# Patient Record
Sex: Female | Born: 1980
Health system: Southern US, Community
[De-identification: ages and names within clinical notes are randomized; demographics above are authoritative.]

## PROBLEM LIST (undated history)

## (undated) DIAGNOSIS — F99 Mental disorder, not otherwise specified: Secondary | ICD-10-CM

## (undated) DIAGNOSIS — N200 Calculus of kidney: Secondary | ICD-10-CM

## (undated) DIAGNOSIS — I10 Essential (primary) hypertension: Secondary | ICD-10-CM

## (undated) DIAGNOSIS — E739 Lactose intolerance, unspecified: Secondary | ICD-10-CM

## (undated) DIAGNOSIS — E119 Type 2 diabetes mellitus without complications: Secondary | ICD-10-CM

## (undated) DIAGNOSIS — F319 Bipolar disorder, unspecified: Secondary | ICD-10-CM

## (undated) DIAGNOSIS — A599 Trichomoniasis, unspecified: Secondary | ICD-10-CM

## (undated) DIAGNOSIS — F209 Schizophrenia, unspecified: Secondary | ICD-10-CM

## (undated) DIAGNOSIS — F329 Major depressive disorder, single episode, unspecified: Secondary | ICD-10-CM

## (undated) DIAGNOSIS — Z6841 Body Mass Index (BMI) 40.0 and over, adult: Secondary | ICD-10-CM

## (undated) DIAGNOSIS — L98499 Non-pressure chronic ulcer of skin of other sites with unspecified severity: Secondary | ICD-10-CM

## (undated) DIAGNOSIS — T8859XA Other complications of anesthesia, initial encounter: Secondary | ICD-10-CM

## (undated) DIAGNOSIS — F32A Depression, unspecified: Secondary | ICD-10-CM

## (undated) DIAGNOSIS — M1990 Unspecified osteoarthritis, unspecified site: Secondary | ICD-10-CM

## (undated) DIAGNOSIS — G4730 Sleep apnea, unspecified: Secondary | ICD-10-CM

## (undated) DIAGNOSIS — Z8489 Family history of other specified conditions: Secondary | ICD-10-CM

## (undated) DIAGNOSIS — R51 Headache: Secondary | ICD-10-CM

## (undated) DIAGNOSIS — G43909 Migraine, unspecified, not intractable, without status migrainosus: Secondary | ICD-10-CM

## (undated) DIAGNOSIS — T4145XA Adverse effect of unspecified anesthetic, initial encounter: Secondary | ICD-10-CM

## (undated) DIAGNOSIS — G935 Compression of brain: Secondary | ICD-10-CM

## (undated) DIAGNOSIS — K219 Gastro-esophageal reflux disease without esophagitis: Secondary | ICD-10-CM

## (undated) DIAGNOSIS — D219 Benign neoplasm of connective and other soft tissue, unspecified: Secondary | ICD-10-CM

## (undated) DIAGNOSIS — K829 Disease of gallbladder, unspecified: Secondary | ICD-10-CM

## (undated) DIAGNOSIS — D649 Anemia, unspecified: Secondary | ICD-10-CM

## (undated) HISTORY — DX: Trichomoniasis, unspecified: A59.9

## (undated) HISTORY — DX: Type 2 diabetes mellitus without complications: E11.9

## (undated) HISTORY — DX: Sleep apnea, unspecified: G47.30

## (undated) HISTORY — PX: OTHER SURGICAL HISTORY: SHX169

## (undated) HISTORY — DX: Migraine, unspecified, not intractable, without status migrainosus: G43.909

## (undated) HISTORY — DX: Compression of brain: G93.5

## (undated) HISTORY — DX: Disease of gallbladder, unspecified: K82.9

## (undated) HISTORY — DX: Unspecified osteoarthritis, unspecified site: M19.90

## (undated) HISTORY — DX: Non-pressure chronic ulcer of skin of other sites with unspecified severity: L98.499

## (undated) HISTORY — DX: Lactose intolerance, unspecified: E73.9

## (undated) HISTORY — DX: Gastro-esophageal reflux disease without esophagitis: K21.9

## (undated) HISTORY — DX: Morbid (severe) obesity due to excess calories: E66.01

## (undated) HISTORY — DX: Body Mass Index (BMI) 40.0 and over, adult: Z684

---
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
## 2001-06-05 ENCOUNTER — Emergency Department (HOSPITAL_COMMUNITY): Admission: EM | Admit: 2001-06-05 | Discharge: 2001-06-05 | Payer: Self-pay | Admitting: Emergency Medicine

## 2001-08-18 ENCOUNTER — Emergency Department (HOSPITAL_COMMUNITY): Admission: EM | Admit: 2001-08-18 | Discharge: 2001-08-18 | Payer: Self-pay | Admitting: Emergency Medicine

## 2001-11-02 ENCOUNTER — Other Ambulatory Visit: Admission: RE | Admit: 2001-11-02 | Discharge: 2001-11-02 | Payer: Self-pay | Admitting: Family Medicine

## 2001-12-29 HISTORY — PX: MULTIPLE TOOTH EXTRACTIONS: SHX2053

## 2003-04-28 ENCOUNTER — Emergency Department (HOSPITAL_COMMUNITY): Admission: EM | Admit: 2003-04-28 | Discharge: 2003-04-28 | Payer: Self-pay | Admitting: Emergency Medicine

## 2004-02-01 ENCOUNTER — Other Ambulatory Visit: Admission: RE | Admit: 2004-02-01 | Discharge: 2004-02-01 | Payer: Self-pay | Admitting: Family Medicine

## 2004-05-10 ENCOUNTER — Encounter: Admission: RE | Admit: 2004-05-10 | Discharge: 2004-05-10 | Payer: Self-pay | Admitting: Family Medicine

## 2004-05-10 ENCOUNTER — Encounter (INDEPENDENT_AMBULATORY_CARE_PROVIDER_SITE_OTHER): Payer: Self-pay | Admitting: Specialist

## 2004-05-10 ENCOUNTER — Other Ambulatory Visit: Admission: RE | Admit: 2004-05-10 | Discharge: 2004-05-10 | Payer: Self-pay | Admitting: Family Medicine

## 2004-05-24 ENCOUNTER — Encounter: Admission: RE | Admit: 2004-05-24 | Discharge: 2004-05-24 | Payer: Self-pay | Admitting: Obstetrics and Gynecology

## 2004-06-29 ENCOUNTER — Emergency Department (HOSPITAL_COMMUNITY): Admission: EM | Admit: 2004-06-29 | Discharge: 2004-06-29 | Payer: Self-pay | Admitting: Emergency Medicine

## 2004-07-03 ENCOUNTER — Emergency Department (HOSPITAL_COMMUNITY): Admission: EM | Admit: 2004-07-03 | Discharge: 2004-07-03 | Payer: Self-pay | Admitting: Emergency Medicine

## 2004-09-06 ENCOUNTER — Ambulatory Visit: Payer: Self-pay | Admitting: Obstetrics and Gynecology

## 2004-10-14 ENCOUNTER — Emergency Department (HOSPITAL_COMMUNITY): Admission: EM | Admit: 2004-10-14 | Discharge: 2004-10-14 | Payer: Self-pay | Admitting: Emergency Medicine

## 2004-11-07 ENCOUNTER — Ambulatory Visit: Payer: Self-pay | Admitting: Obstetrics and Gynecology

## 2004-12-25 ENCOUNTER — Emergency Department (HOSPITAL_COMMUNITY): Admission: EM | Admit: 2004-12-25 | Discharge: 2004-12-25 | Payer: Self-pay | Admitting: Emergency Medicine

## 2005-01-21 ENCOUNTER — Inpatient Hospital Stay (HOSPITAL_COMMUNITY): Admission: AD | Admit: 2005-01-21 | Discharge: 2005-01-21 | Payer: Self-pay | Admitting: *Deleted

## 2005-02-07 ENCOUNTER — Inpatient Hospital Stay (HOSPITAL_COMMUNITY): Admission: AD | Admit: 2005-02-07 | Discharge: 2005-02-08 | Payer: Self-pay | Admitting: Family Medicine

## 2005-03-08 ENCOUNTER — Inpatient Hospital Stay (HOSPITAL_COMMUNITY): Admission: AD | Admit: 2005-03-08 | Discharge: 2005-03-09 | Payer: Self-pay | Admitting: Obstetrics and Gynecology

## 2005-04-07 ENCOUNTER — Ambulatory Visit (HOSPITAL_COMMUNITY): Admission: RE | Admit: 2005-04-07 | Discharge: 2005-04-07 | Payer: Self-pay | Admitting: Obstetrics & Gynecology

## 2005-04-16 ENCOUNTER — Observation Stay (HOSPITAL_COMMUNITY): Admission: AD | Admit: 2005-04-16 | Discharge: 2005-04-17 | Payer: Self-pay | Admitting: Obstetrics & Gynecology

## 2005-06-06 ENCOUNTER — Inpatient Hospital Stay (HOSPITAL_COMMUNITY): Admission: AD | Admit: 2005-06-06 | Discharge: 2005-06-06 | Payer: Self-pay | Admitting: Obstetrics

## 2005-06-16 ENCOUNTER — Inpatient Hospital Stay (HOSPITAL_COMMUNITY): Admission: AD | Admit: 2005-06-16 | Discharge: 2005-06-16 | Payer: Self-pay | Admitting: Obstetrics

## 2005-06-16 ENCOUNTER — Inpatient Hospital Stay (HOSPITAL_COMMUNITY): Admission: AD | Admit: 2005-06-16 | Discharge: 2005-06-20 | Payer: Self-pay | Admitting: Obstetrics

## 2005-08-29 ENCOUNTER — Inpatient Hospital Stay (HOSPITAL_COMMUNITY): Admission: AD | Admit: 2005-08-29 | Discharge: 2005-08-29 | Payer: Self-pay | Admitting: Obstetrics & Gynecology

## 2005-08-31 ENCOUNTER — Inpatient Hospital Stay (HOSPITAL_COMMUNITY): Admission: AD | Admit: 2005-08-31 | Discharge: 2005-08-31 | Payer: Self-pay | Admitting: Obstetrics & Gynecology

## 2005-09-05 ENCOUNTER — Inpatient Hospital Stay (HOSPITAL_COMMUNITY): Admission: AD | Admit: 2005-09-05 | Discharge: 2005-09-09 | Payer: Self-pay | Admitting: Obstetrics & Gynecology

## 2005-09-12 ENCOUNTER — Inpatient Hospital Stay (HOSPITAL_COMMUNITY): Admission: AD | Admit: 2005-09-12 | Discharge: 2005-09-12 | Payer: Self-pay | Admitting: Obstetrics & Gynecology

## 2007-02-24 ENCOUNTER — Emergency Department (HOSPITAL_COMMUNITY): Admission: EM | Admit: 2007-02-24 | Discharge: 2007-02-24 | Payer: Self-pay | Admitting: Emergency Medicine

## 2007-02-26 ENCOUNTER — Emergency Department (HOSPITAL_COMMUNITY): Admission: EM | Admit: 2007-02-26 | Discharge: 2007-02-27 | Payer: Self-pay | Admitting: Emergency Medicine

## 2007-03-01 ENCOUNTER — Emergency Department (HOSPITAL_COMMUNITY): Admission: EM | Admit: 2007-03-01 | Discharge: 2007-03-01 | Payer: Self-pay | Admitting: Family Medicine

## 2007-12-16 ENCOUNTER — Emergency Department (HOSPITAL_COMMUNITY): Admission: EM | Admit: 2007-12-16 | Discharge: 2007-12-16 | Payer: Self-pay | Admitting: Emergency Medicine

## 2008-01-14 ENCOUNTER — Emergency Department (HOSPITAL_COMMUNITY): Admission: EM | Admit: 2008-01-14 | Discharge: 2008-01-14 | Payer: Self-pay | Admitting: *Deleted

## 2008-12-29 DIAGNOSIS — N200 Calculus of kidney: Secondary | ICD-10-CM

## 2008-12-29 HISTORY — DX: Calculus of kidney: N20.0

## 2009-02-13 ENCOUNTER — Emergency Department (HOSPITAL_COMMUNITY): Admission: EM | Admit: 2009-02-13 | Discharge: 2009-02-13 | Payer: Self-pay | Admitting: Emergency Medicine

## 2009-02-22 ENCOUNTER — Emergency Department (HOSPITAL_COMMUNITY): Admission: EM | Admit: 2009-02-22 | Discharge: 2009-02-23 | Payer: Self-pay | Admitting: Emergency Medicine

## 2009-06-13 ENCOUNTER — Emergency Department (HOSPITAL_COMMUNITY): Admission: EM | Admit: 2009-06-13 | Discharge: 2009-06-13 | Payer: Self-pay | Admitting: Family Medicine

## 2009-07-09 ENCOUNTER — Emergency Department (HOSPITAL_COMMUNITY): Admission: EM | Admit: 2009-07-09 | Discharge: 2009-07-09 | Payer: Self-pay | Admitting: Emergency Medicine

## 2009-07-11 ENCOUNTER — Emergency Department (HOSPITAL_COMMUNITY): Admission: EM | Admit: 2009-07-11 | Discharge: 2009-07-12 | Payer: Self-pay | Admitting: Emergency Medicine

## 2009-09-24 ENCOUNTER — Emergency Department (HOSPITAL_COMMUNITY): Admission: EM | Admit: 2009-09-24 | Discharge: 2009-09-24 | Payer: Self-pay | Admitting: Emergency Medicine

## 2009-09-25 ENCOUNTER — Emergency Department (HOSPITAL_BASED_OUTPATIENT_CLINIC_OR_DEPARTMENT_OTHER): Admission: EM | Admit: 2009-09-25 | Discharge: 2009-09-25 | Payer: Self-pay | Admitting: Emergency Medicine

## 2009-09-25 ENCOUNTER — Ambulatory Visit: Payer: Self-pay | Admitting: Diagnostic Radiology

## 2009-12-25 ENCOUNTER — Ambulatory Visit (HOSPITAL_BASED_OUTPATIENT_CLINIC_OR_DEPARTMENT_OTHER): Admission: RE | Admit: 2009-12-25 | Discharge: 2009-12-25 | Payer: Self-pay | Admitting: General Surgery

## 2009-12-29 HISTORY — PX: TUBAL LIGATION: SHX77

## 2010-04-16 ENCOUNTER — Emergency Department (HOSPITAL_COMMUNITY): Admission: EM | Admit: 2010-04-16 | Discharge: 2010-04-16 | Payer: Self-pay | Admitting: Emergency Medicine

## 2010-04-16 ENCOUNTER — Inpatient Hospital Stay (HOSPITAL_COMMUNITY): Admission: AD | Admit: 2010-04-16 | Discharge: 2010-04-17 | Payer: Self-pay | Admitting: Obstetrics & Gynecology

## 2010-04-24 ENCOUNTER — Encounter: Payer: Self-pay | Admitting: Family Medicine

## 2010-04-24 ENCOUNTER — Inpatient Hospital Stay (HOSPITAL_COMMUNITY): Admission: AD | Admit: 2010-04-24 | Discharge: 2010-04-24 | Payer: Self-pay | Admitting: Family Medicine

## 2010-06-18 ENCOUNTER — Ambulatory Visit (HOSPITAL_COMMUNITY): Admission: RE | Admit: 2010-06-18 | Discharge: 2010-06-18 | Payer: Self-pay | Admitting: Obstetrics and Gynecology

## 2010-07-02 ENCOUNTER — Ambulatory Visit (HOSPITAL_COMMUNITY): Admission: RE | Admit: 2010-07-02 | Discharge: 2010-07-02 | Payer: Self-pay | Admitting: Obstetrics and Gynecology

## 2010-07-04 ENCOUNTER — Inpatient Hospital Stay (HOSPITAL_COMMUNITY): Admission: AD | Admit: 2010-07-04 | Discharge: 2010-07-04 | Payer: Self-pay | Admitting: Obstetrics and Gynecology

## 2010-07-04 ENCOUNTER — Ambulatory Visit: Payer: Self-pay | Admitting: Physician Assistant

## 2010-08-14 ENCOUNTER — Ambulatory Visit (HOSPITAL_COMMUNITY): Admission: RE | Admit: 2010-08-14 | Discharge: 2010-08-14 | Payer: Self-pay | Admitting: Obstetrics and Gynecology

## 2010-09-10 ENCOUNTER — Inpatient Hospital Stay (HOSPITAL_COMMUNITY): Admission: AD | Admit: 2010-09-10 | Discharge: 2010-09-10 | Payer: Self-pay | Admitting: Obstetrics and Gynecology

## 2010-09-11 ENCOUNTER — Ambulatory Visit (HOSPITAL_COMMUNITY): Admission: RE | Admit: 2010-09-11 | Discharge: 2010-09-11 | Payer: Self-pay | Admitting: Obstetrics and Gynecology

## 2010-10-01 ENCOUNTER — Inpatient Hospital Stay (HOSPITAL_COMMUNITY): Admission: AD | Admit: 2010-10-01 | Discharge: 2010-10-01 | Payer: Self-pay | Admitting: Obstetrics and Gynecology

## 2010-10-01 ENCOUNTER — Ambulatory Visit: Payer: Self-pay | Admitting: Gynecology

## 2010-12-13 ENCOUNTER — Encounter (INDEPENDENT_AMBULATORY_CARE_PROVIDER_SITE_OTHER): Payer: Self-pay | Admitting: Obstetrics and Gynecology

## 2010-12-13 ENCOUNTER — Inpatient Hospital Stay (HOSPITAL_COMMUNITY)
Admission: RE | Admit: 2010-12-13 | Discharge: 2010-12-16 | Payer: Self-pay | Source: Home / Self Care | Attending: Obstetrics and Gynecology | Admitting: Obstetrics and Gynecology

## 2011-01-19 ENCOUNTER — Encounter: Payer: Self-pay | Admitting: Obstetrics & Gynecology

## 2011-03-10 LAB — RPR: RPR Ser Ql: NONREACTIVE

## 2011-03-10 LAB — BASIC METABOLIC PANEL
CO2: 24 mEq/L (ref 19–32)
Chloride: 105 mEq/L (ref 96–112)
Creatinine, Ser: 0.61 mg/dL (ref 0.4–1.2)
GFR calc Af Amer: >60 mL/min (ref >60)
Potassium: 3.5 mEq/L (ref 3.5–5.1)
Sodium: 138 mEq/L (ref 135–145)

## 2011-03-10 LAB — CBC
HCT: 34.3 % — ABNORMAL LOW (ref 36.0–46.0)
MCH: 30.6 pg (ref 26.0–34.0)
MCHC: 33.4 g/dL (ref 30.0–36.0)
MCV: 88.9 fL (ref 78.0–100.0)
MCV: 91.5 fL (ref 78.0–100.0)
Platelets: 261 10*3/uL (ref 150–400)
RBC: 3.33 MIL/uL — ABNORMAL LOW (ref 3.87–5.11)
RBC: 3.75 MIL/uL — ABNORMAL LOW (ref 3.87–5.11)
RDW: 14.6 % (ref 11.5–15.5)
WBC: 8.6 10*3/uL (ref 4.0–10.5)

## 2011-03-10 LAB — SURGICAL PCR SCREEN: Staphylococcus aureus: POSITIVE — AB

## 2011-03-12 LAB — URINALYSIS, ROUTINE W REFLEX MICROSCOPIC
Glucose, UA: NEGATIVE mg/dL
Hgb urine dipstick: NEGATIVE
Protein, ur: NEGATIVE mg/dL
Specific Gravity, Urine: 1.025 (ref 1.005–1.030)
pH: 6 (ref 5.0–8.0)

## 2011-03-13 LAB — URINALYSIS, ROUTINE W REFLEX MICROSCOPIC
Bilirubin Urine: NEGATIVE
Hgb urine dipstick: NEGATIVE
Ketones, ur: 15 mg/dL — AB
Specific Gravity, Urine: 1.02 (ref 1.005–1.030)
Urobilinogen, UA: 1 mg/dL (ref 0.0–1.0)

## 2011-03-13 LAB — WET PREP, GENITAL
Trich, Wet Prep: NONE SEEN
Yeast Wet Prep HPF POC: NONE SEEN

## 2011-03-16 LAB — URINALYSIS, ROUTINE W REFLEX MICROSCOPIC
Bilirubin Urine: NEGATIVE
Glucose, UA: NEGATIVE mg/dL
Hgb urine dipstick: NEGATIVE
Protein, ur: NEGATIVE mg/dL
Specific Gravity, Urine: 1.02 (ref 1.005–1.030)

## 2011-03-18 LAB — CBC
Hemoglobin: 12.6 g/dL (ref 12.0–15.0)
MCHC: 34.1 g/dL (ref 30.0–36.0)
MCHC: 34.3 g/dL (ref 30.0–36.0)
Platelets: 348 10*3/uL (ref 150–400)
RDW: 14.8 % (ref 11.5–15.5)
RDW: 14.4 % (ref 11.5–15.5)

## 2011-03-18 LAB — URINALYSIS, ROUTINE W REFLEX MICROSCOPIC
Bilirubin Urine: NEGATIVE
Hgb urine dipstick: NEGATIVE
Ketones, ur: NEGATIVE mg/dL
Nitrite: NEGATIVE
Protein, ur: NEGATIVE mg/dL
Protein, ur: NEGATIVE mg/dL
Specific Gravity, Urine: 1.025 (ref 1.005–1.030)
Urobilinogen, UA: 0.2 mg/dL (ref 0.0–1.0)
Urobilinogen, UA: 0.2 mg/dL (ref 0.0–1.0)

## 2011-03-18 LAB — DIFFERENTIAL
Basophils Absolute: 0 10*3/uL (ref 0.0–0.1)
Basophils Relative: 0 % (ref 0–1)
Eosinophils Relative: 1 % (ref 0–5)
Monocytes Absolute: 0.5 10*3/uL (ref 0.1–1.0)
Neutro Abs: 7.4 10*3/uL (ref 1.7–7.7)

## 2011-03-18 LAB — BASIC METABOLIC PANEL
CO2: 27 mEq/L (ref 19–32)
Calcium: 9.1 mg/dL (ref 8.4–10.5)
Glucose, Bld: 85 mg/dL (ref 70–99)
Sodium: 136 mEq/L (ref 135–145)

## 2011-03-18 LAB — WET PREP, GENITAL
Trich, Wet Prep: NONE SEEN
Yeast Wet Prep HPF POC: NONE SEEN

## 2011-03-18 LAB — HCG, QUANTITATIVE, PREGNANCY: hCG, Beta Chain, Quant, S: 6347 m[IU]/mL — ABNORMAL HIGH (ref <5)

## 2011-03-18 LAB — GC/CHLAMYDIA PROBE AMP, GENITAL: GC Probe Amp, Genital: NEGATIVE

## 2011-03-31 LAB — POCT HEMOGLOBIN-HEMACUE: Hemoglobin: 11.3 g/dL — ABNORMAL LOW (ref 12.0–15.0)

## 2011-04-06 LAB — DIFFERENTIAL
Basophils Absolute: 0 10*3/uL (ref 0.0–0.1)
Eosinophils Relative: 4 % (ref 0–5)
Monocytes Absolute: 1.2 10*3/uL — ABNORMAL HIGH (ref 0.1–1.0)
Monocytes Relative: 9 % (ref 3–12)
Neutrophils Relative %: 63 % (ref 43–77)

## 2011-04-06 LAB — CBC
HCT: 38.3 % (ref 36.0–46.0)
Hemoglobin: 12.9 g/dL (ref 12.0–15.0)
MCHC: 33.5 g/dL (ref 30.0–36.0)
MCV: 91.1 fL (ref 78.0–100.0)
RBC: 4.21 MIL/uL (ref 3.87–5.11)

## 2011-04-06 LAB — URINALYSIS, ROUTINE W REFLEX MICROSCOPIC
Bilirubin Urine: NEGATIVE
Hgb urine dipstick: NEGATIVE
Specific Gravity, Urine: 1.031 — ABNORMAL HIGH (ref 1.005–1.030)
Urobilinogen, UA: 1 mg/dL (ref 0.0–1.0)
pH: 6.5 (ref 5.0–8.0)

## 2011-04-06 LAB — COMPREHENSIVE METABOLIC PANEL
BUN: 8 mg/dL (ref 6–23)
CO2: 21 mEq/L (ref 19–32)
Calcium: 9 mg/dL (ref 8.4–10.5)
Creatinine, Ser: 0.81 mg/dL (ref 0.4–1.2)
GFR calc non Af Amer: >60 mL/min (ref >60)
Glucose, Bld: 86 mg/dL (ref 70–99)

## 2011-04-06 LAB — LIPASE, BLOOD: Lipase: 19 U/L (ref 11–59)

## 2011-04-15 LAB — BASIC METABOLIC PANEL
CO2: 27 mEq/L (ref 19–32)
Chloride: 102 mEq/L (ref 96–112)
GFR calc Af Amer: >60 mL/min (ref >60)
Glucose, Bld: 71 mg/dL (ref 70–99)
Sodium: 136 mEq/L (ref 135–145)

## 2011-04-15 LAB — DIFFERENTIAL
Basophils Absolute: 0.1 10*3/uL (ref 0.0–0.1)
Basophils Relative: 1 % (ref 0–1)
Eosinophils Absolute: 0.3 10*3/uL (ref 0.0–0.7)
Eosinophils Relative: 3 % (ref 0–5)
Monocytes Absolute: 0.7 10*3/uL (ref 0.1–1.0)
Monocytes Relative: 7 % (ref 3–12)

## 2011-04-15 LAB — URINALYSIS, ROUTINE W REFLEX MICROSCOPIC
Glucose, UA: NEGATIVE mg/dL
Ketones, ur: NEGATIVE mg/dL
Nitrite: NEGATIVE
Protein, ur: NEGATIVE mg/dL
pH: 7 (ref 5.0–8.0)

## 2011-04-15 LAB — CBC
HCT: 43.6 % (ref 36.0–46.0)
Hemoglobin: 14.8 g/dL (ref 12.0–15.0)
MCHC: 33.8 g/dL (ref 30.0–36.0)
MCV: 92.2 fL (ref 78.0–100.0)
RBC: 4.73 MIL/uL (ref 3.87–5.11)
RDW: 14 % (ref 11.5–15.5)

## 2011-04-15 LAB — WET PREP, GENITAL
Clue Cells Wet Prep HPF POC: NONE SEEN
Trich, Wet Prep: NONE SEEN
WBC, Wet Prep HPF POC: NONE SEEN

## 2011-04-15 LAB — URINE MICROSCOPIC-ADD ON

## 2011-04-15 LAB — POCT PREGNANCY, URINE: Preg Test, Ur: POSITIVE

## 2011-04-15 LAB — GC/CHLAMYDIA PROBE AMP, GENITAL: Chlamydia, DNA Probe: NEGATIVE

## 2011-05-16 NOTE — Op Note (Signed)
Melinda Ruiz, Melinda Ruiz            ACCOUNT NO.:  [id number]   MEDICAL RECORD NO.:  [id number]          PATIENT TYPE:  INP   LOCATION:  9143                          FACILITY:  [facility]   PHYSICIAN:  Kathreen Cosier, M.D.DATE OF BIRTH:  [birth year]   DATE OF PROCEDURE:  09/06/2005  DATE OF DISCHARGE:                                 OPERATIVE REPORT   PREOPERATIVE DIAGNOSIS:  Failure to progress in labor.   SURGEON:  Kathreen Cosier, M.D.   ANESTHESIA:  Epidural.   PROCEDURE:  The patient was placed on the operating room table in the supine  position, abdomen prepped and draped, bladder emptied with a Foley catheter.  A transverse suprapubic incision made, carried through the rectus fascia.  The fascia was cleaned and incised the length of the incision.  The rectus  muscles were retracted laterally.  Peritoneum was incised longitudinally.  A  transverse incision was made in the viscera of the peritoneum above the  bladder, and bladder mobilized inferiorly.  A transverse lower intrauterine  incision was made, and the patient delivered, from an OP position, a female,  Apgar 8 and 9, weighing 7 pounds 6 ounces.  Placenta was anterior, was  removed manually.  The uterine cavity cleaned with dry laps, and uterine  incision closed with one layer of continuous suture of #1 chromic.  Hemostasis was satisfactory with #2 chromic.  The uterus was well  contracted, tubes and ovaries normal.  The abdomen closed in layers.  Peritoneum with continuous suture of 0 chromic, fascia was sutured with  Dexon, skin closed with subcuticular stitch of 4-0 Monocryl.   ESTIMATED BLOOD LOSS:  500 mL.           ______________________________  Kathreen Cosier, M.D.     BAM/MEDQ  D:  09/06/2005  T:  09/06/2005  Job:  [id number]

## 2011-05-16 NOTE — Discharge Summary (Signed)
NAMEPRESTON, Melinda Ruiz            ACCOUNT NO.:  [id number]   MEDICAL RECORD NO.:  [id number]          PATIENT TYPE:  INP   LOCATION:  9143                          [facility]   PHYSICIAN:  Charles A. Clearance Coots, M.D.DATE OF BIRTH:  10-03-1981   DATE OF ADMISSION:  09/05/2005  DATE OF DISCHARGE:  09/09/2005                                 DISCHARGE SUMMARY   The patient was admitted on September 05, 2005, in active labor.  At time of  admission the patient was determined to be full-term with intrauterine  pregnancy and dilated 6-7 cm, 100%, vertex at a -1, and artificial rupture  of membranes was performed by Dr. Francoise Ceo with clear fluid being  expressed after rupture of membranes.  After several hours after admission  the patient did not progress past 6 cm, and primary C-section was performed  by Dr. Francoise Ceo with a low transverse incision for failure to  progress.  The patient's hospital stay was uneventful.  Postoperative day  #1, the patient was found to have normal vital signs, abdomen soft, legs  were reactive.  Postoperative day #2, again vital signs were stable and the  patient was afebrile.  The fundus was appropriately tender after C-section  and firm.  The incision was clean, dry, and intact.  The patient was deemed  to be doing well.  The patient was discharged on hospital day #3.  Date of  discharge was September 09, 2005.  The patient had no complaints and desired  discharge home.  The patient's vital signs were stable and the patient was  afebrile.  Physical exam was nonremarkable.  Uterus was firm and nontender.  Incision sutures were clean, dry, and intact.  The patient had moderate  vaginal drainage, appropriate, and extremities were found to be negative  Homans sign and reactive.   DISCHARGE DIAGNOSIS:  Three days status post primary cesarean section for  failure to progress.   The patient was discharged home with infant on hospital day #3.  Prescriptions were given for ibuprofen and Percocet for p.r.n. use.  Engorgement and infection precautions were reviewed with the patient before  discharge, and the patient was instructed to follow up two weeks after  discharge for incision check at the [facility].      Maylon Cos, C.N.M.      Charles A. Clearance Coots, M.D.  Electronically Signed    SS/MEDQ  D:  10/17/2005  T:  10/17/2005  Job:  [id number]

## 2011-05-16 NOTE — Discharge Summary (Signed)
NAMEDUANE, Melinda Ruiz            ACCOUNT NO.:  [id number]   MEDICAL RECORD NO.:  [id number]          PATIENT TYPE:  INP   LOCATION:  9156                          [facility]   PHYSICIAN:  Charles A. Clearance Coots, M.D.DATE OF BIRTH:  05-26-1981   DATE OF ADMISSION:  06/16/2005  DATE OF DISCHARGE:  06/20/2005                                 DISCHARGE SUMMARY   ADMISSION DIAGNOSES:  1.  Intrauterine pregnancy at 28 weeks' gestation.  2.  Preterm cervical changes.   DISCHARGE DIAGNOSES:  1.  Intrauterine pregnancy at 28 weeks' gestation.  2.  Preterm cervical changes, much improved after intravenous tocolysis and      intravenous antibiotic therapy.   DISPOSITION:  Discharged to home, undelivered at 43 weeks' gestation, in  good condition.   REASON FOR ADMISSION:  A 30 year old, African-American female, G1 with  estimated date of confinement of September 08, 2005, presented with a  complaint of increased back pain and tightening overnight.  The morning of  her presentation, the patient complained of brownish vaginal discharge.   PAST SURGICAL HISTORY:  1.  Pilonidal cyst.  2.  Resection of boils under arm.   PAST MEDICAL HISTORY:  None.   MEDICATIONS:  Prenatal vitamins.   ALLERGIES:  PENICILLIN causes a rash.   SOCIAL HISTORY:  Single.  Negative for tobacco, alcohol or recreational drug  use.   PHYSICAL EXAMINATION:  GENERAL:  Well-developed, well-nourished female in no  acute distress.  VITAL SIGNS:  Temperature 97.8, blood pressure 112/74.  LUNGS:  Clear to auscultation bilaterally.  HEART:  Regular rate and rhythm.  ABDOMEN:  Gravid, nontender.  PELVIC:  Brownish mucus discharge on speculum exam in vaginal vault from  cervical os.  On bimanual exam, the cervix is closed, soft, 50%, vertex at a  -2 station.   IMPRESSION:  Intrauterine pregnancy at 79 weeks' gestation with preterm  cervical changes.   PLAN:  Admit for bed rest.  IV tocolysis.   LABORATORY DATA  AND X-RAY FINDINGS:  Hemoglobin 13.5, hematocrit 40, white  blood cell count 9900, platelets 323,000.  Comprehensive metabolic panel was  within normal limits.  Urinalysis revealed 3+ ketones and trace blood.   HOSPITAL COURSE:  The patient was admitted and started on IV tocolysis with  magnesium sulfate and IV antibiotic therapy.  She responded well to therapy.  Ultrasound on hospital day #3, revealed cervical length of 4 cm  transabdominally.  Amniotic fluid was normal and appropriate growth.  The  patient continued to do well during her hospitalization and was therefore  discharged to home on hospital day #4, much improved, undelivered at 28  weeks' gestation.   DISCHARGE LABORATORY DATA AND X-RAY FINDINGS:  Hemoglobin 12, hematocrit 36,  white blood cell count 8300, platelets 311,000.  Fetal fibronectin test was  negative.  Group B Streptococcus culture was positive.   DISCHARGE MEDICATIONS:  Continue prenatal vitamins.   SPECIAL INSTRUCTIONS:  Routine written instructions were given for preterm  labor precautions.  The patient is to call the office for a follow-up  appointment in 1 week.  CAH/MEDQ  D:  07/15/2005  T:  07/15/2005  Job:  [id number]

## 2011-05-16 NOTE — H&P (Signed)
Melinda Ruiz, Melinda Ruiz            ACCOUNT NO.:  [id number]   MEDICAL RECORD NO.:  [id number]          PATIENT TYPE:  INP   LOCATION:  9143                          [facility]ILITY[facility]   PHYSICIAN:  Kathreen Cosier, M.D.DATE OF BIRTH:  1981-02-14   DATE OF ADMISSION:  09/05/2005  DATE OF DISCHARGE:                                HISTORY & PHYSICAL   The patient is a 30 year old primigravida, [facility] September 08, 2005, positive  GBS, _________ labor and by 4:10 a.m. September 06, 2005, she was __________.  Membranes were ruptured.  The fluid was clear.  ___________.  She also  __________ GBS.  The patient was in good labor and by 11:50 a.m. her cervix  had not changed.  She ___________.  It was decided she would be delivered by  C-section for failure to progress __________.   PHYSICAL EXAMINATION:  GENERAL:  Revealed an obese female in labor.  HEENT:  Negative.  LUNGS:  Clear.  HEART:  Regular rhythm without gallops.  ABDOMEN:  Term.  Estimated fetal weight 8 pounds.  BREASTS:  ___________.  EXTREMITIES:  Negative.           ______________________________  Kathreen Cosier, M.D.     BAM/MEDQ  D:  09/06/2005  T:  09/06/2005  Job:  [id number]

## 2011-05-24 ENCOUNTER — Emergency Department (HOSPITAL_COMMUNITY)
Admission: EM | Admit: 2011-05-24 | Discharge: 2011-05-24 | Disposition: A | Discharge status: Home / Self Care | Payer: Medicare Other | Attending: Emergency Medicine | Admitting: Emergency Medicine

## 2011-05-24 ENCOUNTER — Emergency Department (HOSPITAL_COMMUNITY): Payer: Medicare Other

## 2011-05-24 DIAGNOSIS — Y92410 Unspecified street and highway as the place of occurrence of the external cause: Secondary | ICD-10-CM | POA: Insufficient documentation

## 2011-05-24 DIAGNOSIS — R071 Chest pain on breathing: Secondary | ICD-10-CM | POA: Insufficient documentation

## 2011-05-24 DIAGNOSIS — S0990XA Unspecified injury of head, initial encounter: Secondary | ICD-10-CM | POA: Insufficient documentation

## 2011-05-24 DIAGNOSIS — S139XXA Sprain of joints and ligaments of unspecified parts of neck, initial encounter: Secondary | ICD-10-CM | POA: Insufficient documentation

## 2011-05-24 DIAGNOSIS — R51 Headache: Secondary | ICD-10-CM | POA: Insufficient documentation

## 2011-05-24 DIAGNOSIS — I10 Essential (primary) hypertension: Secondary | ICD-10-CM | POA: Insufficient documentation

## 2011-06-29 ENCOUNTER — Emergency Department (HOSPITAL_COMMUNITY)
Admission: EM | Admit: 2011-06-29 | Discharge: 2011-06-29 | Disposition: A | Discharge status: Home / Self Care | Payer: Medicare Other | Attending: Emergency Medicine | Admitting: Emergency Medicine

## 2011-06-29 ENCOUNTER — Emergency Department (HOSPITAL_COMMUNITY): Payer: Medicare Other

## 2011-06-29 DIAGNOSIS — R079 Chest pain, unspecified: Secondary | ICD-10-CM | POA: Insufficient documentation

## 2011-06-29 DIAGNOSIS — I10 Essential (primary) hypertension: Secondary | ICD-10-CM | POA: Insufficient documentation

## 2011-06-29 DIAGNOSIS — R197 Diarrhea, unspecified: Secondary | ICD-10-CM | POA: Insufficient documentation

## 2011-06-29 DIAGNOSIS — R1013 Epigastric pain: Secondary | ICD-10-CM | POA: Insufficient documentation

## 2011-06-29 DIAGNOSIS — R0602 Shortness of breath: Secondary | ICD-10-CM | POA: Insufficient documentation

## 2011-06-29 DIAGNOSIS — K219 Gastro-esophageal reflux disease without esophagitis: Secondary | ICD-10-CM | POA: Insufficient documentation

## 2011-06-29 HISTORY — PX: CHOLECYSTECTOMY: SHX55

## 2011-08-16 ENCOUNTER — Inpatient Hospital Stay (HOSPITAL_COMMUNITY)
Admission: EM | Admit: 2011-08-16 | Discharge: 2011-08-17 | DRG: 419 | Disposition: A | Discharge status: Home / Self Care | Payer: Medicare Other | Attending: General Surgery | Admitting: General Surgery

## 2011-08-16 ENCOUNTER — Emergency Department (HOSPITAL_COMMUNITY): Payer: Medicare Other

## 2011-08-16 ENCOUNTER — Other Ambulatory Visit (INDEPENDENT_AMBULATORY_CARE_PROVIDER_SITE_OTHER): Payer: Self-pay | Admitting: General Surgery

## 2011-08-16 DIAGNOSIS — Z880 Allergy status to penicillin: Secondary | ICD-10-CM

## 2011-08-16 DIAGNOSIS — K8010 Calculus of gallbladder with chronic cholecystitis without obstruction: Secondary | ICD-10-CM

## 2011-08-16 DIAGNOSIS — K810 Acute cholecystitis: Principal | ICD-10-CM | POA: Diagnosis present

## 2011-08-16 LAB — DIFFERENTIAL
Basophils Absolute: 0 10*3/uL (ref 0.0–0.1)
Basophils Relative: 0 % (ref 0–1)
Eosinophils Absolute: 0.2 K/uL (ref 0.0–0.7)
Eosinophils Relative: 1 % (ref 0–5)
Lymphocytes Relative: 32 % (ref 12–46)
Lymphs Abs: 3.9 K/uL (ref 0.7–4.0)
Monocytes Absolute: 0.8 10*3/uL (ref 0.1–1.0)
Monocytes Relative: 6 % (ref 3–12)
Neutro Abs: 7.1 10*3/uL (ref 1.7–7.7)
Neutrophils Relative %: 60 % (ref 43–77)

## 2011-08-16 LAB — URINALYSIS, ROUTINE W REFLEX MICROSCOPIC
Bilirubin Urine: NEGATIVE
Glucose, UA: NEGATIVE mg/dL
Hgb urine dipstick: NEGATIVE
Ketones, ur: NEGATIVE mg/dL
Leukocytes, UA: NEGATIVE
Nitrite: POSITIVE — AB
Protein, ur: NEGATIVE mg/dL
Specific Gravity, Urine: 1.023 (ref 1.005–1.030)
Urobilinogen, UA: 1 mg/dL (ref 0.0–1.0)
pH: 6 (ref 5.0–8.0)

## 2011-08-16 LAB — CBC
HCT: 37.5 % (ref 36.0–46.0)
Hemoglobin: 12.7 g/dL (ref 12.0–15.0)
MCH: 28.4 pg (ref 26.0–34.0)
MCHC: 33.9 g/dL (ref 30.0–36.0)
MCV: 83.9 fL (ref 78.0–100.0)
Platelets: 346 K/uL (ref 150–400)
RBC: 4.47 MIL/uL (ref 3.87–5.11)
RDW: 15.3 % (ref 11.5–15.5)
WBC: 11.9 10*3/uL — ABNORMAL HIGH (ref 4.0–10.5)

## 2011-08-16 LAB — COMPREHENSIVE METABOLIC PANEL WITH GFR
ALT: 12 U/L (ref 0–35)
AST: 11 U/L (ref 0–37)
Albumin: 3.8 g/dL (ref 3.5–5.2)
CO2: 28 meq/L (ref 19–32)
Calcium: 9.5 mg/dL (ref 8.4–10.5)
Creatinine, Ser: 0.77 mg/dL (ref 0.50–1.10)
GFR calc non Af Amer: >60 mL/min (ref >60)
Sodium: 138 meq/L (ref 135–145)
Total Protein: 8 g/dL (ref 6.0–8.3)

## 2011-08-16 LAB — COMPREHENSIVE METABOLIC PANEL
Alkaline Phosphatase: 88 U/L (ref 39–117)
BUN: 11 mg/dL (ref 6–23)
Chloride: 102 mEq/L (ref 96–112)
GFR calc Af Amer: >60 mL/min (ref >60)
Glucose, Bld: 99 mg/dL (ref 70–99)
Potassium: 3.5 mEq/L (ref 3.5–5.1)
Total Bilirubin: 0.1 mg/dL — ABNORMAL LOW (ref 0.3–1.2)

## 2011-08-16 LAB — URINE MICROSCOPIC-ADD ON

## 2011-08-16 LAB — POCT PREGNANCY, URINE: Preg Test, Ur: NEGATIVE

## 2011-08-16 LAB — LIPASE, BLOOD: Lipase: 24 U/L (ref 11–59)

## 2011-08-16 NOTE — Op Note (Signed)
Melinda Ruiz, Melinda Ruiz            ACCOUNT NO.:  [id number]  MEDICAL RECORD NO.:  [id number]  LOCATION:  5006                         FACILITY:  [facility]  PHYSICIAN:  Juanetta Gosling, MDDATE OF BIRTH:  21-Apr-1981  DATE OF PROCEDURE:  08/16/2011 DATE OF DISCHARGE:                              OPERATIVE REPORT   PREOPERATIVE DIAGNOSIS:  Acute cholecystitis.  POSTOPERATIVE DIAGNOSIS:  Acute cholecystitis.  PROCEDURE:  Laparoscopic cholecystectomy.  SURGEON:  Juanetta Gosling, MD  ASSISTANT:  Anselm Pancoast. Zachery Dakins, MD  ANESTHESIA:  General.  SUPERVISING ANESTHESIOLOGIST:  Quita Skye. Krista Blue, MD  SPECIMENS:  Gallbladder and contents to Pathology.  ESTIMATED BLOOD LOSS:  Minimal.  COMPLICATIONS:  None.  DRAINS:  None.  DISPOSITION:  To recovery room in stable condition.  INDICATIONS:  This is a 30 year old female with a new onset of right upper quadrant pain.  On exam, she has a Murphy'S sign and elevated white blood cell count and an ultrasound with stones and a sonographic Murphy'S sign.  I discussed with her laparoscopic cholecystectomy for acute cholecystitis and the risks and benefits associated with that.  PROCEDURE:  After informed consent was obtained, the patient was taken to the operating room.  She was administered intravenous ciprofloxacin due to penicillin allergy.  She had sequential compression devices placed on her lower extremities prior to induction with anesthesia.  She was then placed under general endotracheal anesthesia without complication.  Her abdomen was then prepped and draped in standard sterile surgical fashion.  A surgical time-out was then performed.  I infiltrated 0.25% Marcaine below her umbilicus.  I made a vertical incision with an 11-blade.  I carried this out down to the fascia.  This was entered sharply.  Her peritoneum was entered bluntly.  I then placed a 0-Vicryl pursestring suture through the fascia.  A Hasson trocar was then  introduced.  Her abdomen was then insufflated to 15 mmHg.  I then placed an additional epigastric port.  Dr. Zachery Dakins placed 2 right upper quadrant ports under direct vision after infiltration of local anesthetic without complication.  Her gallbladder was then retracted cephalad.  She had some adhesions from the duodenum as well as her omentum and it clearly had acute cholecystitis.  These were taken down with a combination of cautery, blunt dissection, and sharp dissection to remove the duodenum from the gallbladder.  Once I had done this, I obtained a critical view of safety very clearly.  Due to the fact that she had normal liver function tests, I did not do a cholangiogram.  I then clipped the duct 3 times, divided this, I treated the artery as well as a posterior branch of the artery in a similar fashion.  The gallbladder was then removed from the liver bed with some difficulty due to the fact that she had a significant amount of acute inflammation. This was then placed in an EndoCatch bag and removed from the umbilicus. Irrigation was performed.  Hemostasis was obtained.  I placed a piece of snow in her gallbladder bed as this was just a very raw surface area.  I then removed the umbilical trocar.  I tied this stitch down.  This obliterated the defect completely.  I viewed this in the epigastrium and there was no evidence of any entry injury and the defect was obliterated.  I then desufflated the abdomen, removed the remainder of the trocars.  I closed this with 4-0 Monocryl and Dermabond.  She tolerated this well, was extubated in the operating room, and transferred to the recovery room in a stable condition.     Juanetta Gosling, MD     MCW/MEDQ  D:  08/16/2011  T:  08/16/2011  Job:  [id number]  Electronically Signed by Emelia Loron MD on 08/16/2011 04:44:27 PM

## 2011-08-17 NOTE — H&P (Signed)
Melinda Ruiz, Melinda Ruiz            ACCOUNT NO.:  [id number]  MEDICAL RECORD NO.:  [id number]  LOCATION:  [facility]                         FACILITY:  [facility]  PHYSICIAN:  Abigail Miyamoto, M.D. DATE OF BIRTH:  [birth year]  DATE OF ADMISSION:  08/16/2011 DATE OF DISCHARGE:                             HISTORY & PHYSICAL   CHIEF COMPLAINT:  Right upper quadrant abdominal pain.  HISTORY:  This is a 30 year old morbidly obese black female who presents with 2-day history of right upper quadrant abdominal pain, nausea and vomiting.  The pain refers to epigastric as well.  It does not refer to the back.  She has had similar attacks in the past.  She denies any jaundice.  The pain is moderate to severe.  PAST MEDICAL HISTORY:  Morbid obesity, hypertension, and hidradenitis.  PAST SURGICAL HISTORY:  C section x2, bilateral excision of axillary hidradenitis.  MEDICATIONS:  None.  She no longer takes her blood pressure.  ALLERGIES:  PENICILLIN.FAMILY HISTORY:  Positive for hypertension, diabetes.  SOCIAL HISTORY:  She used to smoke in the past, but denies doing it anymore.  She denies alcohol or drug use.  REVIEW OF SYSTEMS:  GENERAL:  Negative for fever or chills.  PULMONARY: Negative for cough, shortness breath, difficulty breathing.  CARDIAC: Negative for chest pain or irregular heartbeat.  ABDOMEN:  Listed as above.  There is no hematemesis.  Bowel movement has been normal.  There is no murmur.  GENITOURINARY:  Negative for dysuria or hematuria.  The rest of review of systems including skin, eyes, ears, nose, and throat, musculoskeletal,  neurologic, psychiatric, and endocrine are normal.  PHYSICAL EXAMINATION:  GENERAL:  This is a morbidly obese female in no acute stress. VITAL SIGNS:  Temperature 97.9, heart rate 79, blood pressures 130/86, respiratory rate 16. EYES:  Anicteric.  Pupils reactive bilaterally. ENT: External ears and nose are normal.  Hearing is normal.  Oropharynx is  clear. NECK:  Supple.  Trachea is midline.  There is no thyromegaly. LUNGS: Clear to auscultation bilaterally with normal respiratory effort. CARDIOVASCULAR:  Regular rate and rhythm.  There are no murmurs.  There is no peripheral edema. ABDOMEN:  Soft, morbidly obese.  There is tenderness with guarding in the epigastrium and right upper quadrant.  There are no masses.  There are no hernias.  There is no organomegaly. SKIN:  Shows no jaundice.  She has incisions in her axilla from her bilateral resections of hidradenitis. MUSCULOSKELETAL:  Normal with no long bone abnormalities.  Motor and sensory function grossly intact to all four extremities.  Strength is normal. NEUROLOGIC:  Shows her Glasgow coma scale to be 15.  She is awake, alert and oriented. PSYCHIATRIC:  Judgment and affect appear normal from a psychiatric standpoint.  LABORATORY DATA:  The patient has a bilirubin of 0.1, alkaline phosphatase is 88, lipase 24.  White blood count is elevated at 11.9, hemoglobin is 12.7, platelets are 346.  BUN and creatinine are 11 and0.77.  The patient has an ultrasound of the abdomen showing to have an impacted gallstone in the gallbladder neck which is 1.7 cm in size.  The common bile duct is slightly dilated at 8 mm.  IMPRESSION:  This  is a patient with cholecystitis and cholelithiasis. At this point, she will be admitted to the hospital for IV hydration.  I will leave her n.p.o. with bowel rest.  We will start IV antibiotics and proceed with a laparoscopic cholecystectomy with possible cholangiogram this admission.  I discussed the risk of the procedure with her in detail and she understands the reason for admission.     Abigail Miyamoto, M.D.     DB/MEDQ  D:  08/16/2011  T:  08/16/2011  Job:  [id number]  Electronically Signed by Abigail Miyamoto M.D. on 08/17/2011 05:41:03 PM

## 2011-09-02 ENCOUNTER — Encounter (INDEPENDENT_AMBULATORY_CARE_PROVIDER_SITE_OTHER): Payer: Self-pay | Admitting: General Surgery

## 2011-09-02 ENCOUNTER — Ambulatory Visit (INDEPENDENT_AMBULATORY_CARE_PROVIDER_SITE_OTHER): Payer: Medicare Other | Admitting: General Surgery

## 2011-09-02 VITALS — BP 132/86

## 2011-09-02 DIAGNOSIS — K8010 Calculus of gallbladder with chronic cholecystitis without obstruction: Secondary | ICD-10-CM

## 2011-09-02 NOTE — Progress Notes (Signed)
Melinda Ruiz is a 30 y.o. female who had a laparoscopic cholecystectomy with intraoperative cholangiogram.  The pathology report confirmed Chronic cholecystitis and cholelithiasis.  The patient reports that they are feeling well with normal bowel movements and good appetite.  The pre-operative symptoms of abdominal pain, nausea, and vomiting have resolved.    Physical examination - Incisions appear well-healed with no sign of infection or bleeding.   Abdomen - soft, non-tender  Impression:  s/p laparoscopic cholecystectomy  Plan:  She may resume a regular diet and full activity.  She may follow-up on a PRN basis.

## 2011-09-03 NOTE — Discharge Summary (Signed)
  [name], [name]            ACCOUNT NO.:  [id number]  MEDICAL RECORD NO.:  [id number]  LOCATION:  5006                         FACILITY:  [facility]  PHYSICIAN:  Thornton Park. Daphine Deutscher, MD  DATE OF BIRTH:  1981/05/03  DATE OF ADMISSION:  08/16/2011 DATE OF DISCHARGE:  08/17/2011                              DISCHARGE SUMMARY   ADMITTING DIAGNOSIS:  Acute cholecystitis.  PROCEDURE:  Laparoscopic cholecystectomy.  COURSE IN THE HOSPITAL:  Melinda Ruiz was admitted by Dr. Magnus Ivan on August 16, 2011, and underwent a laparoscopic cholecystectomy by Dr. Dwain Sarna.  She had acute cholecystitis.  She was seen by me on postop day #1 doing well.  She was feeling better and wanted to go home.  She was given a script for Vicodin 5/500 #30. She was advised to return to the [facility] clinic in 2 weeks.  Condition improved.     Thornton Park Daphine Deutscher, MD     MBM/MEDQ  D:  08/17/2011  T:  08/17/2011  Job:  [id number]  Electronically Signed by Luretha Murphy MD on 09/03/2011 01:34:27 PM

## 2011-10-03 LAB — CBC
HCT: 41.9
Hemoglobin: 14.5
MCHC: 34.6
MCV: 91.4
RBC: 4.59
RDW: 13.4

## 2011-10-03 LAB — COMPREHENSIVE METABOLIC PANEL
BUN: 7
CO2: 25
Calcium: 9.1
Creatinine, Ser: 1.21 — ABNORMAL HIGH
GFR calc non Af Amer: 54 — ABNORMAL LOW
Glucose, Bld: 106 — ABNORMAL HIGH
Total Protein: 7.1

## 2011-10-03 LAB — URINALYSIS, ROUTINE W REFLEX MICROSCOPIC
Glucose, UA: NEGATIVE
Ketones, ur: 15 — AB
Nitrite: NEGATIVE
Protein, ur: 30 — AB
pH: 6.5

## 2011-10-03 LAB — DIFFERENTIAL
Eosinophils Absolute: 0.2
Lymphs Abs: 3.5
Monocytes Relative: 8
Neutro Abs: 7.9 — ABNORMAL HIGH
Neutrophils Relative %: 63

## 2011-10-03 LAB — LIPASE, BLOOD: Lipase: 18

## 2011-10-03 LAB — POCT PREGNANCY, URINE: Operator id: 282201

## 2011-10-03 LAB — URINE MICROSCOPIC-ADD ON

## 2011-11-07 ENCOUNTER — Emergency Department (HOSPITAL_COMMUNITY)
Admission: EM | Admit: 2011-11-07 | Discharge: 2011-11-07 | Disposition: A | Discharge status: Home / Self Care | Payer: Medicare Other | Attending: Emergency Medicine | Admitting: Emergency Medicine

## 2011-11-07 ENCOUNTER — Encounter (HOSPITAL_COMMUNITY): Payer: Self-pay | Admitting: *Deleted

## 2011-11-07 DIAGNOSIS — M67439 Ganglion, unspecified wrist: Secondary | ICD-10-CM

## 2011-11-07 DIAGNOSIS — M25439 Effusion, unspecified wrist: Secondary | ICD-10-CM | POA: Insufficient documentation

## 2011-11-07 DIAGNOSIS — M6740 Ganglion, unspecified site: Secondary | ICD-10-CM | POA: Insufficient documentation

## 2011-11-07 DIAGNOSIS — R209 Unspecified disturbances of skin sensation: Secondary | ICD-10-CM | POA: Insufficient documentation

## 2011-11-07 DIAGNOSIS — M25539 Pain in unspecified wrist: Secondary | ICD-10-CM | POA: Insufficient documentation

## 2011-11-07 DIAGNOSIS — I10 Essential (primary) hypertension: Secondary | ICD-10-CM | POA: Insufficient documentation

## 2011-11-07 HISTORY — DX: Essential (primary) hypertension: I10

## 2011-11-07 MED ORDER — IBUPROFEN 800 MG PO TABS: 800.0000 mg | ORAL_TABLET | Freq: Three times a day (TID) | ORAL | Status: AC

## 2011-11-07 NOTE — ED Notes (Signed)
Pt reports left wrist pain x unknown amount of time, reports she feels a knot in the wrist. States pain radiates into left arm. Reports numbness and tingling to left fingers. Pt able to move fingers without difficulty. Pt able to bend wrist.

## 2011-11-07 NOTE — ED Provider Notes (Signed)
Evaluation and management procedures were performed by the PA/NP under my supervision/collaboration.   Dione Booze, MD 11/07/11 [phone]

## 2011-11-07 NOTE — ED Provider Notes (Signed)
History     CSN: [id number] Arrival date & time: 11/07/2011 11:45 AM   First MD Initiated Contact with Patient 11/07/11 1330      Chief Complaint  Patient presents with  . Wrist Pain    (Consider location/radiation/quality/duration/timing/severity/associated sxs/prior treatment) HPI Pt has had a knot on left posterior wrist for 10 months.  Size waxes and wanes and was biggest during third trimester of pregnancy.  Currently painful on both dorsal and palmar surface of wrist w/ radiation into forearm and associated tingling in fingers.  Her OB told her she may have a cyst.  Denies trauma.  Denies fever.    Past Medical History  Diagnosis Date  . Hypertension     Past Surgical History  Procedure Date  . Cholecystectomy   . Cesarean section   . Cystectomy     Family History  Problem Relation Age of Onset  . Kidney cancer Father     History  Substance Use Topics  . Smoking status: Former Smoker    Quit date: 08/02/2011  . Smokeless tobacco: Not on file  . Alcohol Use: No    OB History    Grav Para Term Preterm Abortions TAB SAB Ect Mult Living                  Review of Systems  All other systems reviewed and are negative.    Allergies  Penicillins  Home Medications   Current Outpatient Rx  Name Route Sig Dispense Refill  . IBUPROFEN 800 MG PO TABS Oral Take 1 tablet (800 mg total) by mouth 3 (three) times daily. 12 tablet 0    BP 131/80  Pulse 95  Temp(Src) 98.2 F (36.8 C) (Oral)  Resp 18  SpO2 99%  Physical Exam  Nursing note and vitals reviewed. Constitutional: She is oriented to person, place, and time. She appears well-developed and well-nourished. No distress.  HENT:  Head: Normocephalic and atraumatic.  Eyes:       Normal appearance  Neck: Normal range of motion.  Musculoskeletal:       L wrist w/ 2cm knot on dorsal surface.  No overlying erythema.  Tenderness over knot as well as palmar surface of wrist.  Pain w/ wrist dorsi-flexion  only.  Full active ROM of fingers but pain w/ ROM thumb.  Sensation of fingers intact. 2+ radial pulse.    Neurological: She is alert and oriented to person, place, and time.  Psychiatric: She has a normal mood and affect. Her behavior is normal.    ED Course  Procedures (including critical care time)  Labs Reviewed - No data to display No results found.   1. Ganglion cyst of wrist       MDM  Pt presents w/ s/sx most consistent w/ ganglion cyst of dorsal surface of left wrist.  Ortho tech placed in velcrow thumb-spica splint and pt discharged home w/ iburofen and referral to ortho.  Return precautions discussed.         Otilio Miu, [place] 11/07/11 1845

## 2011-11-07 NOTE — ED Notes (Signed)
Ortho paged to place thumb spica splint on pt

## 2011-11-07 NOTE — ED Notes (Signed)
Pt on call bell asking how long she will be here and for soda for her child that is playing with the hand sanitizer in the room

## 2012-01-21 ENCOUNTER — Other Ambulatory Visit (HOSPITAL_COMMUNITY): Payer: Self-pay | Admitting: Orthopedic Surgery

## 2012-01-22 ENCOUNTER — Other Ambulatory Visit (HOSPITAL_COMMUNITY): Payer: Self-pay

## 2012-01-23 ENCOUNTER — Encounter (HOSPITAL_COMMUNITY): Payer: Self-pay | Admitting: Pharmacy Technician

## 2012-01-29 ENCOUNTER — Encounter (HOSPITAL_COMMUNITY): Payer: Self-pay

## 2012-01-29 ENCOUNTER — Encounter (HOSPITAL_COMMUNITY)
Admission: RE | Admit: 2012-01-29 | Discharge: 2012-01-29 | Disposition: A | Discharge status: Home / Self Care | Payer: Medicare Other | Source: Ambulatory Visit | Attending: Orthopedic Surgery | Admitting: Orthopedic Surgery

## 2012-01-29 HISTORY — DX: Anemia, unspecified: D64.9

## 2012-01-29 HISTORY — DX: Mental disorder, not otherwise specified: F99

## 2012-01-29 HISTORY — DX: Calculus of kidney: N20.0

## 2012-01-29 HISTORY — DX: Major depressive disorder, single episode, unspecified: F32.9

## 2012-01-29 HISTORY — DX: Adverse effect of unspecified anesthetic, initial encounter: T41.45XA

## 2012-01-29 HISTORY — DX: Other complications of anesthesia, initial encounter: T88.59XA

## 2012-01-29 HISTORY — DX: Depression, unspecified: F32.A

## 2012-01-29 LAB — CBC
MCH: 28.3 pg (ref 26.0–34.0)
Platelets: 387 10*3/uL (ref 150–400)
RBC: 4.34 MIL/uL (ref 3.87–5.11)
RDW: 14.4 % (ref 11.5–15.5)
WBC: 11.9 10*3/uL — ABNORMAL HIGH (ref 4.0–10.5)

## 2012-01-29 LAB — BASIC METABOLIC PANEL
Calcium: 9.4 mg/dL (ref 8.4–10.5)
Creatinine, Ser: 0.74 mg/dL (ref 0.50–1.10)
GFR calc non Af Amer: >90 mL/min (ref >90)
Glucose, Bld: 74 mg/dL (ref 70–99)
Sodium: 134 mEq/L — ABNORMAL LOW (ref 135–145)

## 2012-01-29 LAB — HCG, SERUM, QUALITATIVE: Preg, Serum: NEGATIVE

## 2012-01-29 NOTE — Progress Notes (Signed)
Pt asked if we could give her a  prescription for metoprolol- she is ou. I instructed to call her pcp to call in some to last until her appt in mid Feb.  Pt said she would.

## 2012-01-29 NOTE — Pre-Procedure Instructions (Signed)
20 Melinda Ruiz  01/29/2012   Your procedure is scheduled on:  Tues, Feb 5 @ 0730  Report to [facility] at 0530 AM.  Call this number if you have problems the morning of surgery: [phone]   Remember:   Do not eat food:After Midnight.  May have clear liquids: up to 4 Hours before arrival.  Clear liquids include soda, tea, black coffee, apple or grape juice, broth.  Take these medicines the morning of surgery with A SIP OF WATER: Labetalol   Do not wear jewelry, make-up or nail polish.  Do not wear lotions, powders, or perfumes. You may wear deodorant.  Do not shave 48 hours prior to surgery.  Do not bring valuables to the hospital.  Contacts, dentures or bridgework may not be worn into surgery.  Leave suitcase in the car. After surgery it may be brought to your room.  For patients admitted to the hospital, checkout time is 11:00 AM the day of discharge.   Patients discharged the day of surgery will not be allowed to drive home.  Name and phone number of your driver:   Special Instructions: CHG Shower Use Special Wash: 1/2 bottle night before surgery and 1/2 bottle morning of surgery.   Please read over the following fact sheets that you were given: Pain Booklet, Coughing and Deep Breathing, MRSA Information and Surgical Site Infection Prevention

## 2012-02-02 MED ORDER — CLINDAMYCIN PHOSPHATE 600 MG/50ML IV SOLN
600.0000 mg | INTRAVENOUS | Status: AC
  Administered 2012-02-03: 600 mg via INTRAVENOUS
  Filled 2012-02-02 (×2): qty 50

## 2012-02-03 ENCOUNTER — Ambulatory Visit (HOSPITAL_COMMUNITY)
Admission: RE | Admit: 2012-02-03 | Discharge: 2012-02-03 | Disposition: A | Discharge status: Home / Self Care | Payer: Medicare Other | Source: Ambulatory Visit | Attending: Orthopedic Surgery | Admitting: Orthopedic Surgery

## 2012-02-03 ENCOUNTER — Other Ambulatory Visit: Payer: Self-pay | Admitting: Orthopedic Surgery

## 2012-02-03 ENCOUNTER — Encounter (HOSPITAL_COMMUNITY)
Admission: RE | Disposition: A | Discharge status: Home / Self Care | Payer: Self-pay | Source: Ambulatory Visit | Attending: Orthopedic Surgery

## 2012-02-03 ENCOUNTER — Encounter (HOSPITAL_COMMUNITY): Payer: Self-pay | Admitting: Anesthesiology

## 2012-02-03 ENCOUNTER — Encounter (HOSPITAL_COMMUNITY): Payer: Self-pay | Admitting: *Deleted

## 2012-02-03 ENCOUNTER — Ambulatory Visit (HOSPITAL_COMMUNITY): Payer: Medicare Other | Admitting: Anesthesiology

## 2012-02-03 DIAGNOSIS — G5600 Carpal tunnel syndrome, unspecified upper limb: Secondary | ICD-10-CM | POA: Insufficient documentation

## 2012-02-03 DIAGNOSIS — G5602 Carpal tunnel syndrome, left upper limb: Secondary | ICD-10-CM

## 2012-02-03 DIAGNOSIS — M6740 Ganglion, unspecified site: Secondary | ICD-10-CM | POA: Insufficient documentation

## 2012-02-03 DIAGNOSIS — Z01812 Encounter for preprocedural laboratory examination: Secondary | ICD-10-CM | POA: Insufficient documentation

## 2012-02-03 DIAGNOSIS — I10 Essential (primary) hypertension: Secondary | ICD-10-CM | POA: Insufficient documentation

## 2012-02-03 HISTORY — PX: CARPAL TUNNEL RELEASE: SHX101

## 2012-02-03 HISTORY — PX: GANGLION CYST EXCISION: SHX1691

## 2012-02-03 SURGERY — CARPAL TUNNEL RELEASE
Anesthesia: General | Site: Wrist | Laterality: Left | Wound class: Clean

## 2012-02-03 MED ORDER — FENTANYL CITRATE 0.05 MG/ML IJ SOLN
INTRAMUSCULAR | Status: DC | PRN
  Administered 2012-02-03: 50 ug via INTRAVENOUS
  Administered 2012-02-03 (×4): 100 ug via INTRAVENOUS

## 2012-02-03 MED ORDER — OXYCODONE-ACETAMINOPHEN 10-325 MG PO TABS: 1.0000 | ORAL_TABLET | ORAL | Status: AC | PRN

## 2012-02-03 MED ORDER — BUPIVACAINE HCL (PF) 0.25 % IJ SOLN
INTRAMUSCULAR | Status: DC | PRN
  Administered 2012-02-03: 28 mL

## 2012-02-03 MED ORDER — MEPERIDINE HCL 25 MG/ML IJ SOLN: 6.2500 mg | INTRAMUSCULAR | Status: DC | PRN

## 2012-02-03 MED ORDER — LACTATED RINGERS IV SOLN
INTRAVENOUS | Status: DC | PRN
  Administered 2012-02-03 (×2): via INTRAVENOUS

## 2012-02-03 MED ORDER — LABETALOL HCL 100 MG PO TABS
100.0000 mg | ORAL_TABLET | ORAL | Status: AC
  Administered 2012-02-03: 100 mg via ORAL
  Filled 2012-02-03: qty 1

## 2012-02-03 MED ORDER — DEXAMETHASONE SODIUM PHOSPHATE 4 MG/ML IJ SOLN
INTRAMUSCULAR | Status: DC | PRN
  Administered 2012-02-03: 4 mg via INTRAVENOUS

## 2012-02-03 MED ORDER — MUPIROCIN 2 % EX OINT
TOPICAL_OINTMENT | CUTANEOUS | Status: AC
  Administered 2012-02-03: 1 via NASAL
  Filled 2012-02-03: qty 22

## 2012-02-03 MED ORDER — 0.9 % SODIUM CHLORIDE (POUR BTL) OPTIME
TOPICAL | Status: DC | PRN
  Administered 2012-02-03: 1000 mL

## 2012-02-03 MED ORDER — ONDANSETRON HCL 4 MG/2ML IJ SOLN
INTRAMUSCULAR | Status: DC | PRN
  Administered 2012-02-03: 4 mg via INTRAVENOUS

## 2012-02-03 MED ORDER — METOCLOPRAMIDE HCL 5 MG/ML IJ SOLN
INTRAMUSCULAR | Status: DC | PRN
  Administered 2012-02-03: 10 mg via INTRAVENOUS

## 2012-02-03 MED ORDER — MIDAZOLAM HCL 5 MG/5ML IJ SOLN
INTRAMUSCULAR | Status: DC | PRN
  Administered 2012-02-03: 2 mg via INTRAVENOUS

## 2012-02-03 MED ORDER — SUCCINYLCHOLINE CHLORIDE 20 MG/ML IJ SOLN
INTRAMUSCULAR | Status: DC | PRN
  Administered 2012-02-03: 100 mg via INTRAVENOUS

## 2012-02-03 MED ORDER — HYDROMORPHONE HCL PF 1 MG/ML IJ SOLN
0.2500 mg | INTRAMUSCULAR | Status: DC | PRN
  Administered 2012-02-03 (×2): 0.5 mg via INTRAVENOUS

## 2012-02-03 MED ORDER — GLYCOPYRROLATE 0.2 MG/ML IJ SOLN
INTRAMUSCULAR | Status: DC | PRN
  Administered 2012-02-03: .6 mg via INTRAVENOUS

## 2012-02-03 MED ORDER — NEOSTIGMINE METHYLSULFATE 1 MG/ML IJ SOLN
INTRAMUSCULAR | Status: DC | PRN
  Administered 2012-02-03: 5 mg via INTRAVENOUS

## 2012-02-03 MED ORDER — PROPOFOL 10 MG/ML IV EMUL
INTRAVENOUS | Status: DC | PRN
  Administered 2012-02-03: 200 mg via INTRAVENOUS

## 2012-02-03 MED ORDER — PROMETHAZINE HCL 25 MG/ML IJ SOLN: 6.2500 mg | INTRAMUSCULAR | Status: DC | PRN

## 2012-02-03 MED ORDER — VECURONIUM BROMIDE 10 MG IV SOLR
INTRAVENOUS | Status: DC | PRN
  Administered 2012-02-03: 4 mg via INTRAVENOUS

## 2012-02-03 MED ORDER — DEXTROSE 5 % IV SOLN
INTRAVENOUS | Status: DC | PRN
  Administered 2012-02-03: 07:00:00 via INTRAVENOUS

## 2012-02-03 SURGICAL SUPPLY — 72 items
BANDAGE CONFORM 3  STR LF (GAUZE/BANDAGES/DRESSINGS) IMPLANT
BANDAGE ELASTIC 3 VELCRO ST LF (GAUZE/BANDAGES/DRESSINGS) ×2 IMPLANT
BANDAGE ELASTIC 4 VELCRO ST LF (GAUZE/BANDAGES/DRESSINGS) ×2 IMPLANT
BANDAGE GAUZE ELAST BULKY 4 IN (GAUZE/BANDAGES/DRESSINGS) ×2 IMPLANT
BENZOIN TINCTURE PRP APPL 2/3 (GAUZE/BANDAGES/DRESSINGS) ×2 IMPLANT
BLADE SURG ROTATE 9660 (MISCELLANEOUS) IMPLANT
BNDG COHESIVE 1X5 TAN STRL LF (GAUZE/BANDAGES/DRESSINGS) IMPLANT
BNDG ELASTIC 2 VLCR STRL LF (GAUZE/BANDAGES/DRESSINGS) IMPLANT
BNDG ESMARK 4X9 LF (GAUZE/BANDAGES/DRESSINGS) ×2 IMPLANT
CLOTH BEACON ORANGE TIMEOUT ST (SAFETY) ×2 IMPLANT
CORDS BIPOLAR (ELECTRODE) ×2 IMPLANT
COVER SURGICAL LIGHT HANDLE (MISCELLANEOUS) ×4 IMPLANT
CUFF TOURNIQUET SINGLE 18IN (TOURNIQUET CUFF) IMPLANT
CUFF TOURNIQUET SINGLE 24IN (TOURNIQUET CUFF) ×2 IMPLANT
DRAPE INCISE IOBAN 66X45 STRL (DRAPES) IMPLANT
DRAPE OEC MINIVIEW 54X84 (DRAPES) IMPLANT
DRAPE SURG 17X23 STRL (DRAPES) IMPLANT
DRAPE U-SHAPE 47X51 STRL (DRAPES) ×2 IMPLANT
DRSG EMULSION OIL 3X3 NADH (GAUZE/BANDAGES/DRESSINGS) IMPLANT
DRSG PAD ABDOMINAL 8X10 ST (GAUZE/BANDAGES/DRESSINGS) ×4 IMPLANT
DURAPREP 26ML APPLICATOR (WOUND CARE) ×2 IMPLANT
DURAPREP 6ML APPLICATOR 50/CS (WOUND CARE) ×2 IMPLANT
ELECT REM PT RETURN 9FT ADLT (ELECTROSURGICAL)
ELECTRODE REM PT RTRN 9FT ADLT (ELECTROSURGICAL) IMPLANT
GAUZE SPONGE 2X2 8PLY STRL LF (GAUZE/BANDAGES/DRESSINGS) IMPLANT
GAUZE XEROFORM 1X8 LF (GAUZE/BANDAGES/DRESSINGS) ×2 IMPLANT
GLOVE BIOGEL PI IND STRL 7.0 (GLOVE) ×2 IMPLANT
GLOVE BIOGEL PI IND STRL 8 (GLOVE) ×1 IMPLANT
GLOVE BIOGEL PI INDICATOR 7.0 (GLOVE) ×2
GLOVE BIOGEL PI INDICATOR 8 (GLOVE) ×1
GLOVE EXAM NITRILE LRG STRL (GLOVE) ×2 IMPLANT
GLOVE SS BIOGEL STRL SZ 6.5 (GLOVE) ×1 IMPLANT
GLOVE SUPERSENSE BIOGEL SZ 6.5 (GLOVE) ×1
GLOVE SURG ORTHO 8.0 STRL STRW (GLOVE) ×2 IMPLANT
GOWN PREVENTION PLUS LG XLONG (DISPOSABLE) ×2 IMPLANT
GOWN PREVENTION PLUS XLARGE (GOWN DISPOSABLE) ×2 IMPLANT
GOWN STRL NON-REIN LRG LVL3 (GOWN DISPOSABLE) ×4 IMPLANT
KIT BASIN OR (CUSTOM PROCEDURE TRAY) ×2 IMPLANT
KIT ROOM TURNOVER OR (KITS) ×2 IMPLANT
LOOP VESSEL MAXI BLUE (MISCELLANEOUS) IMPLANT
MANIFOLD NEPTUNE II (INSTRUMENTS) ×2 IMPLANT
NEEDLE HYPO 25GX1X1/2 BEV (NEEDLE) ×2 IMPLANT
NS IRRIG 1000ML POUR BTL (IV SOLUTION) ×2 IMPLANT
PACK ORTHO EXTREMITY (CUSTOM PROCEDURE TRAY) ×2 IMPLANT
PAD ARMBOARD 7.5X6 YLW CONV (MISCELLANEOUS) ×2 IMPLANT
PAD CAST 4YDX4 CTTN HI CHSV (CAST SUPPLIES) ×1 IMPLANT
PADDING CAST COTTON 4X4 STRL (CAST SUPPLIES) ×1
PENCIL BUTTON HOLSTER BLD 10FT (ELECTRODE) IMPLANT
SPECIMEN JAR SMALL (MISCELLANEOUS) IMPLANT
SPONGE GAUZE 2X2 STER 10/PKG (GAUZE/BANDAGES/DRESSINGS)
SPONGE GAUZE 4X4 12PLY (GAUZE/BANDAGES/DRESSINGS) ×2 IMPLANT
STRIP CLOSURE SKIN 1/2X4 (GAUZE/BANDAGES/DRESSINGS) ×2 IMPLANT
SUCTION FRAZIER TIP 10 FR DISP (SUCTIONS) ×2 IMPLANT
SUT ETHIBOND 4 0 TF (SUTURE) IMPLANT
SUT ETHIBOND 5 0 P 3 (SUTURE)
SUT ETHILON 3 0 PS 1 (SUTURE) ×4 IMPLANT
SUT ETHILON 4 0 P 3 18 (SUTURE) IMPLANT
SUT ETHILON 5 0 P 3 18 (SUTURE)
SUT NYLON ETHILON 5-0 P-3 1X18 (SUTURE) IMPLANT
SUT POLY ETHIBOND 5-0 P-3 1X18 (SUTURE) IMPLANT
SUT PROLENE 4 0 P 3 18 (SUTURE) IMPLANT
SUT SILK 4 0 PS 2 (SUTURE) IMPLANT
SUT VIC AB 2-0 CT1 27 (SUTURE)
SUT VIC AB 2-0 CT1 TAPERPNT 27 (SUTURE) IMPLANT
SUT VIC AB 3-0 FS2 27 (SUTURE) ×2 IMPLANT
SYR CONTROL 10ML LL (SYRINGE) ×2 IMPLANT
SYSTEM CHEST DRAIN TLS 7FR (DRAIN) IMPLANT
TOWEL OR 17X24 6PK STRL BLUE (TOWEL DISPOSABLE) ×2 IMPLANT
TOWEL OR 17X26 10 PK STRL BLUE (TOWEL DISPOSABLE) ×2 IMPLANT
TUBE CONNECTING 12X1/4 (SUCTIONS) IMPLANT
UNDERPAD 30X30 INCONTINENT (UNDERPADS AND DIAPERS) ×2 IMPLANT
WATER STERILE IRR 1000ML POUR (IV SOLUTION) IMPLANT

## 2012-02-03 NOTE — Anesthesia Postprocedure Evaluation (Signed)
  Anesthesia Post-op Note  Patient: Melinda Ruiz  Procedure(s) Performed:  CARPAL TUNNEL RELEASE - left carpal tunnel release; REMOVAL GANGLION OF WRIST - left dorsal ganglion cyst excision  Patient Location: PACU  Anesthesia Type: General  Level of Consciousness: awake and sedated  Airway and Oxygen Therapy: Patient Spontanous Breathing  Post-op Pain: mild  Post-op Assessment: Post-op Vital signs reviewed  Post-op Vital Signs: stable  Complications: No apparent anesthesia complications

## 2012-02-03 NOTE — Brief Op Note (Signed)
02/03/2012  9:07 AM  PATIENT:  Melinda Ruiz  31 y.o. female  PRE-OPERATIVE DIAGNOSIS:  left carpal tunnel syndrome, left dorsal ganglion cyst  POST-OPERATIVE DIAGNOSIS:  left carpal tunnel syndrome, left dorsal ganglion cyst  PROCEDURE:  Procedure(s): CARPAL TUNNEL RELEASE REMOVAL GANGLION OF WRIST  SURGEON:  Surgeon(s): Cammy Copa, MD  ASSISTANT:none  ANESTHESIA:   general  EBL: 15 ml    Total I/O In: 1050 [I.V.:1050] Out: -   BLOOD ADMINISTERED: none  DRAINS: none   LOCAL MEDICATIONS USED:  none  SPECIMEN:  No Specimen  COUNTS:  YES  TOURNIQUET:   Total Tourniquet Time Documented: Upper Arm (Left) - 37 minutes  DICTATION: .Other Dictation: Dictation Number [id number]   PLAN OF CARE: Discharge to home after PACU  PATIENT DISPOSITION:  PACU - hemodynamically stable

## 2012-02-03 NOTE — Preoperative (Signed)
Beta Blockers   Reason not to administer Beta Blockers:Pt took labetalol @0630  on 02/03/2012

## 2012-02-03 NOTE — Transfer of Care (Signed)
Immediate Anesthesia Transfer of Care Note  Patient: Melinda Ruiz  Procedure(s) Performed:  CARPAL TUNNEL RELEASE - left carpal tunnel release; REMOVAL GANGLION OF WRIST - left dorsal ganglion cyst excision  Patient Location: PACU  Anesthesia Type: General  Level of Consciousness: awake, oriented, sedated, patient cooperative and responds to stimulation  Airway & Oxygen Therapy: Patient Spontanous Breathing and Patient connected to face mask oxygen  Post-op Assessment: Report given to PACU RN, Post -op Vital signs reviewed and stable, Patient moving all extremities and Patient moving all extremities X 4  Post vital signs: Reviewed and stable  Complications: No apparent anesthesia complications

## 2012-02-03 NOTE — Anesthesia Preprocedure Evaluation (Addendum)
Anesthesia Evaluation  Patient identified by MRN, date of birth, ID band Patient awake    Reviewed: Allergy & Precautions, H&P , NPO status , Patient's Chart, lab work & pertinent test results, reviewed documented beta blocker date and time   History of Anesthesia Complications (+) PROLONGED EMERGENCE and DIFFICULT IV STICK / SPECIAL LINE  Airway Mallampati: I  Neck ROM: Full    Dental  (+) Teeth Intact   Pulmonary Recent URI , Residual Cough, former smoker clear to auscultation        Cardiovascular hypertension, Pt. on medications + PND Regular Normal    Neuro/Psych PSYCHIATRIC DISORDERS Anxiety Depression Negative Neurological ROS     GI/Hepatic negative GI ROS, Neg liver ROS,   Endo/Other  Negative Endocrine ROS  Renal/GU negative Renal ROS  Genitourinary negative   Musculoskeletal  (+) Arthritis -, Osteoarthritis,    Abdominal (+) obese,   Peds negative pediatric ROS (+)  Hematology negative hematology ROS (+)   Anesthesia Other Findings   Reproductive/Obstetrics negative OB ROS                         Anesthesia Physical Anesthesia Plan  ASA: II  Anesthesia Plan: General   Post-op Pain Management:    Induction: Intravenous  Airway Management Planned: Oral ETT  Additional Equipment:   Intra-op Plan:   Post-operative Plan: Extubation in OR  Informed Consent: I have reviewed the patients History and Physical, chart, labs and discussed the procedure including the risks, benefits and alternatives for the proposed anesthesia with the patient or authorized representative who has indicated his/her understanding and acceptance.   Dental advisory given  Plan Discussed with: CRNA and Surgeon  Anesthesia Plan Comments:         Anesthesia Quick Evaluation

## 2012-02-03 NOTE — Anesthesia Procedure Notes (Signed)
Procedure Name: Intubation Date/Time: 02/03/2012 7:46 AM Performed by: Wray Kearns A Pre-anesthesia Checklist: Patient identified, Timeout performed, Emergency Drugs available, Suction available and Patient being monitored Patient Re-evaluated:Patient Re-evaluated prior to inductionOxygen Delivery Method: Circle System Utilized Preoxygenation: Pre-oxygenation with 100% oxygen Intubation Type: IV induction, Rapid sequence and Cricoid Pressure applied Ventilation: Mask ventilation without difficulty Laryngoscope Size: Mac and 4 Grade View: Grade I Tube type: Oral Tube size: 8.5 mm Number of attempts: 1 Airway Equipment and Method: stylet Placement Confirmation: ETT inserted through vocal cords under direct vision,  positive ETCO2,  CO2 detector and breath sounds checked- equal and bilateral Secured at: 22 cm Tube secured with: Tape Dental Injury: Teeth and Oropharynx as per pre-operative assessment

## 2012-02-03 NOTE — Op Note (Signed)
Melinda Ruiz, Melinda Ruiz            ACCOUNT NO.:  [id number]  MEDICAL RECORD NO.:  [id number]  LOCATION:  [facility]                         FACILITY:  [facility]  PHYSICIAN:  Burnard Bunting, M.D.    DATE OF BIRTH:  01-May-1981  DATE OF PROCEDURE:  02/03/2012 DATE OF DISCHARGE:                              OPERATIVE REPORT   PREOPERATIVE DIAGNOSES: 1. Left carpal tunnel syndrome. 2. Left dorsal wrist ganglion.  PROCEDURES: 1. Left carpal tunnel release. 2. Excision of left dorsal wrist ganglion.  SURGEONS:  Burnard Bunting, MD  ASSISTANT:  None.  ANESTHESIA:  General endotracheal.  ESTIMATED BLOOD LOSS:  Minimal.  INDICATIONS:  [name] is a patient with left wrist pain refractory to nonoperative management with carpal tunnel syndrome by EMG nerve study and dorsal ganglion cyst by MRI scanning.  She presents now for operative treatment of both after failure of conservative management and explanation of risks and benefits.  PROCEDURE OF DETAIL:  The patient was brought to the operating room where general endotracheal anesthesia was induced, preop antibiotics administered.  Left wrist was prescrubbed with alcohol, Betadine, which allowed to air dry, prepped with DuraPrep solution and draped in a sterile manner.  A time-out was called.  Left arm was elevated, exsanguinated with Esmarch wrap.  Tourniquet was inflated.  Incision was made at the intersection of the radial border of the fourth finger and Kaplan's cardinal line extending proximally to the wrist flexion crease. Skin and subcutaneous tissue were sharply divided.  The palmar fascia was identified and divided.  Transverse carpal ligament was visualized. It was then divided longitudinally over 2-3 mm.  Right angle retractor was then placed between the median nerve and the transverse carpal ligament which was then divided distally and proximally under direct visualization.  Divided proximally to the forearm fascia, distally  out to the end of the transverse carpal ligament.  Full decompression of the median nerve was observed.  No other masses were present in the carpal canal.  This incision was irrigated.  Skin edges were anesthetized.  The hand was then turned over and a transverse incision was made over the radial carpal joint between the third and fourth compartments.  Skin and subcutaneous tissues were sharply divided.  Care was taken to avoid sensory branches of the nerves as well as traversing veins.  The retinaculum was partially incised.  Fourth compartment was mobilized ulnarly.  The ganglion was then visualized in accordance with its location on the MRI scan preoperatively.  Ganglion was then excised along with a small flap of the capsule.  This area was then cauterized. Ganglion was sent to Pathology for specimen.  Tourniquet was released at this time.  Skin edges were also anesthetized on the dorsal incision. Bleeding points were encountered and controlled with electrocautery.  Both incisions were then irrigated and closed using 3-0 Vicryl and 3-0 nylon suture.  Bulky wrist splint was applied.  The patient tolerated the procedure well without immediate complication and transferred to the recovery room in stable condition.     Burnard Bunting, M.D.     GSD/MEDQ  D:  02/03/2012  T:  02/03/2012  Job:  [id number]

## 2012-02-03 NOTE — Progress Notes (Signed)
mupiricin given by error,pt, swab results were neg for staph and MRSA

## 2012-02-03 NOTE — H&P (Signed)
Melinda Ruiz is an 31 y.o. female.   Chief Complaint: Left wrist pain HPI: Melinda Ruiz is a 31 year old female with a long history of left wrist pain. She has known carpal tunnel syndrome by EMG nerve study testing and a dorsal ganglion cyst by MRI scanning. Both of these entities have been refractory to nonoperative management. The patient describes numbness and tingling and wrist pain with activity. She has tried splinting anti-inflammatories and activity modification without relief. She presents now for operative management.  Past Medical History  Diagnosis Date  . Hypertension   . Complication of anesthesia     "Oxygen level drops"  . Kidney stones 2010  . Constipation   . Anemia     during pregancy  . 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  Has been seen at [facility]  . Depression     Past Surgical History  Procedure Date  . Cesarean section 2006, 2011  . Cystectomy   . Lympectomy   . Lymp glands removed--under arm bil   . Cholecystectomy 06/2011  . Tubal ligation 2011  . Multiple tooth extractions 2003    and wisdom teeth  . Pilonidial cyst removed 1990's    Family History  Problem Relation Age of Onset  . Kidney cancer Father   . Anesthesia problems Neg Hx    Social History:  reports that she quit smoking about 6 months ago. She does not have any smokeless tobacco history on file. She reports that she does not drink alcohol or use illicit drugs.  Allergies:  Allergies  Allergen Reactions  . Penicillins Hives    Medications Prior to Admission  Medication Dose Route Frequency Provider Last Rate Last Dose  . clindamycin (CLEOCIN) IVPB 600 mg  600 mg Intravenous 60 min Pre-Op Cammy Copa, MD      . labetalol (NORMODYNE) tablet 100 mg  100 mg Oral To 283 Cammy Copa, MD   100 mg at 02/03/12 4098  . mupirocin ointment (BACTROBAN) 2 %        1 application at 02/03/12 1191   No current outpatient prescriptions on  file as of 02/03/2012.    No results found for this or any previous visit (from the past 48 hour(s)). No results found.  Review of Systems  Constitutional: Negative.   HENT: Negative.   Eyes: Negative.   Respiratory: Negative.   Cardiovascular: Negative.   Gastrointestinal: Negative.   Genitourinary: Negative.   Musculoskeletal: Positive for joint pain.  Skin: Negative.   Neurological: Negative.   Endo/Heme/Allergies: Negative.   Psychiatric/Behavioral: Negative.     Blood pressure 123/83, pulse 104, temperature 98.2 F (36.8 C), temperature source Oral, resp. rate 20, SpO2 100.00%. Physical Exam  Constitutional: She appears well-developed and well-nourished.  HENT:  Head: Normocephalic and atraumatic.  Eyes: EOM are normal. Pupils are equal, round, and reactive to light.  Neck: Normal range of motion. Neck supple.  Cardiovascular: Normal rate and regular rhythm.   Respiratory: Effort normal.  GI: Soft.  Skin: Skin is warm and dry.  Psychiatric: She has a normal mood and affect.   left wrist exam demonstrates positive carpal tunnel compression testing good perfusion and sensation to the hand. Dorsal wrist tenderness. Pain with dorsiflexion of the wrist. Palpable radial pulse. Negative Tinel's in the cubital tunnel at the elbow.   Assessment/Plan Impression is left wrist carpal tunnel syndrome moderate to severe by EMG nerve study testing. Clinically she has been  refractory to nonoperative management. MRI scanning of the wrist also shows dorsal wrist ganglion which has also been symptomatic. Plan is for carpal tunnel release and dorsal wrist ganglion excision. Risk and benefits are discussed with the patient. Include but are not limited to infection nerve vessel damage recurrence of the cyst wrist stiffness and incomplete pain relief among others. All questions answered.  Arlyce Circle SCOTT 02/03/2012, 7:25 AM

## 2012-02-04 ENCOUNTER — Encounter (HOSPITAL_COMMUNITY): Payer: Self-pay | Admitting: Orthopedic Surgery

## 2012-04-09 ENCOUNTER — Encounter (HOSPITAL_COMMUNITY): Payer: Self-pay | Admitting: *Deleted

## 2012-04-09 ENCOUNTER — Inpatient Hospital Stay (HOSPITAL_COMMUNITY)
Admission: AD | Admit: 2012-04-09 | Discharge: 2012-04-09 | Disposition: A | Discharge status: Home / Self Care | Payer: Medicare Other | Source: Ambulatory Visit | Attending: Obstetrics & Gynecology | Admitting: Obstetrics & Gynecology

## 2012-04-09 DIAGNOSIS — N760 Acute vaginitis: Secondary | ICD-10-CM | POA: Insufficient documentation

## 2012-04-09 DIAGNOSIS — B9689 Other specified bacterial agents as the cause of diseases classified elsewhere: Secondary | ICD-10-CM | POA: Insufficient documentation

## 2012-04-09 DIAGNOSIS — A499 Bacterial infection, unspecified: Secondary | ICD-10-CM

## 2012-04-09 DIAGNOSIS — R109 Unspecified abdominal pain: Secondary | ICD-10-CM | POA: Insufficient documentation

## 2012-04-09 DIAGNOSIS — N949 Unspecified condition associated with female genital organs and menstrual cycle: Secondary | ICD-10-CM | POA: Insufficient documentation

## 2012-04-09 LAB — WET PREP, GENITAL: Yeast Wet Prep HPF POC: NONE SEEN

## 2012-04-09 MED ORDER — METRONIDAZOLE 500 MG PO TABS: 500.0000 mg | ORAL_TABLET | Freq: Two times a day (BID) | ORAL | Status: AC

## 2012-04-09 NOTE — MAU Note (Signed)
Pt in c/o lower abdominal cramping x2 weeks.  Reports a milky discharge with foul odor.  LMP 03/27/12

## 2012-04-09 NOTE — MAU Provider Note (Signed)
History     CSN: [id number]  Arrival date and time: 04/09/12 2130   None     Chief Complaint  Patient presents with  . Vaginal Discharge  . Abdominal Pain   HPI 31 y.o. [id number] with vaginal discharge with odor x 2 weeks.    Past Medical History  Diagnosis Date  . Hypertension   . Complication of anesthesia     "Oxygen level drops"  . Kidney stones 2010  . Constipation   . Anemia     during pregancy  . 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  Has been seen at [facility]  . Depression     Past Surgical History  Procedure Date  . Cesarean section 2006, 2011  . Cystectomy   . Lympectomy   . Lymp glands removed--under arm bil   . Cholecystectomy 06/2011  . Tubal ligation 2011  . Multiple tooth extractions 2003    and wisdom teeth  . Pilonidial cyst removed 1990's  . Carpal tunnel release 02/03/2012    Procedure: CARPAL TUNNEL RELEASE;  Surgeon: Cammy Copa, MD;  Location: [facility] OR;  Service: Orthopedics;  Laterality: Left;  left carpal tunnel release  . Ganglion cyst excision 02/03/2012    Procedure: REMOVAL GANGLION OF WRIST;  Surgeon: Cammy Copa, MD;  Location: [facility] OR;  Service: Orthopedics;  Laterality: Left;  left dorsal ganglion cyst excision    Family History  Problem Relation Age of Onset  . Kidney cancer Father   . Anesthesia problems Neg Hx     History  Substance Use Topics  . Smoking status: Former Smoker    Quit date: 08/02/2011  . Smokeless tobacco: Not on file  . Alcohol Use: No    Allergies:  Allergies  Allergen Reactions  . Penicillins Hives    No prescriptions prior to admission    Review of Systems  Constitutional: Negative.   Respiratory: Negative.   Cardiovascular: Negative.   Gastrointestinal: Negative for nausea, vomiting, abdominal pain, diarrhea and constipation.  Genitourinary: Negative for dysuria, urgency, frequency, hematuria and flank pain.       Negative for vaginal bleeding, +  discharge   Musculoskeletal: Negative.   Neurological: Negative.   Psychiatric/Behavioral: Negative.    Physical Exam   Blood pressure 125/81, pulse 100, temperature 97.3 F (36.3 C), temperature source Oral, resp. rate 18, height 5\' 1"  (1.549 m), weight 290 lb (131.543 kg), last menstrual period 03/27/2012.  Physical Exam  Constitutional: She is oriented to person, place, and time. She appears well-developed and well-nourished. No distress.  HENT:  Head: Normocephalic and atraumatic.  Cardiovascular: Normal rate, regular rhythm and normal heart sounds.   Respiratory: Effort normal. No respiratory distress.  GI: Soft. She exhibits no distension and no mass. There is no tenderness. There is no rebound and no guarding.  Genitourinary: There is no rash or lesion on the right labia. There is no rash or lesion on the left labia. Cervix exhibits no discharge and no friability. No erythema, tenderness or bleeding around the vagina. No vaginal discharge found.  Neurological: She is alert and oriented to person, place, and time.  Skin: Skin is warm and dry.  Psychiatric: She has a normal mood and affect.    MAU Course  Procedures Results for orders placed during the hospital encounter of 04/09/12 (from the past 24 hour(s))  POCT PREGNANCY, URINE     Status: Normal   Collection Time   04/09/12 10:04  AM      Component Value Range   Preg Test, Ur NEGATIVE  NEGATIVE   WET PREP, GENITAL     Status: Abnormal   Collection Time   04/09/12 10:45 AM      Component Value Range   Yeast Wet Prep HPF POC NONE SEEN  NONE SEEN    Trich, Wet Prep NONE SEEN  NONE SEEN    Clue Cells Wet Prep HPF POC FEW (*) NONE SEEN    WBC, Wet Prep HPF POC FEW (*) NONE SEEN      Assessment and Plan  31 y.o. [id number] with BV  Rx Flagyl   Melinda Ruiz 04/09/2012, 10:36 AM

## 2012-04-09 NOTE — Discharge Instructions (Signed)
Bacterial Vaginosis Bacterial vaginosis (BV) is a vaginal infection where the normal balance of bacteria in the vagina is disrupted. The normal balance is then replaced by an overgrowth of certain bacteria. There are several different kinds of bacteria that can cause BV. BV is the most common vaginal infection in women of childbearing age. CAUSES   The cause of BV is not fully understood. BV develops when there is an increase or imbalance of harmful bacteria.   Some activities or behaviors can upset the normal balance of bacteria in the vagina and put women at increased risk including:   Having a new sex partner or multiple sex partners.   Douching.   Using an intrauterine device (IUD) for contraception.   It is not clear what role sexual activity plays in the development of BV. However, women that have never had sexual intercourse are rarely infected with BV.  Women do not get BV from toilet seats, bedding, swimming pools or from touching objects around them.  SYMPTOMS   Grey vaginal discharge.   A fish-like odor with discharge, especially after sexual intercourse.   Itching or burning of the vagina and vulva.   Burning or pain with urination.   Some women have no signs or symptoms at all.  DIAGNOSIS  Your caregiver must examine the vagina for signs of BV. Your caregiver will perform lab tests and look at the sample of vaginal fluid through a microscope. They will look for bacteria and abnormal cells (clue cells), a pH test higher than 4.5, and a positive amine test all associated with BV.  RISKS AND COMPLICATIONS   Pelvic inflammatory disease (PID).   Infections following gynecology surgery.   Developing HIV.   Developing herpes virus.  TREATMENT  Sometimes BV will clear up without treatment. However, all women with symptoms of BV should be treated to avoid complications, especially if gynecology surgery is planned. Female partners generally do not need to be treated. However,  BV may spread between female sex partners so treatment is helpful in preventing a recurrence of BV.   BV may be treated with antibiotics. The antibiotics come in either pill or vaginal cream forms. Either can be used with nonpregnant or pregnant women, but the recommended dosages differ. These antibiotics are not harmful to the baby.   BV can recur after treatment. If this happens, a second round of antibiotics will often be prescribed.   Treatment is important for pregnant women. If not treated, BV can cause a premature delivery, especially for a pregnant woman who had a premature birth in the past. All pregnant women who have symptoms of BV should be checked and treated.   For chronic reoccurrence of BV, treatment with a type of prescribed gel vaginally twice a week is helpful.  HOME CARE INSTRUCTIONS   Finish all medication as directed by your caregiver.   Do not have sex until treatment is completed.   Tell your sexual partner that you have a vaginal infection. They should see their caregiver and be treated if they have problems, such as a mild rash or itching.   Practice safe sex. Use condoms. Only have 1 sex partner.  PREVENTION  Basic prevention steps can help reduce the risk of upsetting the natural balance of bacteria in the vagina and developing BV:  Do not have sexual intercourse (be abstinent).   Do not douche.   Use all of the medicine prescribed for treatment of BV, even if the signs and symptoms go away.     Tell your sex partner if you have BV. That way, they can be treated, if needed, to prevent reoccurrence.  SEEK MEDICAL CARE IF:   Your symptoms are not improving after 3 days of treatment.   You have increased discharge, pain, or fever.  MAKE SURE YOU:   Understand these instructions.   Will watch your condition.   Will get help right away if you are not doing well or get worse.  FOR MORE INFORMATION  Division of STD Prevention (DSTDP), Centers for Disease  Control and Prevention: www.cdc.gov/std American Social Health Association (ASHA): www.ashastd.org  Document Released: 12/15/2005 Document Revised: 12/04/2011 Document Reviewed: 06/07/2009 ExitCare Patient Information 2012 [employer]. 

## 2012-04-10 LAB — GC/CHLAMYDIA PROBE AMP, GENITAL
Chlamydia, DNA Probe: NEGATIVE
GC Probe Amp, Genital: NEGATIVE

## 2012-04-13 NOTE — MAU Provider Note (Signed)
Attestation of Attending Supervision of Advanced Practitioner: Evaluation and management procedures were performed by the [facility] Fellow/PA/CNM/NP under my supervision and collaboration. Chart reviewed, and agree with management and plan.  Jaynie Collins, M.D. 04/13/2012 11:33 AM

## 2012-06-08 ENCOUNTER — Other Ambulatory Visit (HOSPITAL_COMMUNITY): Payer: Self-pay | Admitting: Internal Medicine

## 2012-06-08 ENCOUNTER — Ambulatory Visit (HOSPITAL_COMMUNITY)
Admission: RE | Admit: 2012-06-08 | Discharge: 2012-06-08 | Disposition: A | Discharge status: Home / Self Care | Payer: Medicare Other | Source: Ambulatory Visit | Attending: Internal Medicine | Admitting: Internal Medicine

## 2012-06-08 DIAGNOSIS — R0602 Shortness of breath: Secondary | ICD-10-CM

## 2012-06-08 DIAGNOSIS — R5383 Other fatigue: Secondary | ICD-10-CM

## 2012-06-08 DIAGNOSIS — R5381 Other malaise: Secondary | ICD-10-CM

## 2012-06-08 DIAGNOSIS — N644 Mastodynia: Secondary | ICD-10-CM | POA: Insufficient documentation

## 2012-12-13 ENCOUNTER — Inpatient Hospital Stay (HOSPITAL_COMMUNITY): Admission: RE | Admit: 2012-12-13 | Discharge: 2012-12-13 | Payer: Medicare Other | Source: Ambulatory Visit

## 2012-12-15 ENCOUNTER — Other Ambulatory Visit: Payer: Self-pay | Admitting: Orthopedic Surgery

## 2012-12-28 ENCOUNTER — Encounter (HOSPITAL_COMMUNITY): Payer: Self-pay | Admitting: Pharmacy Technician

## 2013-01-07 ENCOUNTER — Encounter (HOSPITAL_COMMUNITY): Payer: Self-pay

## 2013-01-07 ENCOUNTER — Encounter (HOSPITAL_COMMUNITY)
Admission: RE | Admit: 2013-01-07 | Discharge: 2013-01-07 | Disposition: A | Discharge status: Home / Self Care | Payer: Medicare Other | Source: Ambulatory Visit | Attending: Orthopedic Surgery | Admitting: Orthopedic Surgery

## 2013-01-07 HISTORY — DX: Headache: R51

## 2013-01-07 LAB — SURGICAL PCR SCREEN: Staphylococcus aureus: NEGATIVE

## 2013-01-07 LAB — CBC
HCT: 39.5 % (ref 36.0–46.0)
Hemoglobin: 12.7 g/dL (ref 12.0–15.0)
MCH: 27.9 pg (ref 26.0–34.0)
MCV: 86.8 fL (ref 78.0–100.0)
Platelets: 378 10*3/uL (ref 150–400)
RBC: 4.55 MIL/uL (ref 3.87–5.11)
WBC: 10 10*3/uL (ref 4.0–10.5)

## 2013-01-07 LAB — BASIC METABOLIC PANEL
CO2: 26 mEq/L (ref 19–32)
Calcium: 9.3 mg/dL (ref 8.4–10.5)
Chloride: 101 mEq/L (ref 96–112)
Glucose, Bld: 67 mg/dL — ABNORMAL LOW (ref 70–99)
Potassium: 3.9 mEq/L (ref 3.5–5.1)
Sodium: 139 mEq/L (ref 135–145)

## 2013-01-07 NOTE — Progress Notes (Signed)
Primary Physician - [facility] -- [facility] Does not have a cardiologist - no cardiac testing. Had ekg last year cannot remember date

## 2013-01-07 NOTE — Progress Notes (Signed)
01/07/13 1121  OBSTRUCTIVE SLEEP APNEA  Do you snore loudly (loud enough to be heard through closed doors)?  1  Do you often feel tired, fatigued, or sleepy during the daytime? 1  Has anyone observed you stop breathing during your sleep? 0  Do you have, or are you being treated for high blood pressure? 1  BMI more than 35 kg/m2? 1  Age over 31 years old? 0  Neck circumference greater than 40 cm/18 inches? 0  Gender: 0  Obstructive Sleep Apnea Score 4   Score 4 or greater  Results sent to PCP

## 2013-01-07 NOTE — Pre-Procedure Instructions (Signed)
Melinda Ruiz  01/07/2013   Your procedure is scheduled on:  Tuesday, January 14th  Report to [facility] at 0530 AM.  Call this number if you have problems the morning of surgery: [phone]   Remember:   Do not eat food or drink liquids after midnight.    Take these medicines the morning of surgery with A SIP OF WATER: tylenol if needed, abilify   Do not wear jewelry, make-up or nail polish.  Do not wear lotions, powders, or perfumes. You may not wear deodorant.  Do not shave 48 hours prior to surgery.  Do not bring valuables to the hospital.  Contacts, dentures or bridgework may not be worn into surgery.  Leave suitcase in the car. After surgery it may be brought to your room.  For patients admitted to the hospital, checkout time is 11:00 AM the day of  discharge.   Patients discharged the day of surgery will not be allowed to drive  home.    Special Instructions: Shower using CHG 2 nights before surgery and the night before surgery.  If you shower the day of surgery use CHG.  Use special wash - you have one bottle of CHG for all showers.  You should use approximately 1/3 of the bottle for each shower.   Please read over the following fact sheets that you were given: Pain Booklet, Coughing and Deep Breathing, MRSA Information and Surgical Site Infection Prevention

## 2013-01-10 MED ORDER — CLINDAMYCIN PHOSPHATE 900 MG/50ML IV SOLN
900.0000 mg | INTRAVENOUS | Status: AC
  Administered 2013-01-11: 900 mg via INTRAVENOUS
  Filled 2013-01-10: qty 50

## 2013-01-11 ENCOUNTER — Encounter (HOSPITAL_COMMUNITY)
Admission: RE | Disposition: A | Discharge status: Home / Self Care | Payer: Self-pay | Source: Ambulatory Visit | Attending: Orthopedic Surgery

## 2013-01-11 ENCOUNTER — Encounter (HOSPITAL_COMMUNITY): Payer: Self-pay | Admitting: Certified Registered"

## 2013-01-11 ENCOUNTER — Ambulatory Visit (HOSPITAL_COMMUNITY): Payer: Medicare Other | Admitting: Certified Registered"

## 2013-01-11 ENCOUNTER — Ambulatory Visit (HOSPITAL_COMMUNITY)
Admission: RE | Admit: 2013-01-11 | Discharge: 2013-01-11 | Disposition: A | Discharge status: Home / Self Care | Payer: Medicare Other | Source: Ambulatory Visit | Attending: Orthopedic Surgery | Admitting: Orthopedic Surgery

## 2013-01-11 ENCOUNTER — Encounter (HOSPITAL_COMMUNITY): Payer: Self-pay | Admitting: *Deleted

## 2013-01-11 DIAGNOSIS — Z8051 Family history of malignant neoplasm of kidney: Secondary | ICD-10-CM | POA: Insufficient documentation

## 2013-01-11 DIAGNOSIS — Z9089 Acquired absence of other organs: Secondary | ICD-10-CM | POA: Insufficient documentation

## 2013-01-11 DIAGNOSIS — Z880 Allergy status to penicillin: Secondary | ICD-10-CM | POA: Insufficient documentation

## 2013-01-11 DIAGNOSIS — F319 Bipolar disorder, unspecified: Secondary | ICD-10-CM | POA: Insufficient documentation

## 2013-01-11 DIAGNOSIS — G5600 Carpal tunnel syndrome, unspecified upper limb: Secondary | ICD-10-CM

## 2013-01-11 DIAGNOSIS — Z87442 Personal history of urinary calculi: Secondary | ICD-10-CM | POA: Insufficient documentation

## 2013-01-11 DIAGNOSIS — F209 Schizophrenia, unspecified: Secondary | ICD-10-CM | POA: Insufficient documentation

## 2013-01-11 DIAGNOSIS — I10 Essential (primary) hypertension: Secondary | ICD-10-CM | POA: Insufficient documentation

## 2013-01-11 HISTORY — PX: CARPAL TUNNEL RELEASE: SHX101

## 2013-01-11 SURGERY — CARPAL TUNNEL RELEASE
Anesthesia: General | Site: Wrist | Laterality: Right | Wound class: Clean

## 2013-01-11 MED ORDER — DIPHENHYDRAMINE HCL 50 MG/ML IJ SOLN
6.2500 mg | Freq: Once | INTRAMUSCULAR | Status: AC
  Administered 2013-01-11: 6.25 mg via INTRAVENOUS

## 2013-01-11 MED ORDER — PROPOFOL 10 MG/ML IV BOLUS
INTRAVENOUS | Status: DC | PRN
  Administered 2013-01-11: 200 mg via INTRAVENOUS

## 2013-01-11 MED ORDER — BUPIVACAINE HCL (PF) 0.25 % IJ SOLN
INTRAMUSCULAR | Status: AC
  Filled 2013-01-11: qty 30

## 2013-01-11 MED ORDER — CHLORHEXIDINE GLUCONATE 4 % EX LIQD: 60.0000 mL | Freq: Once | CUTANEOUS | Status: DC

## 2013-01-11 MED ORDER — OXYCODONE HCL 5 MG PO TABS: 5.0000 mg | ORAL_TABLET | Freq: Once | ORAL | Status: DC | PRN

## 2013-01-11 MED ORDER — HYDROMORPHONE HCL PF 1 MG/ML IJ SOLN
0.2500 mg | INTRAMUSCULAR | Status: DC | PRN
  Administered 2013-01-11 (×2): 0.5 mg via INTRAVENOUS

## 2013-01-11 MED ORDER — ARTIFICIAL TEARS OP OINT
TOPICAL_OINTMENT | OPHTHALMIC | Status: DC | PRN
  Administered 2013-01-11: 1 via OPHTHALMIC

## 2013-01-11 MED ORDER — HYDROMORPHONE HCL PF 1 MG/ML IJ SOLN
INTRAMUSCULAR | Status: AC
  Filled 2013-01-11: qty 1

## 2013-01-11 MED ORDER — OXYCODONE HCL 5 MG/5ML PO SOLN: 5.0000 mg | Freq: Once | ORAL | Status: DC | PRN

## 2013-01-11 MED ORDER — FENTANYL CITRATE 0.05 MG/ML IJ SOLN
INTRAMUSCULAR | Status: DC | PRN
  Administered 2013-01-11: 100 ug via INTRAVENOUS

## 2013-01-11 MED ORDER — LIDOCAINE HCL (CARDIAC) 20 MG/ML IV SOLN
INTRAVENOUS | Status: DC | PRN
  Administered 2013-01-11: 60 mg via INTRAVENOUS

## 2013-01-11 MED ORDER — LACTATED RINGERS IV SOLN
INTRAVENOUS | Status: DC | PRN
  Administered 2013-01-11: 08:00:00 via INTRAVENOUS

## 2013-01-11 MED ORDER — DIPHENHYDRAMINE HCL 50 MG/ML IJ SOLN
INTRAMUSCULAR | Status: AC
  Filled 2013-01-11: qty 1

## 2013-01-11 MED ORDER — BUPIVACAINE HCL (PF) 0.25 % IJ SOLN
INTRAMUSCULAR | Status: DC | PRN
  Administered 2013-01-11: 10 mL

## 2013-01-11 MED ORDER — HYDROCODONE-ACETAMINOPHEN 10-325 MG PO TABS: 1.0000 | ORAL_TABLET | Freq: Four times a day (QID) | ORAL | Status: DC | PRN

## 2013-01-11 SURGICAL SUPPLY — 41 items
BANDAGE ELASTIC 3 VELCRO ST LF (GAUZE/BANDAGES/DRESSINGS) ×2 IMPLANT
BANDAGE ELASTIC 4 VELCRO ST LF (GAUZE/BANDAGES/DRESSINGS) ×2 IMPLANT
BANDAGE GAUZE ELAST BULKY 4 IN (GAUZE/BANDAGES/DRESSINGS) ×2 IMPLANT
BNDG ESMARK 4X9 LF (GAUZE/BANDAGES/DRESSINGS) IMPLANT
CLOTH BEACON ORANGE TIMEOUT ST (SAFETY) ×2 IMPLANT
CORDS BIPOLAR (ELECTRODE) ×2 IMPLANT
COVER SURGICAL LIGHT HANDLE (MISCELLANEOUS) ×2 IMPLANT
CUFF TOURNIQUET SINGLE 18IN (TOURNIQUET CUFF) ×2 IMPLANT
CUFF TOURNIQUET SINGLE 24IN (TOURNIQUET CUFF) ×2 IMPLANT
DRAPE SURG 17X23 STRL (DRAPES) ×2 IMPLANT
DRSG PAD ABDOMINAL 8X10 ST (GAUZE/BANDAGES/DRESSINGS) ×2 IMPLANT
DURAPREP 26ML APPLICATOR (WOUND CARE) ×2 IMPLANT
GAUZE XEROFORM 1X8 LF (GAUZE/BANDAGES/DRESSINGS) ×2 IMPLANT
GLOVE BIOGEL PI IND STRL 8 (GLOVE) ×1 IMPLANT
GLOVE BIOGEL PI INDICATOR 8 (GLOVE) ×1
GLOVE SURG ORTHO 8.0 STRL STRW (GLOVE) ×2 IMPLANT
GOWN PREVENTION PLUS LG XLONG (DISPOSABLE) ×2 IMPLANT
GOWN PREVENTION PLUS XLARGE (GOWN DISPOSABLE) ×2 IMPLANT
GOWN STRL NON-REIN LRG LVL3 (GOWN DISPOSABLE) ×4 IMPLANT
KIT BASIN OR (CUSTOM PROCEDURE TRAY) ×2 IMPLANT
KIT ROOM TURNOVER OR (KITS) ×2 IMPLANT
LOOP VESSEL MAXI BLUE (MISCELLANEOUS) IMPLANT
NEEDLE HYPO 25GX1X1/2 BEV (NEEDLE) IMPLANT
NS IRRIG 1000ML POUR BTL (IV SOLUTION) ×2 IMPLANT
PACK ORTHO EXTREMITY (CUSTOM PROCEDURE TRAY) ×2 IMPLANT
PAD ARMBOARD 7.5X6 YLW CONV (MISCELLANEOUS) ×4 IMPLANT
PAD CAST 4YDX4 CTTN HI CHSV (CAST SUPPLIES) ×1 IMPLANT
PADDING CAST COTTON 4X4 STRL (CAST SUPPLIES) ×1
SPONGE GAUZE 4X4 12PLY (GAUZE/BANDAGES/DRESSINGS) ×2 IMPLANT
SUCTION FRAZIER TIP 10 FR DISP (SUCTIONS) IMPLANT
SUT ETHILON 3 0 PS 1 (SUTURE) ×2 IMPLANT
SUT VIC AB 2-0 CT1 27 (SUTURE) ×1
SUT VIC AB 2-0 CT1 TAPERPNT 27 (SUTURE) ×1 IMPLANT
SUT VIC AB 3-0 FS2 27 (SUTURE) IMPLANT
SYR CONTROL 10ML LL (SYRINGE) IMPLANT
SYSTEM CHEST DRAIN TLS 7FR (DRAIN) IMPLANT
TOWEL OR 17X24 6PK STRL BLUE (TOWEL DISPOSABLE) ×2 IMPLANT
TOWEL OR 17X26 10 PK STRL BLUE (TOWEL DISPOSABLE) ×2 IMPLANT
TUBE CONNECTING 12X1/4 (SUCTIONS) IMPLANT
UNDERPAD 30X30 INCONTINENT (UNDERPADS AND DIAPERS) ×2 IMPLANT
WATER STERILE IRR 1000ML POUR (IV SOLUTION) ×2 IMPLANT

## 2013-01-11 NOTE — Transfer of Care (Signed)
Immediate Anesthesia Transfer of Care Note  Patient: Melinda Ruiz  Procedure(s) Performed: Procedure(s) (LRB) with comments: CARPAL TUNNEL RELEASE (Right) - Hand Set, Small Retractors, Lead Hand  Patient Location: PACU  Anesthesia Type:General  Level of Consciousness: awake, alert  and oriented  Airway & Oxygen Therapy: Patient Spontanous Breathing and Patient connected to face mask oxygen  Post-op Assessment: Report given to PACU RN, Post -op Vital signs reviewed and stable and Patient moving all extremities X 4  Post vital signs: Reviewed and stable  Complications: No apparent anesthesia complications

## 2013-01-11 NOTE — Anesthesia Postprocedure Evaluation (Signed)
  Anesthesia Post-op Note  Patient: Melinda Ruiz  Procedure(s) Performed: Procedure(s) (LRB) with comments: CARPAL TUNNEL RELEASE (Right) - Hand Set, Small Retractors, Lead Hand  Patient Location: PACU  Anesthesia Type:General  Level of Consciousness: awake and alert   Airway and Oxygen Therapy: Patient Spontanous Breathing and Patient connected to nasal cannula oxygen  Post-op Pain: mild  Post-op Assessment: Post-op Vital signs reviewed, Patient's Cardiovascular Status Stable, Respiratory Function Stable, Patent Airway and No signs of Nausea or vomiting  Post-op Vital Signs: Reviewed and stable  Complications: No apparent anesthesia complications

## 2013-01-11 NOTE — Anesthesia Preprocedure Evaluation (Addendum)
Anesthesia Evaluation  Patient identified by MRN, date of birth, ID band Patient awake    Reviewed: Allergy & Precautions, H&P , NPO status , Patient's Chart, lab work & pertinent test results  Airway Mallampati: II TM Distance: >3 FB Neck ROM: Full    Dental No notable dental hx. (+) Teeth Intact and Dental Advisory Given   Pulmonary neg pulmonary ROS,  breath sounds clear to auscultation  Pulmonary exam normal       Cardiovascular hypertension, On Medications negative cardio ROS  Rhythm:Regular Rate:Normal     Neuro/Psych  Headaches, PSYCHIATRIC DISORDERS    GI/Hepatic negative GI ROS, Neg liver ROS,   Endo/Other  Morbid obesity  Renal/GU Renal diseasenegative Renal ROS  negative genitourinary   Musculoskeletal   Abdominal   Peds  Hematology negative hematology ROS (+)   Anesthesia Other Findings   Reproductive/Obstetrics negative OB ROS                           Anesthesia Physical Anesthesia Plan  ASA: III  Anesthesia Plan: General   Post-op Pain Management:    Induction: Intravenous  Airway Management Planned: LMA and Oral ETT  Additional Equipment:   Intra-op Plan:   Post-operative Plan: Extubation in OR  Informed Consent: I have reviewed the patients History and Physical, chart, labs and discussed the procedure including the risks, benefits and alternatives for the proposed anesthesia with the patient or authorized representative who has indicated his/her understanding and acceptance.   Dental advisory given  Plan Discussed with: CRNA  Anesthesia Plan Comments:         Anesthesia Quick Evaluation

## 2013-01-11 NOTE — H&P (Signed)
Melinda Ruiz is an 32 y.o. female.   Chief Complaint: Right wrist pain HPI: Melinda Ruiz is a 32 year old patient with right wrist pain. She describes numbness and tingling constantly in the right hand. This is been refractory to nonoperative management including medication splinting and activity modification. She has carpal tunnel syndrome by EMG nerve study. She had left carpal tunnel release a year ago with good result. She presents now for operative management of right carpal tall after failure of conservative management and expiration risk and benefits.  Past Medical History  Diagnosis Date  . Complication of anesthesia     "Oxygen level drops"  . Kidney stones 2010  . Constipation   . Anemia     during pregancy  . 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  Has been seen at [facility]  . Depression   . Hypertension     takes meds daily  . Headache     Past Surgical History  Procedure Date  . Cesarean section 2006, 2011  . Cystectomy   . Lympectomy   . Lymp glands removed--under arm bil   . Cholecystectomy 06/2011  . Tubal ligation 2011  . Multiple tooth extractions 2003    and wisdom teeth  . Pilonidial cyst removed 1990's  . Carpal tunnel release 02/03/2012    Procedure: CARPAL TUNNEL RELEASE;  Surgeon: Cammy Copa, MD;  Location: [facility] OR;  Service: Orthopedics;  Laterality: Left;  left carpal tunnel release  . Ganglion cyst excision 02/03/2012    Procedure: REMOVAL GANGLION OF WRIST;  Surgeon: Cammy Copa, MD;  Location: [facility] OR;  Service: Orthopedics;  Laterality: Left;  left dorsal ganglion cyst excision    Family History  Problem Relation Age of Onset  . Kidney cancer Father   . Anesthesia problems Neg Hx    Social History:  reports that she quit smoking about 17 months ago. She does not have any smokeless tobacco history on file. She reports that she does not drink alcohol or use illicit drugs.  Allergies:  Allergies    Allergen Reactions  . Penicillins Hives    Medications Prior to Admission  Medication Sig Dispense Refill  . ARIPiprazole (ABILIFY) 10 MG tablet Take 10 mg by mouth daily.      Marland Kitchen ibuprofen (ADVIL,MOTRIN) 200 MG tablet Take 400 mg by mouth daily as needed. For pain      . PRESCRIPTION MEDICATION Take 1 tablet by mouth daily. Medication:Hydrochlorthiazide??      . acetaminophen (TYLENOL) 500 MG tablet Take 1,000 mg by mouth 3 (three) times daily as needed. For pain        No results found for this or any previous visit (from the past 48 hour(s)). No results found.  Review of Systems  Constitutional: Negative.   HENT: Negative.   Eyes: Negative.   Respiratory: Negative.   Cardiovascular: Negative.   Gastrointestinal: Negative.   Genitourinary: Negative.   Musculoskeletal: Positive for joint pain.  Skin: Negative.   Neurological: Negative.   Endo/Heme/Allergies: Negative.   Psychiatric/Behavioral: Negative.     Blood pressure 137/84, pulse 89, temperature 98 F (36.7 C), temperature source Oral, resp. rate 20, last menstrual period 01/02/2013, SpO2 96.00%. Physical Exam  Constitutional: She appears well-developed.  HENT:  Head: Normocephalic.  Eyes: Pupils are equal, round, and reactive to light.  Neck: Normal range of motion.  Cardiovascular: Normal rate.   Respiratory: Effort normal.  GI: Soft.  Neurological: She is  alert.  Skin: Skin is warm.   right wrist demonstrates positive carpal tunnel compression testing [place] gas around the wrist circumferentially radial pulses intact EPL FPL interosseous intact does have paresthesias primarily radial distribution negative Tinel's in the cubital tunnel the right-hand side neck range of motion full  Assessment/Plan Impression is right carpal tunnel syndrome refractory to nonoperative management plan is for open right carpal tunnel release risk and benefits including but not limited to infection nerve vessel damage wrist stiffness  residual pain all discussed with patient we'll take her to 3 months to regain full grip strength. All questions answered we'll proceed with surgery  DEAN,GREGORY SCOTT 01/11/2013, 7:27 AM

## 2013-01-11 NOTE — Op Note (Signed)
NAME:  ADALIAH, HIEGEL            ACCOUNT NO.:  [id number]  MEDICAL RECORD NO.:  [id number]  LOCATION:  [facility]                         FACILITY:  [facility]  PHYSICIAN:  Burnard Bunting, M.D.    DATE OF BIRTH:  07/14/1981  DATE OF PROCEDURE: DATE OF DISCHARGE:                              OPERATIVE REPORT   PREOPERATIVE DIAGNOSIS:  Right carpal tunnel syndrome.  POSTOPERATIVE DIAGNOSIS:  Right carpal tunnel syndrome.  PROCEDURE:  Right carpal tunnel release.  SURGEONS:  Burnard Bunting, M.D.  ASSISTANT:  None.  ANESTHESIA:  General.  INDICATIONS:  Stephane Junkins is a patient with right carpal syndrome who presents for operative management, explanation of risks and benefits.  PROCEDURE IN DETAIL:  The patient was brought to the operating room, where general endotracheal anesthesia was induced.  Preoperative antibiotics were administered.  Time-out was called.  Right hand was prescrubbed with alcohol and Betadine, which was allowed to air dry, prepped with DuraPrep solution, and draped in a sterile manner.  The arm was elevated, exsanguinated with Esmarch wrap.  Total tourniquet time 12 minutes at 250 mmHg.  Incision was made at the intersection of Kaplan's cardinal line on the radial border of the fourth fingers is extended proximally to the proximal wrist flexion crease.  Skin and subcutaneous tissues were sharply divided.  The palmar fascia was divided.  Bleeding points were encountered, controlled with bipolar electrocautery.  The transverse carpal ligament was then incised longitudinally in its midsection 2-3 mm.  Right angle retractor was placed between the transverse carpal ligament and the median nerve.  The nerve was divided under direct visualization distally and proximally to the forearm fascia.  Motor branch inspected and found to be intact.  Thorough irrigation was performed.  Tourniquet was released.  Bleeding points were encountered and controlled with electrocautery.   Skin edges were anesthetized with Marcaine, 3-0 simple nylon suture used to close the skin edges.  Bulky splint applied.  The patient tolerated the procedure well without immediate complication.  Transferred to recovery room in stable condition.    Burnard Bunting, M.D.    GSD/MEDQ  D:  01/11/2013  T:  01/11/2013  Job:  [id number]

## 2013-01-11 NOTE — Brief Op Note (Signed)
01/11/2013  8:36 AM  PATIENT:  Melinda Ruiz  32 y.o. female  PRE-OPERATIVE DIAGNOSIS:  Rt. Carpal Tunnel Syndrome  POST-OPERATIVE DIAGNOSIS:  Rt. Carpal Tunnel Syndrome  PROCEDURE:  Procedure(s): CARPAL TUNNEL RELEASE  SURGEON:  Surgeon(s): Cammy Copa, MD  ASSISTANT:   ANESTHESIA:   general  EBL: 2 ml       BLOOD ADMINISTERED: none  DRAINS: none   LOCAL MEDICATIONS USED:  none  SPECIMEN:  No Specimen  COUNTS:  YES  TOURNIQUET:   Total Tourniquet Time Documented: Upper Arm (Right) - 12 minutes  DICTATION: .Other Dictation: Dictation Number [id number]  PLAN OF CARE: Discharge to home after PACU  PATIENT DISPOSITION:  PACU - hemodynamically stable

## 2013-01-13 ENCOUNTER — Encounter (HOSPITAL_COMMUNITY): Payer: Self-pay | Admitting: Orthopedic Surgery

## 2013-05-03 ENCOUNTER — Emergency Department (HOSPITAL_COMMUNITY)
Admission: EM | Admit: 2013-05-03 | Discharge: 2013-05-03 | Disposition: A | Discharge status: Home / Self Care | Payer: Medicare Other | Attending: Emergency Medicine | Admitting: Emergency Medicine

## 2013-05-03 ENCOUNTER — Encounter (HOSPITAL_COMMUNITY): Payer: Self-pay | Admitting: Emergency Medicine

## 2013-05-03 DIAGNOSIS — Z87442 Personal history of urinary calculi: Secondary | ICD-10-CM | POA: Insufficient documentation

## 2013-05-03 DIAGNOSIS — F319 Bipolar disorder, unspecified: Secondary | ICD-10-CM | POA: Insufficient documentation

## 2013-05-03 DIAGNOSIS — R519 Headache, unspecified: Secondary | ICD-10-CM

## 2013-05-03 DIAGNOSIS — Z880 Allergy status to penicillin: Secondary | ICD-10-CM | POA: Insufficient documentation

## 2013-05-03 DIAGNOSIS — Z79899 Other long term (current) drug therapy: Secondary | ICD-10-CM | POA: Insufficient documentation

## 2013-05-03 DIAGNOSIS — I10 Essential (primary) hypertension: Secondary | ICD-10-CM | POA: Insufficient documentation

## 2013-05-03 DIAGNOSIS — F209 Schizophrenia, unspecified: Secondary | ICD-10-CM | POA: Insufficient documentation

## 2013-05-03 DIAGNOSIS — Z8719 Personal history of other diseases of the digestive system: Secondary | ICD-10-CM | POA: Insufficient documentation

## 2013-05-03 DIAGNOSIS — R51 Headache: Secondary | ICD-10-CM | POA: Insufficient documentation

## 2013-05-03 DIAGNOSIS — R110 Nausea: Secondary | ICD-10-CM | POA: Insufficient documentation

## 2013-05-03 DIAGNOSIS — H53149 Visual discomfort, unspecified: Secondary | ICD-10-CM | POA: Insufficient documentation

## 2013-05-03 DIAGNOSIS — D649 Anemia, unspecified: Secondary | ICD-10-CM

## 2013-05-03 DIAGNOSIS — Z87891 Personal history of nicotine dependence: Secondary | ICD-10-CM | POA: Insufficient documentation

## 2013-05-03 DIAGNOSIS — Z8679 Personal history of other diseases of the circulatory system: Secondary | ICD-10-CM | POA: Insufficient documentation

## 2013-05-03 LAB — CBC WITH DIFFERENTIAL/PLATELET
Basophils Relative: 0 % (ref 0–1)
Eosinophils Absolute: 0.2 10*3/uL (ref 0.0–0.7)
Eosinophils Relative: 2 % (ref 0–5)
Hemoglobin: 10.9 g/dL — ABNORMAL LOW (ref 12.0–15.0)
Lymphs Abs: 3.1 10*3/uL (ref 0.7–4.0)
MCH: 28.7 pg (ref 26.0–34.0)
MCHC: 34.4 g/dL (ref 30.0–36.0)
MCV: 83.4 fL (ref 78.0–100.0)
Monocytes Relative: 7 % (ref 3–12)
Platelets: 289 10*3/uL (ref 150–400)
RBC: 3.8 MIL/uL — ABNORMAL LOW (ref 3.87–5.11)

## 2013-05-03 LAB — POCT I-STAT, CHEM 8
BUN: 7 mg/dL (ref 6–23)
Calcium, Ion: 1.22 mmol/L (ref 1.12–1.23)
Chloride: 105 meq/L (ref 96–112)
Creatinine, Ser: 0.7 mg/dL (ref 0.50–1.10)
Glucose, Bld: 87 mg/dL (ref 70–99)
HCT: 34 % — ABNORMAL LOW (ref 36.0–46.0)
Hemoglobin: 11.6 g/dL — ABNORMAL LOW (ref 12.0–15.0)
Potassium: 3.9 meq/L (ref 3.5–5.1)
Sodium: 141 meq/L (ref 135–145)
TCO2: 27 mmol/L (ref 0–100)

## 2013-05-03 MED ORDER — KETOROLAC TROMETHAMINE 30 MG/ML IJ SOLN
30.0000 mg | Freq: Once | INTRAMUSCULAR | Status: AC
  Administered 2013-05-03: 30 mg via INTRAVENOUS
  Filled 2013-05-03: qty 1

## 2013-05-03 MED ORDER — METOCLOPRAMIDE HCL 5 MG/ML IJ SOLN
10.0000 mg | Freq: Once | INTRAMUSCULAR | Status: AC
  Administered 2013-05-03: 10 mg via INTRAVENOUS
  Filled 2013-05-03: qty 2

## 2013-05-03 MED ORDER — DIPHENHYDRAMINE HCL 50 MG/ML IJ SOLN
25.0000 mg | Freq: Once | INTRAMUSCULAR | Status: AC
  Administered 2013-05-03: 25 mg via INTRAVENOUS
  Filled 2013-05-03: qty 1

## 2013-05-03 MED ORDER — SODIUM CHLORIDE 0.9 % IV BOLUS (SEPSIS)
500.0000 mL | Freq: Once | INTRAVENOUS | Status: AC
  Administered 2013-05-03: 500 mL via INTRAVENOUS

## 2013-05-03 NOTE — ED Notes (Signed)
Pt c/o generalized frontal HA x 2 days after having stopped up right ear from swimming; pt sts has not had htn meds x 1 year

## 2013-05-03 NOTE — ED Provider Notes (Signed)
History     CSN: [id number]  Arrival date & time 05/03/13  4098   First MD Initiated Contact with Patient 05/03/13 [phone]      Chief Complaint  Patient presents with  . Headache    (Consider location/radiation/quality/duration/timing/severity/associated sxs/prior treatment) HPI Comments: 32 y.o. Female with family hx of migraine ha and PMHx of anemia and headaches presents today complaining of a headache she "woke up with" on Sunday after swimming on Saturday. Pain was an 8/10, constant throbbing, frontal headache that has not progressed in severity. Pt tried ibuprofen with no relief. Pt states that she made an appointment with her primary care doctor (Dr. Bruna Potter) regarding the headache, but it's not until Friday and she could not take the pain at all.   Admits nausea, photophobia Denies fevers, trauma, vomiting, visual disturbances, shortness of breath, chest pain, numbness, scalp tenderness, jaw claudication  Check w pt regarding nursing note re BP meds and pt states she take them.  Clogged up feeling of right ear from Saturday resolved days ago.   Patient is a 32 y.o. female presenting with headaches.  Headache Associated symptoms: nausea   Associated symptoms: no abdominal pain, no diarrhea, no dizziness, no fever, no neck pain, no neck stiffness, no numbness and no vomiting     Past Medical History  Diagnosis Date  . Complication of anesthesia     "Oxygen level drops"  . Kidney stones 2010  . Constipation   . Anemia     during pregancy  . 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  Has been [facility] at [facility]  . Depression   . Hypertension     takes meds daily  . Headache     Past Surgical History  Procedure Laterality Date  . Cesarean section  2006, 2011  . Cystectomy    . Lympectomy    . Lymp glands removed--under arm bil    . Cholecystectomy  06/2011  . Tubal ligation  2011  . Multiple tooth extractions  2003    and wisdom teeth  .  Pilonidial cyst removed  1990's  . Carpal tunnel release  02/03/2012    Procedure: CARPAL TUNNEL RELEASE;  Surgeon: Cammy Copa, MD;  Location: [facility] OR;  Service: Orthopedics;  Laterality: Left;  left carpal tunnel release  . Ganglion cyst excision  02/03/2012    Procedure: REMOVAL GANGLION OF WRIST;  Surgeon: Cammy Copa, MD;  Location: [facility] OR;  Service: Orthopedics;  Laterality: Left;  left dorsal ganglion cyst excision  . Carpal tunnel release  01/11/2013    Procedure: CARPAL TUNNEL RELEASE;  Surgeon: Cammy Copa, MD;  Location: [facility] OR;  Service: Orthopedics;  Laterality: Right;  Hand Set, Small Retractors, Lead Hand    Family History  Problem Relation Age of Onset  . Kidney cancer Father   . Anesthesia problems Neg Hx     History  Substance Use Topics  . Smoking status: Former Smoker    Quit date: 08/02/2011  . Smokeless tobacco: Not on file  . Alcohol Use: No    OB History   Grav Para Term Preterm Abortions TAB SAB Ect Mult Living   3 2 2  0 1 0 1 0 0 2      Review of Systems  Constitutional: Negative for fever and diaphoresis.  HENT: Negative for neck pain and neck stiffness.   Eyes: Negative for visual disturbance.  Respiratory: Negative for apnea, chest tightness and  shortness of breath.   Cardiovascular: Negative for chest pain and palpitations.  Gastrointestinal: Positive for nausea. Negative for vomiting, abdominal pain, diarrhea and constipation.  Genitourinary: Negative for dysuria.  Musculoskeletal: Negative for gait problem.  Skin: Negative for rash.  Neurological: Positive for headaches. Negative for dizziness, weakness, light-headedness and numbness.    Allergies  Penicillins  Home Medications   Current Outpatient Rx  Name  Route  Sig  Dispense  Refill  . ARIPiprazole (ABILIFY) 10 MG tablet   Oral   Take 10 mg by mouth daily.         Marland Kitchen HYDROcodone-acetaminophen (NORCO) 10-325 MG per tablet   Oral   Take 1 tablet by mouth every 6  (six) hours as needed for pain.   30 tablet   0   . ibuprofen (ADVIL,MOTRIN) 200 MG tablet   Oral   Take 400 mg by mouth daily as needed. For pain         . PRESCRIPTION MEDICATION   Oral   Take 1 tablet by mouth daily. Medication:Hydrochlorthiazide??           BP 137/87  Pulse 101  Temp(Src) 97.8 F (36.6 C) (Oral)  Resp 18  SpO2 98%  Physical Exam  Nursing note and vitals reviewed. Constitutional: She is oriented to person, place, and time. She appears well-developed and well-nourished. No distress.  HENT:  Head: Normocephalic and atraumatic.  Right Ear: Hearing and tympanic membrane normal. No drainage, swelling or tenderness. Tympanic membrane is not erythematous.  Left Ear: Hearing and tympanic membrane normal. No drainage, swelling or tenderness. Tympanic membrane is not erythematous.  Eyes: Conjunctivae and EOM are normal. Pupils are equal, round, and reactive to light.  Neck: Normal range of motion. Neck supple.  No meningeal signs  Cardiovascular: Normal rate, regular rhythm and normal heart sounds.  Exam reveals no gallop and no friction rub.   No murmur heard. Pulmonary/Chest: Effort normal and breath sounds normal. No respiratory distress. She has no wheezes. She has no rales. She exhibits no tenderness.  Abdominal: Soft. Bowel sounds are normal. She exhibits no distension. There is no tenderness. There is no rebound and no guarding.  Musculoskeletal: Normal range of motion. She exhibits no edema and no tenderness.  Normal strength in upper and lower extremities bilaterally including dorsiflexion and plantar flexion, strong and equal grip strength  Neurological: She is alert and oriented to person, place, and time. No cranial nerve deficit.  Speech is clear and goal oriented, follows commands Sensation normal to light touch Moves extremities without ataxia, coordination intact Normal gait and balance  Skin: Skin is warm and dry. She is not diaphoretic. No  erythema.  Psychiatric: She has a normal mood and affect.    ED Course  Procedures (including critical care time)  Labs Reviewed  CBC WITH DIFFERENTIAL - Abnormal; Notable for the following:    RBC 3.80 (*)    Hemoglobin 10.9 (*)    HCT 31.7 (*)    All other components within normal limits  POCT I-STAT, CHEM 8 - Abnormal; Notable for the following:    Hemoglobin 11.6 (*)    HCT 34.0 (*)    All other components within normal limits   No results found.   1. Headache in front of head   2. Anemia       MDM  Presentation is like pts typical HA and non concerning for [facility], ICH, meningitis, or temporal arteritis. Pt is afebrile with no focal neuro deficits,  nuchal rigidity, or change in vision. Will give HA cocktail, check CBC and chem 8 and re-evaluate.   Pt states pain has decreased from 8/10 to 1/10 with treatment and has no complaints at this time. Vitals are WNL and pt is ready to go home.   Discussed with pt that she should follow up with her primary care doc not only with regard to today's visit to discuss prophylactic medication for her headaches, but with regard to her anemia as her H/H was low today. Pt verbalizes understanding and is agreeable with plan to dc. Plan discussed with Dr. Rhunette Croft who is agreement with plan to discharge.    Glade Nurse, PA-C 05/03/13 1211  Medical screening examination/treatment/procedure(s) were performed by non-physician practitioner and as supervising physician I was immediately available for consultation/collaboration.  Derwood Kaplan, MD 05/03/13 [phone]

## 2013-06-08 ENCOUNTER — Other Ambulatory Visit (HOSPITAL_COMMUNITY)
Admission: RE | Admit: 2013-06-08 | Discharge: 2013-06-08 | Disposition: A | Discharge status: Home / Self Care | Payer: Medicare Other | Source: Ambulatory Visit | Attending: Obstetrics & Gynecology | Admitting: Obstetrics & Gynecology

## 2013-06-08 ENCOUNTER — Ambulatory Visit (INDEPENDENT_AMBULATORY_CARE_PROVIDER_SITE_OTHER): Payer: Medicare Other | Admitting: Obstetrics & Gynecology

## 2013-06-08 ENCOUNTER — Encounter: Payer: Self-pay | Admitting: Obstetrics & Gynecology

## 2013-06-08 VITALS — BP 129/89 | HR 94 | Temp 97.1°F | Ht 60.0 in | Wt 297.8 lb

## 2013-06-08 DIAGNOSIS — Z1151 Encounter for screening for human papillomavirus (HPV): Secondary | ICD-10-CM | POA: Insufficient documentation

## 2013-06-08 DIAGNOSIS — Z01419 Encounter for gynecological examination (general) (routine) without abnormal findings: Secondary | ICD-10-CM | POA: Insufficient documentation

## 2013-06-08 DIAGNOSIS — E669 Obesity, unspecified: Secondary | ICD-10-CM

## 2013-06-08 DIAGNOSIS — N920 Excessive and frequent menstruation with regular cycle: Secondary | ICD-10-CM | POA: Insufficient documentation

## 2013-06-08 DIAGNOSIS — N760 Acute vaginitis: Secondary | ICD-10-CM | POA: Insufficient documentation

## 2013-06-08 DIAGNOSIS — I10 Essential (primary) hypertension: Secondary | ICD-10-CM | POA: Insufficient documentation

## 2013-06-08 NOTE — Progress Notes (Signed)
Referred her for heavy periods since last child 8 years ago. States has irregular periods that last 4 days  And is heavy whole period and passed blood clots size of golf ball.

## 2013-06-08 NOTE — Patient Instructions (Signed)
Levonorgestrel intrauterine device (IUD) What is this medicine? LEVONORGESTREL IUD (LEE voe nor jes trel) is a contraceptive (birth control) device. The device is placed inside the uterus by a healthcare professional. It is used to prevent pregnancy and can also be used to treat heavy bleeding that occurs during your period. Depending on the device, it can be used for 3 to 5 years. This medicine may be used for other purposes; ask your health care provider or pharmacist if you have questions. What should I tell my health care provider before I take this medicine? They need to know if you have any of these conditions: -abnormal Pap smear -cancer of the breast, uterus, or cervix -diabetes -endometritis -genital or pelvic infection now or in the past -have more than one sexual partner or your partner has more than one partner -heart disease -history of an ectopic or tubal pregnancy -immune system problems -IUD in place -liver disease or tumor -problems with blood clots or take blood-thinners -use intravenous drugs -uterus of unusual shape -vaginal bleeding that has not been explained -an unusual or allergic reaction to levonorgestrel, other hormones, silicone, or polyethylene, medicines, foods, dyes, or preservatives -pregnant or trying to get pregnant -breast-feeding How should I use this medicine? This device is placed inside the uterus by a health care professional. Talk to your pediatrician regarding the use of this medicine in children. Special care may be needed. Overdosage: If you think you have taken too much of this medicine contact a poison control center or emergency room at once. NOTE: This medicine is only for you. Do not share this medicine with others. What if I miss a dose? This does not apply. What may interact with this medicine? Do not take this medicine with any of the following medications: -amprenavir -bosentan -fosamprenavir This medicine may also interact with  the following medications: -aprepitant -barbiturate medicines for inducing sleep or treating seizures -bexarotene -griseofulvin -medicines to treat seizures like carbamazepine, ethotoin, felbamate, oxcarbazepine, phenytoin, topiramate -modafinil -pioglitazone -rifabutin -rifampin -rifapentine -some medicines to treat HIV infection like atazanavir, indinavir, lopinavir, nelfinavir, tipranavir, ritonavir -St. John's wort -warfarin This list may not describe all possible interactions. Give your health care provider a list of all the medicines, herbs, non-prescription drugs, or dietary supplements you use. Also tell them if you smoke, drink alcohol, or use illegal drugs. Some items may interact with your medicine. What should I watch for while using this medicine? Visit your doctor or health care professional for regular check ups. See your doctor if you or your partner has sexual contact with others, becomes HIV positive, or gets a sexual transmitted disease. This product does not protect you against HIV infection (AIDS) or other sexually transmitted diseases. You can check the placement of the IUD yourself by reaching up to the top of your vagina with clean fingers to feel the threads. Do not pull on the threads. It is a good habit to check placement after each menstrual period. Call your doctor right away if you feel more of the IUD than just the threads or if you cannot feel the threads at all. The IUD may come out by itself. You may become pregnant if the device comes out. If you notice that the IUD has come out use a backup birth control method like condoms and call your health care provider. Using tampons will not change the position of the IUD and are okay to use during your period. What side effects may I notice from receiving this medicine?   Side effects that you should report to your doctor or health care professional as soon as possible: -allergic reactions like skin rash, itching or  hives, swelling of the face, lips, or tongue -fever, flu-like symptoms -genital sores -high blood pressure -no menstrual period for 6 weeks during use -pain, swelling, warmth in the leg -pelvic pain or tenderness -severe or sudden headache -signs of pregnancy -stomach cramping -sudden shortness of breath -trouble with balance, talking, or walking -unusual vaginal bleeding, discharge -yellowing of the eyes or skin Side effects that usually do not require medical attention (report to your doctor or health care professional if they continue or are bothersome): -acne -breast pain -change in sex drive or performance -changes in weight -cramping, dizziness, or faintness while the device is being inserted -headache -irregular menstrual bleeding within first 3 to 6 months of use -nausea This list may not describe all possible side effects. Call your doctor for medical advice about side effects. You may report side effects to FDA at 1-800-FDA-1088. Where should I keep my medicine? This does not apply. NOTE: This sheet is a summary. It may not cover all possible information. If you have questions about this medicine, talk to your doctor, pharmacist, or health care provider.  2013, [employer]vier/Gold Standard. (01/15/2012 1:54:04 PM) Menorrhagia Dysfunctional uterine bleeding is different from a normal menstrual period. When periods are heavy or there is more bleeding than is usual for you, it is called menorrhagia. It may be caused by hormonal imbalance, or physical, metabolic, or other problems. Examination is necessary in order that your caregiver may treat treatable causes. If this is a continuing problem, a D&C may be needed. That means that the cervix (the opening of the uterus or womb) is dilated (stretched larger) and the lining of the uterus is scraped out. The tissue scraped out is then examined under a microscope by a specialist (pathologist) to make sure there is nothing of concern that  needs further or more extensive treatment. HOME CARE INSTRUCTIONS   If medications were prescribed, take exactly as directed. Do not change or switch medications without consulting your caregiver.  Long term heavy bleeding may result in iron deficiency. Your caregiver may have prescribed iron pills. They help replace the iron your body lost from heavy bleeding. Take exactly as directed. Iron may cause constipation. If this becomes a problem, increase the bran, fruits, and roughage in your diet.  Do not take aspirin or medicines that contain aspirin one week before or during your menstrual period. Aspirin may make the bleeding worse.  If you need to change your sanitary pad or tampon more than once every 2 hours, stay in bed and rest as much as possible until the bleeding stops.  Eat well-balanced meals. Eat foods high in iron. Examples are leafy green vegetables, meat, liver, eggs, and whole grain breads and cereals. Do not try to lose weight until the abnormal bleeding has stopped and your blood iron level is back to normal. SEEK MEDICAL CARE IF:   You need to change your sanitary pad or tampon more than once an hour.  You develop nausea (feeling sick to your stomach) and vomiting, dizziness, or diarrhea while you are taking your medicine.  You have any problems that may be related to the medicine you are taking. SEEK IMMEDIATE MEDICAL CARE IF:   You have a fever.  You develop chills.  You develop severe bleeding or start to pass blood clots.  You feel dizzy or faint. MAKE SURE YOU:  Understand these instructions.  Will watch your condition.  Will get help right away if you are not doing well or get worse. Document Released: 12/15/2005 Document Revised: 03/08/2012 Document Reviewed: 08/04/2008 [facility] Patient Information 2014 [place].

## 2013-06-08 NOTE — Progress Notes (Signed)
Patient ID: Melinda Ruiz, female   DOB: [birth year], 32 y.o.   MRN: [id number]  Chief Complaint  Patient presents with  . Menorrhagia    HPI Melinda Ruiz is a 32 y.o. female.  [id number] Patient's last menstrual period was 05/23/2013. Heavy menses for 8 years getting worse, uses pad and tampon with frequent change.  HPI  Past Medical History  Diagnosis Date  . Complication of anesthesia     "Oxygen level drops"  . Kidney stones 2010  . Constipation   . Anemia     during pregancy  . 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  Has been seen at [facility]  . Depression   . Hypertension     takes meds daily  . JYNWGNFA(213.0)     Past Surgical History  Procedure Laterality Date  . Cesarean section  2006, 2011  . Cystectomy    . Lympectomy    . Lymp glands removed--under arm bil    . Cholecystectomy  06/2011  . Tubal ligation  2011  . Multiple tooth extractions  2003    and wisdom teeth  . Pilonidial cyst removed  1990's  . Carpal tunnel release  02/03/2012    Procedure: CARPAL TUNNEL RELEASE;  Surgeon: Cammy Copa, MD;  Location: [facility] OR;  Service: Orthopedics;  Laterality: Left;  left carpal tunnel release  . Ganglion cyst excision  02/03/2012    Procedure: REMOVAL GANGLION OF WRIST;  Surgeon: Cammy Copa, MD;  Location: [facility] OR;  Service: Orthopedics;  Laterality: Left;  left dorsal ganglion cyst excision  . Carpal tunnel release  01/11/2013    Procedure: CARPAL TUNNEL RELEASE;  Surgeon: Cammy Copa, MD;  Location: [facility] OR;  Service: Orthopedics;  Laterality: Right;  Hand Set, Small Retractors, Lead Hand    Family History  Problem Relation Age of Onset  . Kidney cancer Father   . Anesthesia problems Neg Hx     Social History History  Substance Use Topics  . Smoking status: Former Smoker    Quit date: 08/02/2011  . Smokeless tobacco: Never Used  . Alcohol Use: Yes     Comment: occasionally    Allergies  Allergen  Reactions  . Penicillins Hives    Current Outpatient Prescriptions  Medication Sig Dispense Refill  . buPROPion (WELLBUTRIN XL) 150 MG 24 hr tablet Take 150 mg by mouth every morning.      . hydrochlorothiazide (HYDRODIURIL) 25 MG tablet Take 25 mg by mouth daily.      Marland Kitchen ibuprofen (ADVIL,MOTRIN) 200 MG tablet Take 800 mg by mouth daily as needed for pain or headache. For pain      . QUEtiapine (SEROQUEL) 50 MG tablet Take 100 mg by mouth at bedtime.       No current facility-administered medications for this visit.    Review of Systems Review of Systems  Constitutional: Positive for fatigue.  Genitourinary: Positive for menstrual problem. Negative for dysuria and vaginal discharge (odor).  Skin: Negative for pallor.    Blood pressure 129/89, pulse 94, temperature 97.1 F (36.2 C), height 5' (1.524 m), weight 297 lb 12.8 oz (135.081 kg), last menstrual period 05/23/2013.  Physical Exam Physical Exam  Nursing note and vitals reviewed. Constitutional:  Morbid obesity  Pulmonary/Chest: Effort normal. No respiratory distress.  Abdominal: There is no tenderness.  Genitourinary: Vagina normal and uterus normal. No vaginal discharge found.  Pap done, no mass  Skin: Skin is warm  and dry.  Psychiatric: She has a normal mood and affect. Her behavior is normal.    Data Reviewed   Assessment    Menorrhagia     Plan    Candidate for OC, ablation or Mirena. Wants to return for Mirena after reviewing information.        Melinda Ruiz 06/08/2013, 3:21 PM

## 2013-06-19 ENCOUNTER — Emergency Department (HOSPITAL_COMMUNITY)
Admission: EM | Admit: 2013-06-19 | Discharge: 2013-06-19 | Disposition: A | Discharge status: Home / Self Care | Payer: Medicare Other | Attending: Emergency Medicine | Admitting: Emergency Medicine

## 2013-06-19 ENCOUNTER — Encounter (HOSPITAL_COMMUNITY): Payer: Self-pay | Admitting: Adult Health

## 2013-06-19 ENCOUNTER — Emergency Department (HOSPITAL_COMMUNITY): Payer: Medicare Other

## 2013-06-19 DIAGNOSIS — Z880 Allergy status to penicillin: Secondary | ICD-10-CM | POA: Insufficient documentation

## 2013-06-19 DIAGNOSIS — Z87891 Personal history of nicotine dependence: Secondary | ICD-10-CM | POA: Insufficient documentation

## 2013-06-19 DIAGNOSIS — Z8659 Personal history of other mental and behavioral disorders: Secondary | ICD-10-CM | POA: Insufficient documentation

## 2013-06-19 DIAGNOSIS — Z87442 Personal history of urinary calculi: Secondary | ICD-10-CM | POA: Insufficient documentation

## 2013-06-19 DIAGNOSIS — Y9389 Activity, other specified: Secondary | ICD-10-CM | POA: Insufficient documentation

## 2013-06-19 DIAGNOSIS — Y92410 Unspecified street and highway as the place of occurrence of the external cause: Secondary | ICD-10-CM | POA: Insufficient documentation

## 2013-06-19 DIAGNOSIS — T148XXA Other injury of unspecified body region, initial encounter: Secondary | ICD-10-CM

## 2013-06-19 DIAGNOSIS — IMO0002 Reserved for concepts with insufficient information to code with codable children: Secondary | ICD-10-CM | POA: Insufficient documentation

## 2013-06-19 DIAGNOSIS — Z8669 Personal history of other diseases of the nervous system and sense organs: Secondary | ICD-10-CM | POA: Insufficient documentation

## 2013-06-19 DIAGNOSIS — Z8719 Personal history of other diseases of the digestive system: Secondary | ICD-10-CM | POA: Insufficient documentation

## 2013-06-19 DIAGNOSIS — I10 Essential (primary) hypertension: Secondary | ICD-10-CM | POA: Insufficient documentation

## 2013-06-19 MED ORDER — NAPROXEN 500 MG PO TABS: 500.0000 mg | ORAL_TABLET | Freq: Two times a day (BID) | ORAL | Status: DC

## 2013-06-19 MED ORDER — IBUPROFEN 200 MG PO TABS
400.0000 mg | ORAL_TABLET | Freq: Once | ORAL | Status: AC
  Administered 2013-06-19: 400 mg via ORAL
  Filled 2013-06-19: qty 2

## 2013-06-19 MED ORDER — OXYCODONE-ACETAMINOPHEN 5-325 MG PO TABS
1.0000 | ORAL_TABLET | Freq: Once | ORAL | Status: AC
  Administered 2013-06-19: 1 via ORAL
  Filled 2013-06-19: qty 1

## 2013-06-19 MED ORDER — HYDROCODONE-ACETAMINOPHEN 5-325 MG PO TABS
1.0000 | ORAL_TABLET | Freq: Once | ORAL | Status: AC
  Administered 2013-06-19: 1 via ORAL
  Filled 2013-06-19: qty 1

## 2013-06-19 MED ORDER — CYCLOBENZAPRINE HCL 10 MG PO TABS: 10.0000 mg | ORAL_TABLET | Freq: Three times a day (TID) | ORAL | Status: DC | PRN

## 2013-06-19 MED ORDER — ONDANSETRON 4 MG PO TBDP
8.0000 mg | ORAL_TABLET | Freq: Once | ORAL | Status: AC
Start: 2013-06-19 — End: 2013-06-19
  Administered 2013-06-19: 8 mg via ORAL
  Filled 2013-06-19: qty 2
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
## 2013-06-19 NOTE — ED Notes (Signed)
Pt. returned from XR. 

## 2013-06-19 NOTE — ED Provider Notes (Signed)
  Medical screening examination/treatment/procedure(s) were performed by non-physician practitioner and as supervising physician I was immediately available for consultation/collaboration.    Gerhard Munch, MD 06/19/13 0730

## 2013-06-19 NOTE — ED Notes (Signed)
Pt transported to XR.  
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
## 2013-06-20 ENCOUNTER — Encounter (HOSPITAL_COMMUNITY): Payer: Self-pay | Admitting: *Deleted

## 2013-06-20 ENCOUNTER — Emergency Department (HOSPITAL_COMMUNITY)
Admission: EM | Admit: 2013-06-20 | Discharge: 2013-06-20 | Disposition: A | Discharge status: Home / Self Care | Payer: Medicare Other | Attending: Emergency Medicine | Admitting: Emergency Medicine

## 2013-06-20 ENCOUNTER — Emergency Department (HOSPITAL_COMMUNITY): Payer: Medicare Other

## 2013-06-20 DIAGNOSIS — I10 Essential (primary) hypertension: Secondary | ICD-10-CM | POA: Insufficient documentation

## 2013-06-20 DIAGNOSIS — Z8659 Personal history of other mental and behavioral disorders: Secondary | ICD-10-CM | POA: Insufficient documentation

## 2013-06-20 DIAGNOSIS — Z87828 Personal history of other (healed) physical injury and trauma: Secondary | ICD-10-CM | POA: Insufficient documentation

## 2013-06-20 DIAGNOSIS — S139XXA Sprain of joints and ligaments of unspecified parts of neck, initial encounter: Secondary | ICD-10-CM | POA: Insufficient documentation

## 2013-06-20 DIAGNOSIS — Z87442 Personal history of urinary calculi: Secondary | ICD-10-CM | POA: Insufficient documentation

## 2013-06-20 DIAGNOSIS — S0990XD Unspecified injury of head, subsequent encounter: Secondary | ICD-10-CM

## 2013-06-20 DIAGNOSIS — Z880 Allergy status to penicillin: Secondary | ICD-10-CM | POA: Insufficient documentation

## 2013-06-20 DIAGNOSIS — Z8719 Personal history of other diseases of the digestive system: Secondary | ICD-10-CM | POA: Insufficient documentation

## 2013-06-20 DIAGNOSIS — Y92410 Unspecified street and highway as the place of occurrence of the external cause: Secondary | ICD-10-CM | POA: Insufficient documentation

## 2013-06-20 DIAGNOSIS — Z791 Long term (current) use of non-steroidal anti-inflammatories (NSAID): Secondary | ICD-10-CM | POA: Insufficient documentation

## 2013-06-20 DIAGNOSIS — S0990XA Unspecified injury of head, initial encounter: Secondary | ICD-10-CM | POA: Insufficient documentation

## 2013-06-20 DIAGNOSIS — Z8669 Personal history of other diseases of the nervous system and sense organs: Secondary | ICD-10-CM | POA: Insufficient documentation

## 2013-06-20 DIAGNOSIS — Y9389 Activity, other specified: Secondary | ICD-10-CM | POA: Insufficient documentation

## 2013-06-20 DIAGNOSIS — Z79899 Other long term (current) drug therapy: Secondary | ICD-10-CM | POA: Insufficient documentation

## 2013-06-20 DIAGNOSIS — Z87891 Personal history of nicotine dependence: Secondary | ICD-10-CM | POA: Insufficient documentation

## 2013-06-20 DIAGNOSIS — S161XXD Strain of muscle, fascia and tendon at neck level, subsequent encounter: Secondary | ICD-10-CM

## 2013-06-20 MED ORDER — HYDROCODONE-ACETAMINOPHEN 5-325 MG PO TABS: 1.0000 | ORAL_TABLET | Freq: Four times a day (QID) | ORAL | Status: DC | PRN

## 2013-06-20 MED ORDER — HYDROCODONE-ACETAMINOPHEN 5-325 MG PO TABS
1.0000 | ORAL_TABLET | Freq: Once | ORAL | Status: AC
  Administered 2013-06-20: 1 via ORAL
  Filled 2013-06-20: qty 1
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
## 2013-06-20 NOTE — ED Notes (Signed)
Patient is alert and oriented x3.  She was given DC instructions and follow up visit instructions.  Patient gave verbal understanding. She was DC ambulatory under her own power to home.  V/S stable.  He was not showing any signs of distress on DC 

## 2013-06-20 NOTE — ED Provider Notes (Signed)
  Medical screening examination/treatment/procedure(s) were performed by non-physician practitioner and as supervising physician I was immediately available for consultation/collaboration.    Gerhard Munch, MD 06/20/13 [id number]
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
## 2013-06-21 NOTE — ED Provider Notes (Signed)
Medical screening examination/treatment/procedure(s) were performed by non-physician practitioner and as supervising physician I was immediately available for consultation/collaboration.  Derwood Kaplan, MD 06/21/13 1439

## 2013-06-30 ENCOUNTER — Ambulatory Visit (INDEPENDENT_AMBULATORY_CARE_PROVIDER_SITE_OTHER): Payer: Medicare Other | Admitting: Obstetrics & Gynecology

## 2013-06-30 ENCOUNTER — Encounter: Payer: Self-pay | Admitting: Obstetrics & Gynecology

## 2013-06-30 VITALS — BP 157/99 | HR 112 | Temp 97.1°F | Ht 60.0 in | Wt 295.2 lb

## 2013-06-30 DIAGNOSIS — IMO0001 Reserved for inherently not codable concepts without codable children: Secondary | ICD-10-CM

## 2013-06-30 DIAGNOSIS — N920 Excessive and frequent menstruation with regular cycle: Secondary | ICD-10-CM

## 2013-06-30 DIAGNOSIS — Z01812 Encounter for preprocedural laboratory examination: Secondary | ICD-10-CM

## 2013-06-30 DIAGNOSIS — Z30430 Encounter for insertion of intrauterine contraceptive device: Secondary | ICD-10-CM

## 2013-06-30 LAB — POCT PREGNANCY, URINE: Preg Test, Ur: NEGATIVE

## 2013-06-30 MED ORDER — LEVONORGESTREL 20 MCG/24HR IU IUD
INTRAUTERINE_SYSTEM | Freq: Once | INTRAUTERINE | Status: AC
  Administered 2013-06-30: 1 via INTRAUTERINE

## 2013-06-30 NOTE — Progress Notes (Signed)
Patient ID: Melinda Ruiz, female   DOB: 10-07-1981, 32 y.o.   MRN: [id number] Returns for Mirena insertion after reviewing information. She was counseled and the risks were discussed and her questions were answered.  Patient identified, informed consent performed, signed copy in chart, time out was performed.  Urine pregnancy test negative.  Speculum placed in the vagina.  Cervix visualized.  Cleaned with Betadine x 2.  Grasped anteriourly with a single tooth tenaculum.  Uterus sounded to 9 cm.  Mirena IUD placed per manufacturer's recommendations.  Strings trimmed to 3 cm.   Patient given post procedure instructions and Mirena care card with expiration date.  Patient is asked to check IUD strings periodically and follow up in 4-6 weeks for IUD check.  Adam Phenix, MD 06/30/2013 4:26 PM

## 2013-06-30 NOTE — Patient Instructions (Signed)
Levonorgestrel intrauterine device (IUD) What is this medicine? LEVONORGESTREL IUD (LEE voe nor jes trel) is a contraceptive (birth control) device. The device is placed inside the uterus by a healthcare professional. It is used to prevent pregnancy and can also be used to treat heavy bleeding that occurs during your period. Depending on the device, it can be used for 3 to 5 years. This medicine may be used for other purposes; ask your health care provider or pharmacist if you have questions. What should I tell my health care provider before I take this medicine? They need to know if you have any of these conditions: -abnormal Pap smear -cancer of the breast, uterus, or cervix -diabetes -endometritis -genital or pelvic infection now or in the past -have more than one sexual partner or your partner has more than one partner -heart disease -history of an ectopic or tubal pregnancy -immune system problems -IUD in place -liver disease or tumor -problems with blood clots or take blood-thinners -use intravenous drugs -uterus of unusual shape -vaginal bleeding that has not been explained -an unusual or allergic reaction to levonorgestrel, other hormones, silicone, or polyethylene, medicines, foods, dyes, or preservatives -pregnant or trying to get pregnant -breast-feeding How should I use this medicine? This device is placed inside the uterus by a health care professional. Talk to your pediatrician regarding the use of this medicine in children. Special care may be needed. Overdosage: If you think you have taken too much of this medicine contact a poison control center or emergency room at once. NOTE: This medicine is only for you. Do not share this medicine with others. What if I miss a dose? This does not apply. What may interact with this medicine? Do not take this medicine with any of the following medications: -amprenavir -bosentan -fosamprenavir This medicine may also interact with  the following medications: -aprepitant -barbiturate medicines for inducing sleep or treating seizures -bexarotene -griseofulvin -medicines to treat seizures like carbamazepine, ethotoin, felbamate, oxcarbazepine, phenytoin, topiramate -modafinil -pioglitazone -rifabutin -rifampin -rifapentine -some medicines to treat HIV infection like atazanavir, indinavir, lopinavir, nelfinavir, tipranavir, ritonavir -St. John's wort -warfarin This list may not describe all possible interactions. Give your health care provider a list of all the medicines, herbs, non-prescription drugs, or dietary supplements you use. Also tell them if you smoke, drink alcohol, or use illegal drugs. Some items may interact with your medicine. What should I watch for while using this medicine? Visit your doctor or health care professional for regular check ups. See your doctor if you or your partner has sexual contact with others, becomes HIV positive, or gets a sexual transmitted disease. This product does not protect you against HIV infection (AIDS) or other sexually transmitted diseases. You can check the placement of the IUD yourself by reaching up to the top of your vagina with clean fingers to feel the threads. Do not pull on the threads. It is a good habit to check placement after each menstrual period. Call your doctor right away if you feel more of the IUD than just the threads or if you cannot feel the threads at all. The IUD may come out by itself. You may become pregnant if the device comes out. If you notice that the IUD has come out use a backup birth control method like condoms and call your health care provider. Using tampons will not change the position of the IUD and are okay to use during your period. What side effects may I notice from receiving this medicine?   Side effects that you should report to your doctor or health care professional as soon as possible: -allergic reactions like skin rash, itching or  hives, swelling of the face, lips, or tongue -fever, flu-like symptoms -genital sores -high blood pressure -no menstrual period for 6 weeks during use -pain, swelling, warmth in the leg -pelvic pain or tenderness -severe or sudden headache -signs of pregnancy -stomach cramping -sudden shortness of breath -trouble with balance, talking, or walking -unusual vaginal bleeding, discharge -yellowing of the eyes or skin Side effects that usually do not require medical attention (report to your doctor or health care professional if they continue or are bothersome): -acne -breast pain -change in sex drive or performance -changes in weight -cramping, dizziness, or faintness while the device is being inserted -headache -irregular menstrual bleeding within first 3 to 6 months of use -nausea This list may not describe all possible side effects. Call your doctor for medical advice about side effects. You may report side effects to FDA at 1-800-FDA-1088. Where should I keep my medicine? This does not apply. NOTE: This sheet is a summary. It may not cover all possible information. If you have questions about this medicine, talk to your doctor, pharmacist, or health care provider.  2013, [employer]vier/[employer] Standard. (01/15/2012 1:54:04 PM)  

## 2013-07-04 ENCOUNTER — Encounter: Payer: Self-pay | Admitting: *Deleted

## 2013-07-28 ENCOUNTER — Ambulatory Visit: Payer: Medicare Other | Admitting: Obstetrics & Gynecology

## 2013-08-01 ENCOUNTER — Ambulatory Visit: Payer: Medicare Other | Admitting: Obstetrics

## 2013-08-22 ENCOUNTER — Inpatient Hospital Stay (HOSPITAL_COMMUNITY)
Admission: AD | Admit: 2013-08-22 | Discharge: 2013-08-22 | Disposition: A | Discharge status: Home / Self Care | Payer: Medicare Other | Source: Ambulatory Visit | Attending: Obstetrics & Gynecology | Admitting: Obstetrics & Gynecology

## 2013-08-22 ENCOUNTER — Encounter (HOSPITAL_COMMUNITY): Payer: Self-pay | Admitting: *Deleted

## 2013-08-22 DIAGNOSIS — R109 Unspecified abdominal pain: Secondary | ICD-10-CM | POA: Insufficient documentation

## 2013-08-22 DIAGNOSIS — A499 Bacterial infection, unspecified: Secondary | ICD-10-CM

## 2013-08-22 DIAGNOSIS — B9689 Other specified bacterial agents as the cause of diseases classified elsewhere: Secondary | ICD-10-CM | POA: Insufficient documentation

## 2013-08-22 DIAGNOSIS — N760 Acute vaginitis: Secondary | ICD-10-CM | POA: Insufficient documentation

## 2013-08-22 DIAGNOSIS — Z30431 Encounter for routine checking of intrauterine contraceptive device: Secondary | ICD-10-CM | POA: Insufficient documentation

## 2013-08-22 LAB — URINALYSIS, ROUTINE W REFLEX MICROSCOPIC
Hgb urine dipstick: NEGATIVE
Protein, ur: NEGATIVE mg/dL
Urobilinogen, UA: 0.2 mg/dL (ref 0.0–1.0)

## 2013-08-22 LAB — WET PREP, GENITAL: Yeast Wet Prep HPF POC: NONE SEEN

## 2013-08-22 MED ORDER — METRONIDAZOLE 500 MG PO TABS: 500.0000 mg | ORAL_TABLET | Freq: Two times a day (BID) | ORAL | Status: DC

## 2013-08-22 NOTE — MAU Provider Note (Addendum)
Attestation of Attending Supervision of Advanced Practitioner (CNM/NP): Evaluation and management procedures were performed by the Advanced Practitioner under my supervision and collaboration.  I have reviewed the Advanced Practitioner's note and chart, and I agree with the management and plan.  HARRAWAY-SMITH, CAROLYN 10:29 PM    Pts IUD strings were seen and not removed

## 2013-08-22 NOTE — MAU Note (Addendum)
Pt states she had Mirena placed on 06-23-13 and is scheduled for follow up on 09-14-13 but is experience a lot of cramping and unable to feel the string.  Pt also wants to be checked for STD's today and C/O foul odor with brown discharge from vagina.

## 2013-08-22 NOTE — MAU Provider Note (Addendum)
History     CSN: [id number]  Arrival date and time: 08/22/13 4098   None     No chief complaint on file.  HPI Comments: Melinda Ruiz 32 y.o. [id number] who presents to MAU for Melinda Ruiz IUD check and STD screening. She has been unable to feel IUD strings and she caught her partner with another woman. She asked if IUD could be removed as it is causing her to have some cramping. She admits it is helping her painful menses.        Past Medical History  Diagnosis Date  . Complication of anesthesia     "Oxygen level drops"  . Kidney stones 2010  . Constipation   . Anemia     during pregancy  . 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  Has been [facility]  . Depression   . Hypertension     takes meds daily  . GNFAOZHY(865.7)     Past Surgical History  Procedure Laterality Date  . Cesarean section  2006, 2011  . Cystectomy    . Lympectomy    . Lymp glands removed--under arm bil    . Cholecystectomy  06/2011  . Tubal ligation  2011  . Multiple tooth extractions  2003    and wisdom teeth  . Pilonidial cyst removed  1990's  . Carpal tunnel release  02/03/2012    Procedure: CARPAL TUNNEL RELEASE;  Surgeon: Cammy Copa, MD;  Location: [facility] OR;  Service: Orthopedics;  Laterality: Left;  left carpal tunnel release  . Ganglion cyst excision  02/03/2012    Procedure: REMOVAL GANGLION OF WRIST;  Surgeon: Cammy Copa, MD;  Location: [facility] OR;  Service: Orthopedics;  Laterality: Left;  left dorsal ganglion cyst excision  . Carpal tunnel release  01/11/2013    Procedure: CARPAL TUNNEL RELEASE;  Surgeon: Cammy Copa, MD;  Location: [facility] OR;  Service: Orthopedics;  Laterality: Right;  Hand Set, Small Retractors, Lead Hand    Family History  Problem Relation Age of Onset  . Kidney cancer Father   . Anesthesia problems Neg Hx     History  Substance Use Topics  . Smoking status: Current Some Day Smoker -- 0.25 packs/day    Types:  Cigarettes    Last Attempt to Quit: 08/02/2011  . Smokeless tobacco: Never Used  . Alcohol Use: Yes     Comment: occasionally    Allergies:  Allergies  Allergen Reactions  . Penicillins Hives    Prescriptions prior to admission  Medication Sig Dispense Refill  . levonorgestrel (MIRENA) 20 MCG/24HR IUD 1 each by Intrauterine route once. Was placed 07/23/13.        Review of Systems  Constitutional: Negative.   HENT: Negative.   Eyes: Negative.   Respiratory: Negative.   Cardiovascular: Negative.   Gastrointestinal: Negative.   Genitourinary: Negative for dysuria, urgency, frequency and flank pain.       Complains of pink, brown discharge  Musculoskeletal: Negative.   Skin: Negative.   Neurological: Negative.   Endo/Heme/Allergies: Negative.   Psychiatric/Behavioral: Negative.    Physical Exam   Blood pressure 132/78, pulse 93, temperature 97.6 F (36.4 C), temperature source Oral, resp. rate 18, height 5' (1.524 m), weight 135.172 kg (298 lb), last menstrual period 07/07/2013, SpO2 100.00%.  Physical Exam  Constitutional: She is oriented to person, place, and time. She appears well-developed and well-nourished.  HENT:  Head: Normocephalic and atraumatic.  Eyes: Pupils  are equal, round, and reactive to light.  Cardiovascular: Normal rate.   Respiratory: Effort normal.  GI: Soft. She exhibits distension. She exhibits no mass. There is no tenderness. There is no rebound and no guarding.  Genitourinary: Uterus normal. Vaginal discharge found.  Pink discharge with odor. IUD strings were seen  Musculoskeletal: Normal range of motion.  Neurological: She is alert and oriented to person, place, and time.  Skin: Skin is warm.  Psychiatric: She has a normal mood and affect.   Results for orders placed during the hospital encounter of 08/22/13 (from the past 24 hour(s))  URINALYSIS, ROUTINE W REFLEX MICROSCOPIC     Status: None   Collection Time    08/22/13 11:26 AM       Result Value Range   Color, Urine YELLOW  YELLOW   APPearance CLEAR  CLEAR   Specific Gravity, Urine 1.025  1.005 - 1.030   pH 6.0  5.0 - 8.0   Glucose, UA NEGATIVE  NEGATIVE mg/dL   Hgb urine dipstick NEGATIVE  NEGATIVE   Bilirubin Urine NEGATIVE  NEGATIVE   Ketones, ur NEGATIVE  NEGATIVE mg/dL   Protein, ur NEGATIVE  NEGATIVE mg/dL   Urobilinogen, UA 0.2  0.0 - 1.0 mg/dL   Nitrite NEGATIVE  NEGATIVE   Leukocytes, UA NEGATIVE  NEGATIVE  WET PREP, GENITAL     Status: Abnormal   Collection Time    08/22/13 11:50 AM      Result Value Range   Yeast Wet Prep HPF POC NONE SEEN  NONE SEEN   Trich, Wet Prep NONE SEEN  NONE SEEN   Clue Cells Wet Prep HPF POC MANY (*) NONE SEEN   WBC, Wet Prep HPF POC FEW (*) NONE SEEN    MAU Course  Procedures  MDM GC/ wet prep/ UA  Assessment and Plan  A: Bacterial Vaginosis P: Flagyl 50 0mg  po BID x 7 days No Alcohol/ no intercourse for 7 days F/U in clinic/ has appt on 09/14/13  Carolynn Serve 08/22/2013, 11:35 AM

## 2013-08-23 LAB — GC/CHLAMYDIA PROBE AMP
CT Probe RNA: NEGATIVE
GC Probe RNA: NEGATIVE

## 2013-08-24 NOTE — MAU Provider Note (Signed)
Attestation of Attending Supervision of Advanced Practitioner (CNM/NP): Evaluation and management procedures were performed by the Advanced Practitioner under my supervision and collaboration.  I have reviewed the Advanced Practitioner's note and chart, and I agree with the management and plan.  HARRAWAY-SMITH, Othniel Maret 10:36 AM

## 2013-08-24 NOTE — MAU Provider Note (Signed)
Attestation of Attending Supervision of Advanced Practitioner (CNM/NP): Evaluation and management procedures were performed by the Advanced Practitioner under my supervision and collaboration.  I have reviewed the Advanced Practitioner's note and chart, and I agree with the management and plan.  HARRAWAY-SMITH, Keaghan Staton 10:39 AM

## 2013-09-14 ENCOUNTER — Encounter: Payer: Medicare Other | Admitting: Obstetrics and Gynecology

## 2013-09-26 NOTE — Progress Notes (Signed)
This encounter was created in error - please disregard.

## 2013-09-28 ENCOUNTER — Ambulatory Visit: Payer: Medicare Other | Admitting: Nurse Practitioner

## 2013-11-03 ENCOUNTER — Ambulatory Visit: Payer: Medicare Other | Admitting: Family Medicine

## 2013-12-23 ENCOUNTER — Inpatient Hospital Stay (HOSPITAL_COMMUNITY)
Admission: AD | Admit: 2013-12-23 | Discharge: 2013-12-23 | Disposition: A | Discharge status: Home / Self Care | Payer: Medicare Other | Source: Ambulatory Visit | Attending: Obstetrics & Gynecology | Admitting: Obstetrics & Gynecology

## 2013-12-23 ENCOUNTER — Encounter (HOSPITAL_COMMUNITY): Payer: Self-pay | Admitting: *Deleted

## 2013-12-23 DIAGNOSIS — R1032 Left lower quadrant pain: Secondary | ICD-10-CM

## 2013-12-23 DIAGNOSIS — Z3202 Encounter for pregnancy test, result negative: Secondary | ICD-10-CM | POA: Insufficient documentation

## 2013-12-23 DIAGNOSIS — Z30431 Encounter for routine checking of intrauterine contraceptive device: Secondary | ICD-10-CM | POA: Insufficient documentation

## 2013-12-23 DIAGNOSIS — M545 Low back pain, unspecified: Secondary | ICD-10-CM | POA: Insufficient documentation

## 2013-12-23 DIAGNOSIS — F17200 Nicotine dependence, unspecified, uncomplicated: Secondary | ICD-10-CM | POA: Insufficient documentation

## 2013-12-23 LAB — URINALYSIS, ROUTINE W REFLEX MICROSCOPIC
Hgb urine dipstick: NEGATIVE
Nitrite: NEGATIVE
Specific Gravity, Urine: 1.025 (ref 1.005–1.030)
Urobilinogen, UA: 0.2 mg/dL (ref 0.0–1.0)

## 2013-12-23 LAB — WET PREP, GENITAL: Yeast Wet Prep HPF POC: NONE SEEN

## 2013-12-23 LAB — POCT PREGNANCY, URINE: Preg Test, Ur: NEGATIVE

## 2013-12-23 NOTE — MAU Provider Note (Signed)
Attestation of Attending Supervision of Advanced Practitioner (CNM/NP): Evaluation and management procedures were performed by the Advanced Practitioner under my supervision and collaboration.  I have reviewed the Advanced Practitioner's note and chart, and I agree with the management and plan.  HARRAWAY-SMITH, Tekoa Hamor 4:59 PM     

## 2013-12-23 NOTE — MAU Provider Note (Signed)
History     CSN: [id number]  Arrival date and time: 12/23/13 1139   First Provider Initiated Contact with Patient 12/23/13 1207      Chief Complaint  Patient presents with  . Abdominal Pain  . Back Pain   HPI [name] is a 32 y.o. [id number] who presents to MAU today with LLQ pain x 1 week. The patient has a Mirena IUD that was placed in July for DUB. She has also had a BTL. The patient states that she had intercourse yesterday with a "very well-endowed" man and is concerned that he may have "knocked her IUD up further" and that is why she is having pain. She states pain is in the LLQ and low back. She is having scant brown spotting and denies other discharge or bleeding. She is also concerned because she can't feel her IUD string today. She denies fever or UTI symptoms. She did have one episode of loose stool this morning. She requests having her IUD removed and using Depo provera instead. Although, patient agrees that the IUD has been working well for her. She states that she has spotting x 1-1/12 weeks per month, without any heavy bleeding.   OB History   Grav Para Term Preterm Abortions TAB SAB Ect Mult Living   3 2 2  0 1 0 1 0 0 2      Past Medical History  Diagnosis Date  . Complication of anesthesia     "Oxygen level drops"  . Kidney stones 2010  . Constipation   . Anemia     during pregancy  . 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  Has been seen at [facility]  . Depression   . Hypertension     takes meds daily  . ZHYQMVHQ(469.6)     Past Surgical History  Procedure Laterality Date  . Cesarean section  2006, 2011  . Cystectomy    . Lympectomy    . Lymp glands removed--under arm bil    . Cholecystectomy  06/2011  . Tubal ligation  2011  . Multiple tooth extractions  2003    and wisdom teeth  . Pilonidial cyst removed  1990's  . Carpal tunnel release  02/03/2012    Procedure: CARPAL TUNNEL RELEASE;  Surgeon: Cammy Copa, MD;  Location: [facility] OR;  Service: Orthopedics;  Laterality: Left;  left carpal tunnel release  . Ganglion cyst excision  02/03/2012    Procedure: REMOVAL GANGLION OF WRIST;  Surgeon: Cammy Copa, MD;  Location: [facility] OR;  Service: Orthopedics;  Laterality: Left;  left dorsal ganglion cyst excision  . Carpal tunnel release  01/11/2013    Procedure: CARPAL TUNNEL RELEASE;  Surgeon: Cammy Copa, MD;  Location: [facility] OR;  Service: Orthopedics;  Laterality: Right;  Hand Set, Small Retractors, Lead Hand    Family History  Problem Relation Age of Onset  . Kidney cancer Father   . Anesthesia problems Neg Hx     History  Substance Use Topics  . Smoking status: Current Some Day Smoker -- 0.25 packs/day    Types: Cigarettes    Last Attempt to Quit: 08/02/2011  . Smokeless tobacco: Never Used  . Alcohol Use: Yes     Comment: occasionally    Allergies:  Allergies  Allergen Reactions  . Penicillins Hives    Prescriptions prior to admission  Medication Sig Dispense Refill  . acetaminophen (TYLENOL) 500 MG tablet Take 500 mg  by mouth every 6 (six) hours as needed.      Marland Kitchen levonorgestrel (MIRENA) 20 MCG/24HR IUD 1 each by Intrauterine route once. Was placed 07/23/13.        Review of Systems  Constitutional: Negative for fever and malaise/fatigue.  Gastrointestinal: Positive for abdominal pain and diarrhea. Negative for constipation.  Genitourinary: Negative for dysuria, urgency and frequency.       Neg - vaginal discharge + vaginal bleeding  Musculoskeletal: Positive for back pain.   Physical Exam   Blood pressure 129/75, pulse 104, temperature 98 F (36.7 C), temperature source Oral, resp. rate 20, height 5\' 1"  (1.549 m), weight 293 lb 4 oz (133.017 kg), last menstrual period 11/29/2013.  Physical Exam  Constitutional: She is oriented to person, place, and time. She appears well-developed and well-nourished. No distress.  Patient appears very comfortable laying in bed using  her cell phone  HENT:  Head: Normocephalic and atraumatic.  Cardiovascular: Normal rate.   Respiratory: Effort normal.  GI: Soft. Bowel sounds are normal. She exhibits no distension and no mass. There is tenderness (mild tenderness in the LLQ). There is no rebound and no guarding.  Genitourinary: Uterus is not enlarged and not tender. Cervix exhibits no motion tenderness, no discharge and no friability. Right adnexum displays no mass and no tenderness. Left adnexum displays no mass and no tenderness. There is bleeding (scant dark blood noted) around the vagina. No vaginal discharge found.    Neurological: She is alert and oriented to person, place, and time.  Skin: Skin is warm and dry. No erythema.  Psychiatric: She has a normal mood and affect.   Results for orders placed during the hospital encounter of 12/23/13 (from the past 24 hour(s))  URINALYSIS, ROUTINE W REFLEX MICROSCOPIC     Status: None   Collection Time    12/23/13 11:48 AM      Result Value Range   Color, Urine YELLOW  YELLOW   APPearance CLEAR  CLEAR   Specific Gravity, Urine 1.025  1.005 - 1.030   pH 6.0  5.0 - 8.0   Glucose, UA NEGATIVE  NEGATIVE mg/dL   Hgb urine dipstick NEGATIVE  NEGATIVE   Bilirubin Urine NEGATIVE  NEGATIVE   Ketones, ur NEGATIVE  NEGATIVE mg/dL   Protein, ur NEGATIVE  NEGATIVE mg/dL   Urobilinogen, UA 0.2  0.0 - 1.0 mg/dL   Nitrite NEGATIVE  NEGATIVE   Leukocytes, UA NEGATIVE  NEGATIVE  POCT PREGNANCY, URINE     Status: None   Collection Time    12/23/13 12:12 PM      Result Value Range   Preg Test, Ur NEGATIVE  NEGATIVE  WET PREP, GENITAL     Status: Abnormal   Collection Time    12/23/13 12:18 PM      Result Value Range   Yeast Wet Prep HPF POC NONE SEEN  NONE SEEN   Trich, Wet Prep NONE SEEN  NONE SEEN   Clue Cells Wet Prep HPF POC NONE SEEN  NONE SEEN   WBC, Wet Prep HPF POC FEW (*) NONE SEEN    MAU Course  Procedures None  MDM UPT - negative UA, Wet prep, GC/Chlamydia  today  Assessment and Plan  A: LLQ abdominal pain, most likely GI origin  P: Discharge home Advised Ibuprofen or Tylenol PRN for pain Patient encouraged to call [facility] for an appointment to discuss a change in management of her DUB Patient may return to MAU as needed or if  her condition were to change or worsen  Freddi Starr, PA-C  12/23/2013, 12:41 PM

## 2013-12-23 NOTE — MAU Note (Signed)
Patient presents with complaints of lower left abdominal pain and lower back pain following intercourse a week ago.

## 2014-01-13 ENCOUNTER — Encounter: Payer: Self-pay | Admitting: Family Medicine

## 2014-01-13 ENCOUNTER — Ambulatory Visit (INDEPENDENT_AMBULATORY_CARE_PROVIDER_SITE_OTHER): Payer: Medicare Other | Admitting: Family Medicine

## 2014-01-13 VITALS — BP 143/92 | HR 80 | Temp 97.3°F | Ht 60.0 in | Wt 289.7 lb

## 2014-01-13 DIAGNOSIS — N920 Excessive and frequent menstruation with regular cycle: Secondary | ICD-10-CM

## 2014-01-13 DIAGNOSIS — R300 Dysuria: Secondary | ICD-10-CM

## 2014-01-13 LAB — POCT URINALYSIS DIP (DEVICE)
Bilirubin Urine: NEGATIVE
GLUCOSE, UA: NEGATIVE mg/dL
Hgb urine dipstick: NEGATIVE
Ketones, ur: NEGATIVE mg/dL
LEUKOCYTES UA: NEGATIVE
NITRITE: NEGATIVE
PROTEIN: NEGATIVE mg/dL
Specific Gravity, Urine: 1.02 (ref 1.005–1.030)
UROBILINOGEN UA: 1 mg/dL (ref 0.0–1.0)
pH: 7 (ref 5.0–8.0)

## 2014-01-13 MED ORDER — MEDROXYPROGESTERONE ACETATE 104 MG/0.65ML ~~LOC~~ SUSP
104.0000 mg | Freq: Once | SUBCUTANEOUS | Status: AC
  Administered 2014-01-13: 104 mg via SUBCUTANEOUS

## 2014-01-13 NOTE — Progress Notes (Signed)
Subjective:     Patient ID: Melinda Ruiz, female   DOB: 06/06/1981, 33 y.o.   MRN: [id number]  HPI Patient is a 33 yo F who presents to have her IUD removed. She states since having this placed she has had 2 BV infections that have been very uncomfortable. She does note that her bleeding is improved, but states it lasts longer than previously stating she has some pink and brown discharge for the better part of the month. She notes some cramping as well. Endorses discomfort with the IUD strings irritating her vagina as well. Previously she was having a period once a month lasting for 3-4 days and changing her tampon and 2 pads every hour. She notes she has had a tubal ligation.  Patient also notes she is on phentermine for weight loss for the past year. She notes having HTN. Her PCP started her on the phentermine and patient states she has not lost much weight on this.  Melinda Ruiz is a 33 y.o. [phone] here for Mirena IUD removal.  Last pap smear was on 06/08/13 and was normal.  IUD Removal  Patient was in the dorsal lithotomy position, normal external genitalia was noted.  A speculum was placed in the patient's vagina, normal discharge was noted, no lesions. The multiparous cervix was visualized, no lesions, no abnormal discharge.  The strings of the IUD were grasped and pulled using ring forceps.   Patient tolerated the procedure well.    Review of Systems see HPI     Objective:   Physical Exam  Constitutional: She appears well-developed and well-nourished. No distress.  Genitourinary: Vagina normal.  Normal external vagina, normal vaginal mucosa, IUD strings visualized prior to removal, no lesions or discharge noted  BP 143/92  Pulse 80  Temp(Src) 97.3 F (36.3 C)  Ht 5' (1.524 m)  Wt 289 lb 11.2 oz (131.407 kg)  BMI 56.58 kg/m2  LMP 11/29/2013     Assessment:     IUD removal  Dysfunctional Uterine Bleeding    Plan:     IUD removed today. Patient started on  depo-provera for management of DUB. She is to return in 3 months for next depo shot. Discussed the need for the patient to discuss her phentermine with her PCP especially given her history of HTN.     Tommi Rumps, MD [place] PGY-2  I have seen and examined this patient and agree with above documentation in the resident's note. Discussed at long lengths that the pt's symptoms are unlikely due to the IUD. Pt states she "does not care" and will "hold her peace" but wants it out. As such, we performed the removal as above.   Ebbie Latus, M.D. [facility] Fellow 01/13/2014 11:43 AM

## 2014-01-13 NOTE — Progress Notes (Signed)
Melinda Ruiz here to get IUD taken out,was placed 06/2013.Melinda Ruiz is having pelvic pain, and has had BV 2 times since IUd placed. Melinda Ruiz has missed bp meds last 2 days, but will restart. We discussed importance of taking bp meds.  Also c/o pain with urination, hematuria, and cloudy urine- will do urinalysis

## 2014-01-13 NOTE — Patient Instructions (Signed)

## 2014-01-18 ENCOUNTER — Encounter: Payer: Self-pay | Admitting: *Deleted

## 2014-02-25 ENCOUNTER — Encounter (HOSPITAL_COMMUNITY): Payer: Self-pay | Admitting: Emergency Medicine

## 2014-02-25 ENCOUNTER — Emergency Department (HOSPITAL_COMMUNITY)
Admission: EM | Admit: 2014-02-25 | Discharge: 2014-02-25 | Disposition: A | Discharge status: Home / Self Care | Payer: Medicare Other | Attending: Emergency Medicine | Admitting: Emergency Medicine

## 2014-02-25 DIAGNOSIS — Z87442 Personal history of urinary calculi: Secondary | ICD-10-CM | POA: Insufficient documentation

## 2014-02-25 DIAGNOSIS — Z862 Personal history of diseases of the blood and blood-forming organs and certain disorders involving the immune mechanism: Secondary | ICD-10-CM | POA: Insufficient documentation

## 2014-02-25 DIAGNOSIS — Z87891 Personal history of nicotine dependence: Secondary | ICD-10-CM | POA: Insufficient documentation

## 2014-02-25 DIAGNOSIS — N949 Unspecified condition associated with female genital organs and menstrual cycle: Secondary | ICD-10-CM | POA: Insufficient documentation

## 2014-02-25 DIAGNOSIS — R109 Unspecified abdominal pain: Secondary | ICD-10-CM | POA: Insufficient documentation

## 2014-02-25 DIAGNOSIS — E669 Obesity, unspecified: Secondary | ICD-10-CM | POA: Insufficient documentation

## 2014-02-25 DIAGNOSIS — N939 Abnormal uterine and vaginal bleeding, unspecified: Secondary | ICD-10-CM

## 2014-02-25 DIAGNOSIS — N938 Other specified abnormal uterine and vaginal bleeding: Secondary | ICD-10-CM | POA: Insufficient documentation

## 2014-02-25 DIAGNOSIS — Z79899 Other long term (current) drug therapy: Secondary | ICD-10-CM | POA: Insufficient documentation

## 2014-02-25 DIAGNOSIS — N925 Other specified irregular menstruation: Secondary | ICD-10-CM | POA: Insufficient documentation

## 2014-02-25 DIAGNOSIS — Z9089 Acquired absence of other organs: Secondary | ICD-10-CM | POA: Insufficient documentation

## 2014-02-25 DIAGNOSIS — I10 Essential (primary) hypertension: Secondary | ICD-10-CM | POA: Insufficient documentation

## 2014-02-25 DIAGNOSIS — Z9851 Tubal ligation status: Secondary | ICD-10-CM | POA: Insufficient documentation

## 2014-02-25 DIAGNOSIS — Z8659 Personal history of other mental and behavioral disorders: Secondary | ICD-10-CM | POA: Insufficient documentation

## 2014-02-25 DIAGNOSIS — Z880 Allergy status to penicillin: Secondary | ICD-10-CM | POA: Insufficient documentation

## 2014-02-25 LAB — URINALYSIS, ROUTINE W REFLEX MICROSCOPIC
Bilirubin Urine: NEGATIVE
Glucose, UA: NEGATIVE mg/dL
Ketones, ur: NEGATIVE mg/dL
NITRITE: NEGATIVE
Protein, ur: NEGATIVE mg/dL
SPECIFIC GRAVITY, URINE: 1.026 (ref 1.005–1.030)
Urobilinogen, UA: 1 mg/dL (ref 0.0–1.0)
pH: 7.5 (ref 5.0–8.0)

## 2014-02-25 LAB — PREGNANCY, URINE: Preg Test, Ur: NEGATIVE

## 2014-02-25 LAB — CBC
HCT: 38.8 % (ref 36.0–46.0)
Hemoglobin: 13.3 g/dL (ref 12.0–15.0)
MCH: 29.9 pg (ref 26.0–34.0)
MCHC: 34.3 g/dL (ref 30.0–36.0)
MCV: 87.2 fL (ref 78.0–100.0)
PLATELETS: 372 10*3/uL (ref 150–400)
RBC: 4.45 MIL/uL (ref 3.87–5.11)
RDW: 14.3 % (ref 11.5–15.5)
WBC: 12.1 10*3/uL — AB (ref 4.0–10.5)

## 2014-02-25 LAB — WET PREP, GENITAL
Clue Cells Wet Prep HPF POC: NONE SEEN
TRICH WET PREP: NONE SEEN
YEAST WET PREP: NONE SEEN

## 2014-02-25 LAB — URINE MICROSCOPIC-ADD ON

## 2014-02-25 MED ORDER — ACETAMINOPHEN 325 MG PO TABS
650.0000 mg | ORAL_TABLET | Freq: Once | ORAL | Status: AC
  Administered 2014-02-25: 650 mg via ORAL
  Filled 2014-02-25: qty 2

## 2014-02-25 MED ORDER — IBUPROFEN 200 MG PO TABS
600.0000 mg | ORAL_TABLET | Freq: Once | ORAL | Status: AC
  Administered 2014-02-25: 600 mg via ORAL
  Filled 2014-02-25: qty 3

## 2014-02-25 MED ORDER — HYDROCODONE-ACETAMINOPHEN 5-325 MG PO TABS: 1.0000 | ORAL_TABLET | ORAL | Status: DC | PRN

## 2014-02-25 NOTE — Discharge Instructions (Signed)

## 2014-02-25 NOTE — ED Notes (Signed)
Md Campos at bedside.  

## 2014-02-25 NOTE — ED Provider Notes (Signed)
CSN: [id number]     Arrival date & time 02/25/14  1152 History   First MD Initiated Contact with Patient 02/25/14 1228     Chief Complaint  Patient presents with  . Vaginal Bleeding  . Back Pain     (Consider location/radiation/quality/duration/timing/severity/associated sxs/prior Treatment) HPI Patient reports abnormal vaginal bleeding and lower abdominal cramping today.  She states her vaginal bleeding is been associated with formation.  She is not on anticoagulants.  She does have a history uterine fibroids.  She is currently not on birth control.  She states her last normal menstrual period was 2 weeks ago.  She is on iron supplementation.  She denies nausea vomiting.  No upper abdominal pain.  No urinary complaints.  No vaginal discharge or pain with intercourse.   Past Medical History  Diagnosis Date  . Complication of anesthesia     "Oxygen level drops"  . Kidney stones 2010  . Constipation   . Anemia     during pregancy  . 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  Has been seen at [facility]  . Depression   . Hypertension     takes meds daily  . DGLOVFIE(332.9)    Past Surgical History  Procedure Laterality Date  . Cesarean section  2006, 2011  . Cystectomy    . Lympectomy    . Lymp glands removed--under arm bil    . Cholecystectomy  06/2011  . Tubal ligation  2011  . Multiple tooth extractions  2003    and wisdom teeth  . Pilonidial cyst removed  1990's  . Carpal tunnel release  02/03/2012    Procedure: CARPAL TUNNEL RELEASE;  Surgeon: Meredith Pel, MD;  Location: [place];  Service: Orthopedics;  Laterality: Left;  left carpal tunnel release  . Ganglion cyst excision  02/03/2012    Procedure: REMOVAL GANGLION OF WRIST;  Surgeon: Meredith Pel, MD;  Location: [place];  Service: Orthopedics;  Laterality: Left;  left dorsal ganglion cyst excision  . Carpal tunnel release  01/11/2013    Procedure: CARPAL TUNNEL RELEASE;  Surgeon: Meredith Pel, MD;  Location: [place];  Service: Orthopedics;  Laterality: Right;  Hand Set, Small Retractors, Lead Hand   Family History  Problem Relation Age of Onset  . Kidney cancer Father   . Anesthesia problems Neg Hx    History  Substance Use Topics  . Smoking status: Former Smoker -- 0.25 packs/day    Types: Cigarettes    Quit date: 10/01/2009  . Smokeless tobacco: Never Used  . Alcohol Use: Yes     Comment: occasionally   OB History   Grav Para Term Preterm Abortions TAB SAB Ect Mult Living   3 2 2  0 1 0 1 0 0 2     Review of Systems  All other systems reviewed and are negative.      Allergies  Penicillins  Home Medications   Current Outpatient Rx  Name  Route  Sig  Dispense  Refill  . acetaminophen (TYLENOL) 500 MG tablet   Oral   Take 500 mg by mouth every 6 (six) hours as needed for moderate pain or headache.          . hydrochlorothiazide (HYDRODIURIL) 12.5 MG tablet   Oral   Take 12.5 mg by mouth daily.         Marland Kitchen HYDROcodone-acetaminophen (NORCO/VICODIN) 5-325 MG per tablet   Oral   Take 1  tablet by mouth every 4 (four) hours as needed for moderate pain.   15 tablet   0    BP 123/79  Pulse 91  Temp(Src) 98.2 F (36.8 C) (Oral)  Resp 18  SpO2 99%  LMP 02/24/2014 Physical Exam  Nursing note and vitals reviewed. Constitutional: She is oriented to person, place, and time. She appears well-developed and well-nourished. No distress.  HENT:  Head: Normocephalic and atraumatic.  Eyes: EOM are normal.  Neck: Normal range of motion.  Cardiovascular: Normal rate, regular rhythm and normal heart sounds.   Pulmonary/Chest: Effort normal and breath sounds normal.  Abdominal: Soft. She exhibits no distension. There is no tenderness.  Obese  Genitourinary:  Normal external genitalia.  No adnexal masses or fullness.  Normal appearing cervix.  Small amount of blood from the cervix itself.  No clots in the vaginal vault.  Musculoskeletal: Normal range  of motion.  Neurological: She is alert and oriented to person, place, and time.  Skin: Skin is warm and dry.  Psychiatric: She has a normal mood and affect. Judgment normal.    ED Course  Procedures (including critical care time) Labs Review Labs Reviewed  WET PREP, GENITAL - Abnormal; Notable for the following:    WBC, Wet Prep HPF POC FEW (*)    All other components within normal limits  CBC - Abnormal; Notable for the following:    WBC 12.1 (*)    All other components within normal limits  URINALYSIS, ROUTINE W REFLEX MICROSCOPIC - Abnormal; Notable for the following:    Hgb urine dipstick MODERATE (*)    Leukocytes, UA SMALL (*)    All other components within normal limits  GC/CHLAMYDIA PROBE AMP  PREGNANCY, URINE  URINE MICROSCOPIC-ADD ON   Imaging Review No results found.   EKG Interpretation None      MDM   Final diagnoses:  Vaginal bleeding    Dysfunctional uterine bleeding.  May need outpatient ultrasound to reevaluate fibroids.  RA and iron supplementation.  Vital signs normal.  Hemoglobin normal.  Discharge home.  She understands return to the ER for new or worsening symptoms.  She reports her bleeding seems to be improving at this time.  She's had no vaginal bleeding while in the ER.    Hoy Morn, MD 02/25/14 1539

## 2014-02-25 NOTE — ED Notes (Signed)
Reports having lower back pain and lower abd cramping, having irregular vaginal bleeding with heavy clots today.

## 2014-02-27 LAB — GC/CHLAMYDIA PROBE AMP
CT Probe RNA: NEGATIVE
GC Probe RNA: NEGATIVE

## 2014-02-28 ENCOUNTER — Inpatient Hospital Stay (HOSPITAL_COMMUNITY)
Admission: AD | Admit: 2014-02-28 | Discharge: 2014-02-28 | Disposition: A | Discharge status: Home / Self Care | Payer: Medicare Other | Source: Ambulatory Visit | Attending: Obstetrics and Gynecology | Admitting: Obstetrics and Gynecology

## 2014-02-28 ENCOUNTER — Inpatient Hospital Stay (HOSPITAL_COMMUNITY): Payer: Medicare Other

## 2014-02-28 ENCOUNTER — Encounter (HOSPITAL_COMMUNITY): Payer: Self-pay | Admitting: *Deleted

## 2014-02-28 DIAGNOSIS — N938 Other specified abnormal uterine and vaginal bleeding: Secondary | ICD-10-CM | POA: Insufficient documentation

## 2014-02-28 DIAGNOSIS — M545 Low back pain, unspecified: Secondary | ICD-10-CM | POA: Insufficient documentation

## 2014-02-28 DIAGNOSIS — N921 Excessive and frequent menstruation with irregular cycle: Secondary | ICD-10-CM

## 2014-02-28 DIAGNOSIS — F3289 Other specified depressive episodes: Secondary | ICD-10-CM | POA: Insufficient documentation

## 2014-02-28 DIAGNOSIS — I10 Essential (primary) hypertension: Secondary | ICD-10-CM | POA: Insufficient documentation

## 2014-02-28 DIAGNOSIS — N925 Other specified irregular menstruation: Secondary | ICD-10-CM | POA: Insufficient documentation

## 2014-02-28 DIAGNOSIS — N949 Unspecified condition associated with female genital organs and menstrual cycle: Secondary | ICD-10-CM | POA: Insufficient documentation

## 2014-02-28 DIAGNOSIS — F329 Major depressive disorder, single episode, unspecified: Secondary | ICD-10-CM | POA: Insufficient documentation

## 2014-02-28 DIAGNOSIS — R109 Unspecified abdominal pain: Secondary | ICD-10-CM | POA: Insufficient documentation

## 2014-02-28 DIAGNOSIS — Z87891 Personal history of nicotine dependence: Secondary | ICD-10-CM | POA: Insufficient documentation

## 2014-02-28 HISTORY — DX: Benign neoplasm of connective and other soft tissue, unspecified: D21.9

## 2014-02-28 LAB — CBC
HEMATOCRIT: 38.4 % (ref 36.0–46.0)
HEMOGLOBIN: 12.9 g/dL (ref 12.0–15.0)
MCH: 28.9 pg (ref 26.0–34.0)
MCHC: 33.6 g/dL (ref 30.0–36.0)
MCV: 86.1 fL (ref 78.0–100.0)
PLATELETS: 355 10*3/uL (ref 150–400)
RBC: 4.46 MIL/uL (ref 3.87–5.11)
RDW: 14.5 % (ref 11.5–15.5)
WBC: 11.7 10*3/uL — AB (ref 4.0–10.5)

## 2014-02-28 MED ORDER — DIPHENHYDRAMINE HCL 25 MG PO CAPS
25.0000 mg | ORAL_CAPSULE | Freq: Once | ORAL | Status: AC
  Administered 2014-02-28: 25 mg via ORAL
  Filled 2014-02-28: qty 1

## 2014-02-28 MED ORDER — OXYCODONE-ACETAMINOPHEN 5-325 MG PO TABS
2.0000 | ORAL_TABLET | Freq: Once | ORAL | Status: AC
  Administered 2014-02-28: 2 via ORAL
  Filled 2014-02-28: qty 2

## 2014-02-28 NOTE — MAU Note (Signed)
Lower abd & back pain started 3 days ago, started bleeding again, but had period 2 weeks ago.  Seen @ [facility] 3 days ago, was told it could be her fibroids.  Has appt @ GYN clinic in April.  Could not stand the pain so came to MAU today.

## 2014-02-28 NOTE — MAU Provider Note (Signed)
History     CSN: [id number]  Arrival date and time: 02/28/14 1249   First Provider Initiated Contact with Patient 02/28/14 1502      Chief Complaint  Patient presents with  . Abdominal Pain  . Back Pain   HPI [name] is a 33 y.o. [id number] who presents to MAU today with complaint of lower abdominal and low back pain x 3 days. The patient had her IUD removed in January and was started on Depo Provera. She states that she had a normal period on 02/05/14 and then started bleeding again 3 days ago. She states that the bleeding she is having today is similar to a normal period for her. She has had some nausea without vomiting, diarrhea or constipation. She denies fever, UTI symptoms or vaginal discharge. The patient was seen at [facility] 02/25/14 when the pain started and told it was probably "her fibroids." She had a negative wet prep, GC/Chlamydia, UA and UPT that day.   OB History   Grav Para Term Preterm Abortions TAB SAB Ect Mult Living   3 2 2  0 1 0 1 0 0 2      Past Medical History  Diagnosis Date  . Complication of anesthesia     "Oxygen level drops"  . Kidney stones 2010  . Constipation   . Anemia     during pregancy  . 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  Has been seen at [facility]  . Depression   . Hypertension     takes meds daily  . Headache(784.0)   . Fibroid     Past Surgical History  Procedure Laterality Date  . Cesarean section  2006, 2011  . Cystectomy    . Lympectomy    . Lymp glands removed--under arm bil    . Cholecystectomy  06/2011  . Tubal ligation  2011  . Multiple tooth extractions  2003    and wisdom teeth  . Pilonidial cyst removed  1990's  . Carpal tunnel release  02/03/2012    Procedure: CARPAL TUNNEL RELEASE;  Surgeon: Meredith Pel, MD;  Location: [place];  Service: Orthopedics;  Laterality: Left;  left carpal tunnel release  . Ganglion cyst excision  02/03/2012    Procedure: REMOVAL GANGLION OF  WRIST;  Surgeon: Meredith Pel, MD;  Location: [place];  Service: Orthopedics;  Laterality: Left;  left dorsal ganglion cyst excision  . Carpal tunnel release  01/11/2013    Procedure: CARPAL TUNNEL RELEASE;  Surgeon: Meredith Pel, MD;  Location: [place];  Service: Orthopedics;  Laterality: Right;  Hand Set, Small Retractors, Lead Hand    Family History  Problem Relation Age of Onset  . Kidney cancer Father   . Anesthesia problems Neg Hx     History  Substance Use Topics  . Smoking status: Former Smoker -- 0.25 packs/day    Types: Cigarettes    Quit date: 10/01/2009  . Smokeless tobacco: Never Used  . Alcohol Use: Yes     Comment: occasionally    Allergies:  Allergies  Allergen Reactions  . Penicillins Hives  . Oxycodone-Acetaminophen Itching    Prescriptions prior to admission  Medication Sig Dispense Refill  . acetaminophen (TYLENOL) 500 MG tablet Take 500 mg by mouth every 6 (six) hours as needed for moderate pain or headache.       . hydrochlorothiazide (HYDRODIURIL) 12.5 MG tablet Take 12.5 mg by mouth daily.      Marland Kitchen  HYDROcodone-acetaminophen (NORCO/VICODIN) 5-325 MG per tablet Take 1 tablet by mouth every 4 (four) hours as needed for moderate pain.  15 tablet  0  . ibuprofen (ADVIL,MOTRIN) 200 MG tablet Take 400 mg by mouth every 6 (six) hours as needed for moderate pain.      . medroxyPROGESTERone (DEPO-PROVERA) 150 MG/ML injection Inject 150 mg into the muscle every 3 (three) months.        Review of Systems  Constitutional: Positive for malaise/fatigue. Negative for fever.  Gastrointestinal: Positive for nausea and abdominal pain. Negative for vomiting, diarrhea and constipation.  Genitourinary: Negative for dysuria, urgency and frequency.  Neurological: Negative for dizziness, loss of consciousness and weakness.   Physical Exam   Blood pressure 122/66, pulse 101, temperature 98.5 F (36.9 C), temperature source Oral, resp. rate 18, height 5' (1.524 m),  weight 290 lb 3.2 oz (131.634 kg), last menstrual period 02/05/2014.  Physical Exam  Constitutional: She is oriented to person, place, and time. She appears well-developed and well-nourished. No distress.  HENT:  Head: Normocephalic and atraumatic.  Cardiovascular: Normal rate, regular rhythm and normal heart sounds.   Respiratory: Effort normal and breath sounds normal. No respiratory distress.  GI: Soft. Bowel sounds are normal. She exhibits no distension and no mass. There is tenderness (mild tenderness to palpation of the suprapubic region). There is no rebound and no guarding.  Neurological: She is alert and oriented to person, place, and time.  Skin: Skin is warm and dry. No erythema.  Psychiatric: She has a normal mood and affect.   Results for orders placed during the hospital encounter of 02/28/14 (from the past 24 hour(s))  CBC     Status: Abnormal   Collection Time    02/28/14  2:11 PM      Result Value Ref Range   WBC 11.7 (*) 4.0 - 10.5 K/uL   RBC 4.46  3.87 - 5.11 MIL/uL   Hemoglobin 12.9  12.0 - 15.0 g/dL   HCT 38.4  36.0 - 46.0 %   MCV 86.1  78.0 - 100.0 fL   MCH 28.9  26.0 - 34.0 pg   MCHC 33.6  30.0 - 36.0 g/dL   RDW 14.5  11.5 - 15.5 %   Platelets 355  150 - 400 K/uL   US Transvaginal Non-ob  02/28/2014   CLINICAL DATA:  Low abdominal pain. Bleeding. History of to tubal ligation.  EXAM: TRANSABDOMINAL AND TRANSVAGINAL ULTRASOUND OF PELVIS  TECHNIQUE: Both transabdominal and transvaginal ultrasound examinations of the pelvis were performed. Transabdominal technique was performed for global imaging of the pelvis including uterus, ovaries, adnexal regions, and pelvic cul-de-sac. It was necessary to proceed with endovaginal exam following the transabdominal exam to visualize the uterus, endometrium, ovaries and adnexae.  COMPARISON:  09/11/2010 OB sonogram.  FINDINGS: Uterus  Measurements: 8.2 x 4.8 x 5.4 cm. Retroflexed without uterine mass identified.  Endometrium   Thickness: 8.4 mm.  No focal abnormality visualized.  Right ovary  Measurements: 2.9 x 2 x 2.2 cm. Dominant cyst/follicle measures up to 2.2 cm.  Left ovary  Measurements: 2.3 x 1.9 x 1.6 cm. Normal appearance/no adnexal mass.  Other findings  No free fluid.  IMPRESSION: No cause of uterine bleeding detected.  Please see above.   Electronically Signed   By: Chauncey Cruel M.D.   On: 02/28/2014 17:49   US Pelvis Complete  02/28/2014   CLINICAL DATA:  Low abdominal pain. Bleeding. History of to tubal ligation.  EXAM: TRANSABDOMINAL AND TRANSVAGINAL  ULTRASOUND OF PELVIS  TECHNIQUE: Both transabdominal and transvaginal ultrasound examinations of the pelvis were performed. Transabdominal technique was performed for global imaging of the pelvis including uterus, ovaries, adnexal regions, and pelvic cul-de-sac. It was necessary to proceed with endovaginal exam following the transabdominal exam to visualize the uterus, endometrium, ovaries and adnexae.  COMPARISON:  09/11/2010 OB sonogram.  FINDINGS: Uterus  Measurements: 8.2 x 4.8 x 5.4 cm. Retroflexed without uterine mass identified.  Endometrium  Thickness: 8.4 mm.  No focal abnormality visualized.  Right ovary  Measurements: 2.9 x 2 x 2.2 cm. Dominant cyst/follicle measures up to 2.2 cm.  Left ovary  Measurements: 2.3 x 1.9 x 1.6 cm. Normal appearance/no adnexal mass.  Other findings  No free fluid.  IMPRESSION: No cause of uterine bleeding detected.  Please see above.   Electronically Signed   By: Chauncey Cruel M.D.   On: 02/28/2014 17:49    MAU Course  Procedures None  MDM CBC today shows normal Hgb [place] today Percocet given in MAU. Patient reports itching. 25 mg Benadryl given. Patient had been taking Vicodin yesterday without issues.   Assessment and Plan  A: Breakthrough bleeding on Depo Provera  P: Discharge home Patient advised to take previously prescribed medications PRN for pain Patient to follow-up with [facility] Bleeding precautions  discussed Patient may return to MAU as needed or if her condition were to change or worsen  Farris Has, PA-C  02/28/2014, 6:33 PM

## 2014-02-28 NOTE — Discharge Instructions (Signed)
Abnormal Uterine Bleeding Abnormal uterine bleeding can affect women at various stages in life, including teenagers, women in their reproductive years, pregnant women, and women who have reached menopause. Several kinds of uterine bleeding are considered abnormal, including:  Bleeding or spotting between periods.   Bleeding after sexual intercourse.   Bleeding that is heavier or more than normal.   Periods that last longer than usual.  Bleeding after menopause.  Many cases of abnormal uterine bleeding are minor and simple to treat, while others are more serious. Any type of abnormal bleeding should be evaluated by your health care provider. Treatment will depend on the cause of the bleeding. HOME CARE INSTRUCTIONS Monitor your condition for any changes. The following actions may help to alleviate any discomfort you are experiencing:  Avoid the use of tampons and douches as directed by your health care provider.  Change your pads frequently. You should get regular pelvic exams and Pap tests. Keep all follow-up appointments for diagnostic tests as directed by your health care provider.  SEEK MEDICAL CARE IF:   Your bleeding lasts more than 1 week.   You feel dizzy at times.  SEEK IMMEDIATE MEDICAL CARE IF:   You pass out.   You are changing pads every 15 to 30 minutes.   You have abdominal pain.  You have a fever.   You become sweaty or weak.   You are passing large blood clots from the vagina.   You start to feel nauseous and vomit. MAKE SURE YOU:   Understand these instructions.  Will watch your condition.  Will get help right away if you are not doing well or get worse. Document Released: 12/15/2005 Document Revised: 08/17/2013 Document Reviewed: 07/14/2013 [facility] Patient Information 2014 [place], [place]. Medroxyprogesterone injection [Contraceptive] What is this medicine? MEDROXYPROGESTERONE (me DROX ee proe JES te rone) contraceptive injections  prevent pregnancy. They provide effective birth control for 3 months. Depo-subQ Provera 104 is also used for treating pain related to endometriosis. This medicine may be used for other purposes; ask your health care provider or pharmacist if you have questions. COMMON BRAND NAME(S): Depo-Provera, Depo-subQ Provera 104 What should I tell my health care provider before I take this medicine? They need to know if you have any of these conditions: -frequently drink alcohol -asthma -blood vessel disease or a history of a blood clot in the lungs or legs -bone disease such as osteoporosis -breast cancer -diabetes -eating disorder (anorexia nervosa or bulimia) -high blood pressure -HIV infection or AIDS -kidney disease -liver disease -mental depression -migraine -seizures (convulsions) -stroke -tobacco smoker -vaginal bleeding -an unusual or allergic reaction to medroxyprogesterone, other hormones, medicines, foods, dyes, or preservatives -pregnant or trying to get pregnant -breast-feeding How should I use this medicine? Depo-Provera Contraceptive injection is given into a muscle. Depo-subQ Provera 104 injection is given under the skin. These injections are given by a health care professional. You must not be pregnant before getting an injection. The injection is usually given during the first 5 days after the start of a menstrual period or 6 weeks after delivery of a baby. Talk to your pediatrician regarding the use of this medicine in children. Special care may be needed. These injections have been used in female children who have started having menstrual periods. Overdosage: If you think you have taken too much of this medicine contact a poison control center or emergency room at once. NOTE: This medicine is only for you. Do not share this medicine with others. What if  I miss a dose? Try not to miss a dose. You must get an injection once every 3 months to maintain birth control. If you  cannot keep an appointment, call and reschedule it. If you wait longer than 13 weeks between Depo-Provera contraceptive injections or longer than 14 weeks between Depo-subQ Provera 104 injections, you could get pregnant. Use another method for birth control if you miss your appointment. You may also need a pregnancy test before receiving another injection. What may interact with this medicine? Do not take this medicine with any of the following medications: -bosentan This medicine may also interact with the following medications: -aminoglutethimide -antibiotics or medicines for infections, especially rifampin, rifabutin, rifapentine, and griseofulvin -aprepitant -barbiturate medicines such as phenobarbital or primidone -bexarotene -carbamazepine -medicines for seizures like ethotoin, felbamate, oxcarbazepine, phenytoin, topiramate -modafinil -St. John's wort This list may not describe all possible interactions. Give your health care provider a list of all the medicines, herbs, non-prescription drugs, or dietary supplements you use. Also tell them if you smoke, drink alcohol, or use illegal drugs. Some items may interact with your medicine. What should I watch for while using this medicine? This drug does not protect you against HIV infection (AIDS) or other sexually transmitted diseases. Use of this product may cause you to lose calcium from your bones. Loss of calcium may cause weak bones (osteoporosis). Only use this product for more than 2 years if other forms of birth control are not right for you. The longer you use this product for birth control the more likely you will be at risk for weak bones. Ask your health care professional how you can keep strong bones. You may have a change in bleeding pattern or irregular periods. Many females stop having periods while taking this drug. If you have received your injections on time, your chance of being pregnant is very low. If you think you may be  pregnant, see your health care professional as soon as possible. Tell your health care professional if you want to get pregnant within the next year. The effect of this medicine may last a long time after you get your last injection. What side effects may I notice from receiving this medicine? Side effects that you should report to your doctor or health care professional as soon as possible: -allergic reactions like skin rash, itching or hives, swelling of the face, lips, or tongue -breast tenderness or discharge -breathing problems -changes in vision -depression -feeling faint or lightheaded, falls -fever -pain in the abdomen, chest, groin, or leg -problems with balance, talking, walking -unusually weak or tired -yellowing of the eyes or skin Side effects that usually do not require medical attention (report to your doctor or health care professional if they continue or are bothersome): -acne -fluid retention and swelling -headache -irregular periods, spotting, or absent periods -temporary pain, itching, or skin reaction at site where injected -weight gain This list may not describe all possible side effects. Call your doctor for medical advice about side effects. You may report side effects to FDA at 1-800-FDA-1088. Where should I keep my medicine? This does not apply. The injection will be given to you by a health care professional. NOTE: This sheet is a summary. It may not cover all possible information. If you have questions about this medicine, talk to your doctor, pharmacist, or health care provider.  2014, Elsevier/Gold Standard. (2009-01-05 18:37:56)

## 2014-02-28 NOTE — MAU Note (Signed)
States she goes to [facility]. Was seen there about 2 months ago and will go back in April for Depo shot. Was taking Depo for heavy bleeding. Has had a BTL.

## 2014-03-06 NOTE — MAU Provider Note (Signed)
Attestation of Attending Supervision of Advanced Practitioner (CNM/NP): Evaluation and management procedures were performed by the Advanced Practitioner under my supervision and collaboration.  I have reviewed the Advanced Practitioner's note and chart, and I agree with the management and plan.  Richell Corker 03/06/2014 8:40 AM

## 2014-04-07 ENCOUNTER — Ambulatory Visit: Payer: Medicare Other

## 2014-04-07 ENCOUNTER — Ambulatory Visit (INDEPENDENT_AMBULATORY_CARE_PROVIDER_SITE_OTHER): Payer: Medicare Other | Admitting: *Deleted

## 2014-04-07 VITALS — BP 124/79 | HR 104 | Wt 293.7 lb

## 2014-04-07 DIAGNOSIS — N938 Other specified abnormal uterine and vaginal bleeding: Secondary | ICD-10-CM

## 2014-04-07 DIAGNOSIS — N949 Unspecified condition associated with female genital organs and menstrual cycle: Secondary | ICD-10-CM

## 2014-04-07 DIAGNOSIS — N925 Other specified irregular menstruation: Secondary | ICD-10-CM

## 2014-04-07 MED ORDER — MEDROXYPROGESTERONE ACETATE 150 MG/ML IM SUSP
150.0000 mg | Freq: Once | INTRAMUSCULAR | Status: AC
  Administered 2014-04-07: 150 mg via INTRAMUSCULAR

## 2014-04-07 NOTE — Progress Notes (Signed)
States had IUD removed in January and started depoprovera. States has bleed everyday since then, some days light - some days heavy. Discussed with patient we will change to depo 150 mg and see if that works better. Discussed to come to MAU if feels dizzy, lightheaded or heavy bleeding.  Right now patient c/o feeling tired. But not dizzy or lightheaded. We discussed iron rich foods, etc. Also instructed patient to call in mid May if bleeding still not improved and we will discuss with provider if it time to change plan of care and she may need a provider evaluation.

## 2014-06-19 ENCOUNTER — Ambulatory Visit (INDEPENDENT_AMBULATORY_CARE_PROVIDER_SITE_OTHER): Payer: Medicare Other | Admitting: Family Medicine

## 2014-06-19 ENCOUNTER — Encounter: Payer: Self-pay | Admitting: Family Medicine

## 2014-06-19 VITALS — BP 140/94 | HR 106 | Ht 60.0 in | Wt 290.9 lb

## 2014-06-19 DIAGNOSIS — N925 Other specified irregular menstruation: Secondary | ICD-10-CM

## 2014-06-19 DIAGNOSIS — Z3049 Encounter for surveillance of other contraceptives: Secondary | ICD-10-CM

## 2014-06-19 DIAGNOSIS — Z3042 Encounter for surveillance of injectable contraceptive: Secondary | ICD-10-CM | POA: Insufficient documentation

## 2014-06-19 DIAGNOSIS — N949 Unspecified condition associated with female genital organs and menstrual cycle: Secondary | ICD-10-CM

## 2014-06-19 DIAGNOSIS — N938 Other specified abnormal uterine and vaginal bleeding: Secondary | ICD-10-CM

## 2014-06-19 MED ORDER — ONDANSETRON HCL 4 MG PO TABS: 4.0000 mg | ORAL_TABLET | Freq: Three times a day (TID) | ORAL | Status: DC | PRN

## 2014-06-19 MED ORDER — MEDROXYPROGESTERONE ACETATE 10 MG PO TABS: 20.0000 mg | ORAL_TABLET | Freq: Every day | ORAL | Status: DC

## 2014-06-19 NOTE — Patient Instructions (Signed)

## 2014-06-19 NOTE — Progress Notes (Signed)
GYNECOLOGY OFFICE NOTE  Chief Complaint:  Dysfunctional bleeding  Primary Care Physician: Elizabeth Palau, MD  HPI:  Melinda Ruiz who is a 33 yo [id number] who presents for dysfunctional bleeding.    - had IUD put in last summer for menorrhagia - got taken out 1/15 due to being uncomfortable - got a 104mg  depo in January - then got a 150mg  depo in April - has been bleeding for the last 5 months  - at most fills up a pad a day  - occasionally spots  Has always had menorrhagia but worsened   No fevers, chills, abd pain,vomiting, no SOB, no palpitations +Fatigue, occasional cp.   PMHx:  Past Medical History  Diagnosis Date  . Complication of anesthesia     "Oxygen level drops"  . Kidney stones 2010  . Constipation   . Anemia     during pregancy  . 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  Has been seen at [facility]  . Depression   . Hypertension     takes meds daily  . Headache(784.0)   . Fibroid     Past Surgical History  Procedure Laterality Date  . Cesarean section  2006, 2011  . Cystectomy    . Lympectomy    . Lymp glands removed--under arm bil    . Cholecystectomy  06/2011  . Tubal ligation  2011  . Multiple tooth extractions  2003    and wisdom teeth  . Pilonidial cyst removed  1990's  . Carpal tunnel release  02/03/2012    Procedure: CARPAL TUNNEL RELEASE;  Surgeon: Meredith Pel, MD;  Location: [place];  Service: Orthopedics;  Laterality: Left;  left carpal tunnel release  . Ganglion cyst excision  02/03/2012    Procedure: REMOVAL GANGLION OF WRIST;  Surgeon: Meredith Pel, MD;  Location: [place];  Service: Orthopedics;  Laterality: Left;  left dorsal ganglion cyst excision  . Carpal tunnel release  01/11/2013    Procedure: CARPAL TUNNEL RELEASE;  Surgeon: Meredith Pel, MD;  Location: [place];  Service: Orthopedics;  Laterality: Right;  Hand Set, Small Retractors, Lead Hand    FAMHx:  Family History  Problem  Relation Age of Onset  . Kidney cancer Father   . Anesthesia problems Neg Hx     SOCHx:   reports that she quit smoking about 4 years ago. Her smoking use included Cigarettes. She smoked 0.25 packs per day. She has never used smokeless tobacco. She reports that she drinks alcohol. She reports that she does not use illicit drugs.  ALLERGIES:  Allergies  Allergen Reactions  . Penicillins Hives  . Oxycodone-Acetaminophen Itching    ROS: Pertinent ROS as seen in HPI. Otherwise negative.   HOME MEDS: Current Outpatient Prescriptions  Medication Sig Dispense Refill  . acetaminophen (TYLENOL) 500 MG tablet Take 500 mg by mouth every 6 (six) hours as needed for moderate pain or headache.       . hydrochlorothiazide (HYDRODIURIL) 12.5 MG tablet Take 12.5 mg by mouth daily.      Marland Kitchen ibuprofen (ADVIL,MOTRIN) 200 MG tablet Take 400 mg by mouth every 6 (six) hours as needed for moderate pain.      . medroxyPROGESTERone (DEPO-PROVERA) 150 MG/ML injection Inject 150 mg into the muscle every 3 (three) months.      . medroxyPROGESTERone (PROVERA) 10 MG tablet Take 2 tablets (20 mg total) by mouth daily.  21 tablet  0  .  ondansetron (ZOFRAN) 4 MG tablet Take 1 tablet (4 mg total) by mouth every 8 (eight) hours as needed for nausea or vomiting.  30 tablet  0   No current facility-administered medications for this visit.    LABS/IMAGING: No results found for this or any previous visit (from the past 48 hour(s)). No results found.  VITALS: BP 140/94  Pulse 106  Ht 5' (1.524 m)  Wt 290 lb 14.4 oz (131.951 kg)  BMI 56.81 kg/m2  EXAM: GENERAL: Well-developed, well-nourished female in no acute distress.  HEENT: Normocephalic, atraumatic. Sclerae anicteric.  NECK: Supple.   LUNGS: Clear to auscultation bilaterally.  HEART: Regular rate and rhythm. ABDOMEN: Soft, nontender, nondistended. No organomegaly. EXTREMITIES: No cyanosis, clubbing, or edema, 2+ distal pulses.  ASSESSMENT: Dysfunctional  uterine bleeding  Depo-Provera contraceptive status  PLAN: - dysfunctional uterine bleeding on depo provera. Likely first dose not strong enough and now just s/p second dose  - likely to resolve with continued depo use.  - rx of provera to try and stop bleeding for now x3 weeks. Warned it may worsen at first - urged to get depo dose on July 9th and f/u in 2 months if not improved.   - no signs/symptoms of anemia  BECK, [name]

## 2014-07-03 ENCOUNTER — Ambulatory Visit (INDEPENDENT_AMBULATORY_CARE_PROVIDER_SITE_OTHER): Payer: Medicare Other

## 2014-07-03 VITALS — BP 135/82 | HR 88 | Temp 98.8°F | Ht 60.0 in | Wt 290.9 lb

## 2014-07-03 DIAGNOSIS — N949 Unspecified condition associated with female genital organs and menstrual cycle: Secondary | ICD-10-CM

## 2014-07-03 DIAGNOSIS — N938 Other specified abnormal uterine and vaginal bleeding: Secondary | ICD-10-CM

## 2014-07-03 DIAGNOSIS — N925 Other specified irregular menstruation: Secondary | ICD-10-CM

## 2014-07-03 MED ORDER — MEDROXYPROGESTERONE ACETATE 150 MG/ML IM SUSP
150.0000 mg | Freq: Once | INTRAMUSCULAR | Status: AC
  Administered 2014-07-03: 150 mg via INTRAMUSCULAR

## 2014-08-08 ENCOUNTER — Ambulatory Visit (HOSPITAL_BASED_OUTPATIENT_CLINIC_OR_DEPARTMENT_OTHER): Payer: Medicare Other | Attending: Internal Medicine | Admitting: Radiology

## 2014-08-08 VITALS — Ht 60.0 in | Wt 289.0 lb

## 2014-08-08 DIAGNOSIS — R0609 Other forms of dyspnea: Secondary | ICD-10-CM | POA: Insufficient documentation

## 2014-08-08 DIAGNOSIS — G4763 Sleep related bruxism: Secondary | ICD-10-CM | POA: Diagnosis not present

## 2014-08-08 DIAGNOSIS — R0989 Other specified symptoms and signs involving the circulatory and respiratory systems: Secondary | ICD-10-CM | POA: Insufficient documentation

## 2014-08-08 DIAGNOSIS — G4730 Sleep apnea, unspecified: Secondary | ICD-10-CM | POA: Diagnosis present

## 2014-08-08 DIAGNOSIS — G4733 Obstructive sleep apnea (adult) (pediatric): Secondary | ICD-10-CM

## 2014-08-08 DIAGNOSIS — G4710 Hypersomnia, unspecified: Secondary | ICD-10-CM | POA: Diagnosis present

## 2014-08-12 DIAGNOSIS — G4733 Obstructive sleep apnea (adult) (pediatric): Secondary | ICD-10-CM

## 2014-08-12 NOTE — Sleep Study (Signed)
   NAME: Melinda Ruiz DATE OF BIRTH:  1981/10/18 MEDICAL RECORD NUMBER [id number]  LOCATION: [facility]  PHYSICIAN: Dotsie Gillette D  DATE OF STUDY: 08/08/2014  SLEEP STUDY TYPE: Nocturnal Polysomnogram               REFERRING PHYSICIAN: Lillia Corporal, MD  INDICATION FOR STUDY: Hypersomnia with sleep apnea  EPWORTH SLEEPINESS SCORE:   17/24 HEIGHT: 5' (152.4 cm)  WEIGHT: 289 lb (131.09 kg)    Body mass index is 56.44 kg/(m^2).  NECK SIZE: 15.5 in.  MEDICATIONS: Charted for review  SLEEP ARCHITECTURE: Total sleep time 343 minutes with sleep efficiency 85.8%. Stage I was 2.6%, stage II 76.5%, stage III 1.2%, REM 19.7% of total sleep time. Sleep latency 46 minutes, REM latency 58 minutes, awake after sleep onset 11.5 minutes, arousal index 8.9, bedtime medication: None  RESPIRATORY DATA: Apnea hypopneas index (AHI) 4.4 per hour. 25 total events scored including 5 obstructive apneas and 20 hypopneas. Events were not positional. REM AHI 16 per hour. There were not enough events were split CPAP titration.  OXYGEN DATA: Occasional moderate snoring with oxygen desaturation to a nadir of 80% and mean saturation through the study of 97.6% on room air  CARDIAC DATA: Sinus rhythm with occasional blocked PAC  MOVEMENT/PARASOMNIA: A few limb jerks were noted with little effect on sleep. Bruxism. Bathroom x1  IMPRESSION/ RECOMMENDATION:   1) Occasional respiratory event with sleep disturbance, within normal limits. AHI of 4.4 per hour (the normal range for adults is an AHI from 0-5 events per hour). Occasional moderate snoring with oxygen desaturation to a nadir of 80% and mean saturation of 97.6% on room air. 2) Bruxism was noted   Deneise Lever Diplomate, American Board of Sleep Medicine  ELECTRONICALLY SIGNED ON:  08/12/2014, 1:32 PM [place] PH: [phone]   FX: [phone] [place]

## 2014-08-21 ENCOUNTER — Telehealth: Payer: Self-pay | Admitting: General Practice

## 2014-08-21 ENCOUNTER — Ambulatory Visit: Payer: Medicare Other | Admitting: Obstetrics & Gynecology

## 2014-08-21 NOTE — Telephone Encounter (Signed)
Patient no showed for appt today. Called patient and she states she missed the appt because she is at the dentist with her kids and wants to reschedule. Told patient I will let our front office know and they will contact you with a new appt, which will probably be sometime in September or October. Patient verbalized understanding and had no questions

## 2014-08-30 ENCOUNTER — Encounter: Payer: Self-pay | Admitting: Obstetrics & Gynecology

## 2014-08-30 ENCOUNTER — Ambulatory Visit (INDEPENDENT_AMBULATORY_CARE_PROVIDER_SITE_OTHER): Payer: Medicare Other | Admitting: Obstetrics & Gynecology

## 2014-08-30 VITALS — BP 139/91 | HR 100 | Ht 60.0 in | Wt 291.1 lb

## 2014-08-30 DIAGNOSIS — R350 Frequency of micturition: Secondary | ICD-10-CM

## 2014-08-30 DIAGNOSIS — N898 Other specified noninflammatory disorders of vagina: Secondary | ICD-10-CM

## 2014-08-30 LAB — WET PREP, GENITAL
CLUE CELLS WET PREP: NONE SEEN
Trich, Wet Prep: NONE SEEN
Yeast Wet Prep HPF POC: NONE SEEN

## 2014-08-30 LAB — POCT URINALYSIS DIP (DEVICE)
Bilirubin Urine: NEGATIVE
GLUCOSE, UA: NEGATIVE mg/dL
Hgb urine dipstick: NEGATIVE
KETONES UR: 15 mg/dL — AB
Leukocytes, UA: NEGATIVE
Nitrite: NEGATIVE
Protein, ur: 30 mg/dL — AB
Specific Gravity, Urine: 1.015 (ref 1.005–1.030)
Urobilinogen, UA: 1 mg/dL (ref 0.0–1.0)
pH: 6 (ref 5.0–8.0)

## 2014-08-30 NOTE — Progress Notes (Signed)
Subjective:     Patient ID: Melinda Ruiz, female   DOB: 1981/10/10, 33 y.o.   MRN: [id number]  HPI [id number] No LMP recorded. Patient has had an injection.  1 week with milky discharge per vaginal and odor and slight itch. Urinary frequency and pressure Current Outpatient Prescriptions on File Prior to Visit  Medication Sig Dispense Refill  . medroxyPROGESTERone (DEPO-PROVERA) 150 MG/ML injection Inject 150 mg into the muscle every 3 (three) months.      Marland Kitchen acetaminophen (TYLENOL) 500 MG tablet Take 500 mg by mouth every 6 (six) hours as needed for moderate pain or headache.       . hydrochlorothiazide (HYDRODIURIL) 12.5 MG tablet Take 12.5 mg by mouth daily.      Marland Kitchen ibuprofen (ADVIL,MOTRIN) 200 MG tablet Take 400 mg by mouth every 6 (six) hours as needed for moderate pain.      . medroxyPROGESTERone (PROVERA) 10 MG tablet Take 2 tablets (20 mg total) by mouth daily.  21 tablet  0  . ondansetron (ZOFRAN) 4 MG tablet Take 1 tablet (4 mg total) by mouth every 8 (eight) hours as needed for nausea or vomiting.  30 tablet  0   No current facility-administered medications on file prior to visit.   Allergies  Allergen Reactions  . Penicillins Hives  . Oxycodone-Acetaminophen Itching     Review of Systems  Constitutional: Negative.   Genitourinary: Positive for urgency, frequency and vaginal discharge. Negative for vaginal bleeding, menstrual problem and pelvic pain.       Objective:   Physical Exam  Constitutional: She appears well-developed.  obese  Pulmonary/Chest: Effort normal. No respiratory distress.  Abdominal: Soft. There is no tenderness.  Genitourinary: Uterus normal. Vaginal discharge (scant white, wet prep) found.  Skin: Skin is warm and dry.  Psychiatric: She has a normal mood and affect. Her behavior is normal.   Urine dipstick shows positive for ketones.  Micro exam: not done.     Assessment:     Vaginal discharge wet prep pending     Plan:     F/u on result  and notify patient  Woodroe Mode, MD

## 2014-08-30 NOTE — Patient Instructions (Signed)

## 2014-08-31 ENCOUNTER — Telehealth: Payer: Self-pay

## 2014-08-31 NOTE — Telephone Encounter (Signed)
Message copied by Geanie Logan on Thu Aug 31, 2014 11:06 AM ------      Message from: Woodroe Mode      Created: Thu Aug 31, 2014  9:35 AM       Negative for vaginitis ------

## 2014-08-31 NOTE — Telephone Encounter (Signed)
Patient informed of results. No questions or concerns.

## 2014-09-13 ENCOUNTER — Other Ambulatory Visit: Payer: Self-pay | Admitting: Orthopedic Surgery

## 2014-09-13 DIAGNOSIS — M542 Cervicalgia: Secondary | ICD-10-CM

## 2014-09-14 ENCOUNTER — Encounter (HOSPITAL_BASED_OUTPATIENT_CLINIC_OR_DEPARTMENT_OTHER): Payer: Medicare Other

## 2014-09-16 ENCOUNTER — Ambulatory Visit
Admission: RE | Admit: 2014-09-16 | Discharge: 2014-09-16 | Disposition: A | Discharge status: Home / Self Care | Payer: Medicare Other | Source: Ambulatory Visit | Attending: Orthopedic Surgery | Admitting: Orthopedic Surgery

## 2014-09-16 DIAGNOSIS — M542 Cervicalgia: Secondary | ICD-10-CM

## 2014-09-18 ENCOUNTER — Ambulatory Visit (INDEPENDENT_AMBULATORY_CARE_PROVIDER_SITE_OTHER): Payer: Medicare Other | Admitting: *Deleted

## 2014-09-18 VITALS — BP 126/69 | HR 99 | Ht 61.0 in | Wt 295.4 lb

## 2014-09-18 DIAGNOSIS — N921 Excessive and frequent menstruation with irregular cycle: Secondary | ICD-10-CM

## 2014-09-18 DIAGNOSIS — N920 Excessive and frequent menstruation with regular cycle: Secondary | ICD-10-CM

## 2014-09-18 MED ORDER — MEDROXYPROGESTERONE ACETATE 150 MG/ML IM SUSP
150.0000 mg | Freq: Once | INTRAMUSCULAR | Status: AC
Start: 2014-09-18 — End: 2014-09-18
  Administered 2014-09-18: 150 mg via INTRAMUSCULAR

## 2014-10-30 ENCOUNTER — Encounter: Payer: Self-pay | Admitting: Obstetrics & Gynecology

## 2014-12-11 ENCOUNTER — Ambulatory Visit (INDEPENDENT_AMBULATORY_CARE_PROVIDER_SITE_OTHER): Payer: Medicare Other | Admitting: *Deleted

## 2014-12-11 VITALS — BP 112/64 | HR 86 | Temp 98.3°F | Wt 297.1 lb

## 2014-12-11 DIAGNOSIS — Z3042 Encounter for surveillance of injectable contraceptive: Secondary | ICD-10-CM

## 2014-12-11 MED ORDER — MEDROXYPROGESTERONE ACETATE 150 MG/ML IM SUSP
150.0000 mg | Freq: Once | INTRAMUSCULAR | Status: AC
  Administered 2014-12-11: 150 mg via INTRAMUSCULAR

## 2015-02-28 ENCOUNTER — Ambulatory Visit: Payer: Medicare Other

## 2015-03-25 ENCOUNTER — Encounter (HOSPITAL_COMMUNITY): Payer: Self-pay | Admitting: Emergency Medicine

## 2015-03-25 ENCOUNTER — Emergency Department (HOSPITAL_COMMUNITY)
Admission: EM | Admit: 2015-03-25 | Discharge: 2015-03-25 | Disposition: A | Discharge status: Home / Self Care | Payer: Medicare Other | Attending: Emergency Medicine | Admitting: Emergency Medicine

## 2015-03-25 DIAGNOSIS — Z87891 Personal history of nicotine dependence: Secondary | ICD-10-CM | POA: Insufficient documentation

## 2015-03-25 DIAGNOSIS — Z79899 Other long term (current) drug therapy: Secondary | ICD-10-CM | POA: Diagnosis not present

## 2015-03-25 DIAGNOSIS — D649 Anemia, unspecified: Secondary | ICD-10-CM | POA: Diagnosis not present

## 2015-03-25 DIAGNOSIS — R1310 Dysphagia, unspecified: Secondary | ICD-10-CM | POA: Diagnosis present

## 2015-03-25 DIAGNOSIS — J029 Acute pharyngitis, unspecified: Secondary | ICD-10-CM

## 2015-03-25 DIAGNOSIS — F329 Major depressive disorder, single episode, unspecified: Secondary | ICD-10-CM | POA: Insufficient documentation

## 2015-03-25 DIAGNOSIS — I10 Essential (primary) hypertension: Secondary | ICD-10-CM | POA: Insufficient documentation

## 2015-03-25 DIAGNOSIS — E119 Type 2 diabetes mellitus without complications: Secondary | ICD-10-CM | POA: Insufficient documentation

## 2015-03-25 DIAGNOSIS — Z9851 Tubal ligation status: Secondary | ICD-10-CM | POA: Insufficient documentation

## 2015-03-25 DIAGNOSIS — Z880 Allergy status to penicillin: Secondary | ICD-10-CM | POA: Insufficient documentation

## 2015-03-25 LAB — RAPID STREP SCREEN (MED CTR MEBANE ONLY): Streptococcus, Group A Screen (Direct): NEGATIVE

## 2015-03-25 MED ORDER — DEXAMETHASONE SODIUM PHOSPHATE 10 MG/ML IJ SOLN
10.0000 mg | Freq: Once | INTRAMUSCULAR | Status: AC
  Administered 2015-03-25: 10 mg via INTRAMUSCULAR
  Filled 2015-03-25: qty 1

## 2015-03-25 NOTE — Discharge Instructions (Signed)

## 2015-03-25 NOTE — ED Notes (Signed)
Onset 1 week mild cough, coughs few times day and feels like something in throat causing pressure.  No soreness noted to throat.  Bilateral tonsils swollen.  No swallowing, breathing, resp difficulties.

## 2015-03-25 NOTE — ED Provider Notes (Signed)
CSN: [id number]     Arrival date & time 03/25/15  1530 History  This chart was scribed for non-physician practitioner, Glendell Docker, NP, working with Dorie Rank, MD, by Ian Bushman, ED Scribe. This patient was seen in room TR08C/TR08C and the patient's care was started at 3:44 PM.   None    Chief Complaint  Patient presents with  . Dysphagia  . Cough     (Consider location/radiation/quality/duration/timing/severity/associated sxs/prior Treatment) Patient is a 34 y.o. female presenting with cough. The history is provided by the patient. No language interpreter was used.  Cough   HPI Comments: Melinda Ruiz is a 34 y.o. female who presents to the Emergency Department complaining of difficulty swallowing onset a week and a half ago with associated symptoms of coughing (mucous with small traces of blood) . Patient has a history of diabetes and high blood pressure.  She takes medication for diabetes but doesn't check her sugar. Patient is not a smoker. Denies fever. Hasn't tried anything for the symptoms   Past Medical History  Diagnosis Date  . Complication of anesthesia     "Oxygen level drops"  . Kidney stones 2010  . Constipation   . Anemia     during pregancy  . 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  Has been seen at [facility]  . Depression   . Hypertension     takes meds daily  . Headache(784.0)   . Fibroid    Past Surgical History  Procedure Laterality Date  . Cesarean section  2006, 2011  . Cystectomy    . Lympectomy    . Lymp glands removed--under arm bil    . Cholecystectomy  06/2011  . Tubal ligation  2011  . Multiple tooth extractions  2003    and wisdom teeth  . Pilonidial cyst removed  1990's  . Carpal tunnel release  02/03/2012    Procedure: CARPAL TUNNEL RELEASE;  Surgeon: Meredith Pel, MD;  Location: [place];  Service: Orthopedics;  Laterality: Left;  left carpal tunnel release  . Ganglion cyst excision  02/03/2012     Procedure: REMOVAL GANGLION OF WRIST;  Surgeon: Meredith Pel, MD;  Location: [place];  Service: Orthopedics;  Laterality: Left;  left dorsal ganglion cyst excision  . Carpal tunnel release  01/11/2013    Procedure: CARPAL TUNNEL RELEASE;  Surgeon: Meredith Pel, MD;  Location: [place];  Service: Orthopedics;  Laterality: Right;  Hand Set, Small Retractors, Lead Hand   Family History  Problem Relation Age of Onset  . Kidney cancer Father   . Anesthesia problems Neg Hx    History  Substance Use Topics  . Smoking status: Former Smoker -- 0.25 packs/day    Types: Cigarettes    Quit date: 10/01/2009  . Smokeless tobacco: Never Used  . Alcohol Use: Yes     Comment: occasionally   OB History    Gravida Para Term Preterm AB TAB SAB Ectopic Multiple Living   3 2 2  0 1 0 1 0 0 2     Review of Systems  HENT: Positive for trouble swallowing.   Respiratory: Positive for cough.   All other systems reviewed and are negative.     Allergies  Penicillins and Oxycodone-acetaminophen  Home Medications   Prior to Admission medications   Medication Sig Start Date End Date Taking? Authorizing Provider  acetaminophen (TYLENOL) 500 MG tablet Take 500 mg by mouth every 6 (six)  hours as needed for moderate pain or headache.     Historical Provider, MD  hydrochlorothiazide (HYDRODIURIL) 12.5 MG tablet Take 12.5 mg by mouth daily.    Historical Provider, MD  ibuprofen (ADVIL,MOTRIN) 200 MG tablet Take 400 mg by mouth every 6 (six) hours as needed for moderate pain.    Historical Provider, MD  medroxyPROGESTERone (DEPO-PROVERA) 150 MG/ML injection Inject 150 mg into the muscle every 3 (three) months.    Historical Provider, MD  medroxyPROGESTERone (PROVERA) 10 MG tablet Take 2 tablets (20 mg total) by mouth daily. 06/19/14   Kassie Mends, MD  ondansetron (ZOFRAN) 4 MG tablet Take 1 tablet (4 mg total) by mouth every 8 (eight) hours as needed for nausea or vomiting. 06/19/14   Kassie Mends, MD    BP 149/98 mmHg  Pulse 111  Temp(Src) 98.1 F (36.7 C) (Oral)  Resp 16  Ht 5' (1.524 m)  Wt 310 lb (140.615 kg)  BMI 60.54 kg/m2  SpO2 98% Physical Exam  Constitutional: She is oriented to person, place, and time. She appears well-developed and well-nourished. No distress.  HENT:  Head: Normocephalic and atraumatic.  Right Ear: External ear normal.  Left Ear: External ear normal.  Swelling without redness. No pta  Eyes: Conjunctivae and EOM are normal.  Neck: Neck supple.  Cardiovascular: Normal rate.   Pulmonary/Chest: Effort normal and breath sounds normal. No respiratory distress.  Musculoskeletal: Normal range of motion.  Neurological: She is alert and oriented to person, place, and time.  Skin: Skin is warm and dry.  Psychiatric: She has a normal mood and affect. Her behavior is normal.  Nursing note and vitals reviewed.   ED Course  Procedures (including critical care time) DIAGNOSTIC STUDIES: Oxygen Saturation is 98% on RA, normal by my interpretation.    COORDINATION OF CARE: 3:46 PM Discussed treatment plan with patient at beside, the patient agrees with the plan and has no further questions at this time.   Labs Review Labs Reviewed  RAPID STREP SCREEN  CULTURE, GROUP A STREP    Imaging Review No results found.   EKG Interpretation None      MDM   Final diagnoses:  Pharyngitis   dedadron im given. Strep negative. Likely viral  I personally performed the services described in this documentation, which was scribed in my presence. The recorded information has been reviewed and is accurate.    Glendell Docker, NP 03/25/15 [place], MD 03/27/15 1606

## 2015-03-25 NOTE — ED Notes (Addendum)
Pt c/o difficulty swallowing x 1 week and cough. Pt able to swallow saliva. Pt talking in complete sentences without difficulty. Pt with swelling to B/L tonsils

## 2015-03-28 LAB — CULTURE, GROUP A STREP

## 2015-04-12 ENCOUNTER — Other Ambulatory Visit: Payer: Self-pay | Admitting: General Surgery

## 2015-04-17 ENCOUNTER — Encounter: Payer: Medicare Other | Attending: General Surgery | Admitting: Dietician

## 2015-04-17 ENCOUNTER — Encounter: Payer: Self-pay | Admitting: Dietician

## 2015-04-17 VITALS — Ht 61.5 in | Wt 303.7 lb

## 2015-04-17 DIAGNOSIS — Z713 Dietary counseling and surveillance: Secondary | ICD-10-CM | POA: Diagnosis not present

## 2015-04-17 DIAGNOSIS — Z6841 Body Mass Index (BMI) 40.0 and over, adult: Secondary | ICD-10-CM | POA: Insufficient documentation

## 2015-04-17 DIAGNOSIS — E669 Obesity, unspecified: Secondary | ICD-10-CM | POA: Diagnosis present

## 2015-04-17 DIAGNOSIS — E119 Type 2 diabetes mellitus without complications: Secondary | ICD-10-CM | POA: Insufficient documentation

## 2015-04-17 NOTE — Patient Instructions (Signed)

## 2015-04-17 NOTE — Progress Notes (Signed)
  Pre-Op Assessment Visit:  Pre-Operative RYGB Surgery  Medical Nutrition Therapy:  Appt start time: 0800   End time:  0830.  Patient was seen on 04/17/2015 for Pre-Operative Nutrition Assessment. Assessment and letter of approval faxed to [facility] Surgery Bariatric Surgery Program coordinator on 04/17/2015.   Preferred Learning Style:   No preference indicated   Learning Readiness:   Ready  Handouts given during visit include:  Pre-Op Goals Bariatric Surgery Protein Shakes  During the appointment today the following Pre-Op Goals were reviewed with the patient: Maintain or lose weight as instructed by your surgeon Make healthy food choices Begin to limit portion sizes Limited concentrated sugars and fried foods Keep fat/sugar in the single digits per serving on   food labels Practice CHEWING your food  (aim for 30 chews per bite or until applesauce consistency) Practice not drinking 15 minutes before, during, and 30 minutes after each meal/snack Avoid all carbonated beverages  Avoid/limit caffeinated beverages  Avoid all sugar-sweetened beverages Consume 3 meals per day; eat every 3-5 hours Make a list of non-food related activities Aim for 64-100 ounces of FLUID daily  Aim for at least 60-80 grams of PROTEIN daily Look for a liquid protein source that contain ?15 g protein and ?5 g carbohydrate  (ex: shakes, drinks, shots)  Patient-Centered Goals: Znya would like to have more stable blood pressure, come off diabetes medication, and improve sleep apnea. 8 level of confidence/10 level of importance  Demonstrated degree of understanding via:  Teach Back  Teaching Method Utilized:  Visual Auditory Hands on  Barriers to learning/adherence to lifestyle change: limited income  Patient to call the [facility] to enroll in Pre-Op and Post-Op Nutrition Education when surgery date is scheduled.

## 2015-04-19 ENCOUNTER — Ambulatory Visit: Payer: Medicare Other

## 2015-04-23 ENCOUNTER — Ambulatory Visit (INDEPENDENT_AMBULATORY_CARE_PROVIDER_SITE_OTHER): Payer: Medicare Other | Admitting: *Deleted

## 2015-04-23 ENCOUNTER — Encounter: Payer: Self-pay | Admitting: *Deleted

## 2015-04-23 VITALS — BP 110/52 | HR 95 | Temp 98.4°F | Wt 303.7 lb

## 2015-04-23 DIAGNOSIS — N938 Other specified abnormal uterine and vaginal bleeding: Secondary | ICD-10-CM | POA: Diagnosis not present

## 2015-04-23 DIAGNOSIS — Z3202 Encounter for pregnancy test, result negative: Secondary | ICD-10-CM | POA: Diagnosis not present

## 2015-04-23 DIAGNOSIS — Z30013 Encounter for initial prescription of injectable contraceptive: Secondary | ICD-10-CM

## 2015-04-23 LAB — POCT PREGNANCY, URINE: Preg Test, Ur: NEGATIVE

## 2015-04-23 MED ORDER — MEDROXYPROGESTERONE ACETATE 150 MG/ML IM SUSP
150.0000 mg | Freq: Once | INTRAMUSCULAR | Status: AC
  Administered 2015-04-23: 150 mg via INTRAMUSCULAR

## 2015-04-30 ENCOUNTER — Encounter: Payer: Self-pay | Admitting: Cardiology

## 2015-04-30 ENCOUNTER — Ambulatory Visit (INDEPENDENT_AMBULATORY_CARE_PROVIDER_SITE_OTHER): Payer: Medicare Other | Admitting: Cardiology

## 2015-04-30 VITALS — BP 135/84 | HR 104 | Ht 61.0 in | Wt 303.8 lb

## 2015-04-30 DIAGNOSIS — E669 Obesity, unspecified: Secondary | ICD-10-CM

## 2015-04-30 DIAGNOSIS — I10 Essential (primary) hypertension: Secondary | ICD-10-CM | POA: Diagnosis not present

## 2015-04-30 DIAGNOSIS — R0602 Shortness of breath: Secondary | ICD-10-CM | POA: Diagnosis not present

## 2015-04-30 NOTE — Patient Instructions (Signed)
Medication Instructions:  Your physician recommends that you continue on your current medications as directed. Please refer to the Current Medication list given to you today.   Labwork: None  Testing/Procedures: Your physician has requested that you have an echocardiogram. Echocardiography is a painless test that uses sound waves to create images of your heart. It provides your doctor with information about the size and shape of your heart and how well your heart's chambers and valves are working. This procedure takes approximately one hour. There are no restrictions for this procedure.  Your physician has requested that you have an exercise tolerance test. For further information please visit [email or web address]. Please also follow instruction sheet, as given.  Follow-Up: Your physician recommends that you schedule a follow-up appointment AS NEEDED with Dr. Radford Pax pending your study results.  Any Other Special Instructions Will Be Listed Below (If Applicable).

## 2015-04-30 NOTE — Progress Notes (Signed)
Cardiology Office Note   Date:  04/30/2015   ID:  Melinda Ruiz, DOB Nov 28, 1981, MRN [id number]  PCP:  Melinda Palau, MD    Chief Complaint  Patient presents with  . Shortness of Breath  . preoperative clearance  . Obesity      History of Present Illness: Melinda Ruiz is a 34 y.o. female who presents for preoperative surgical clearance for weight loss surgery.  She has made numerous attempts at weight loss that have been unsuccessful. She has a history of HTN, OSA (she has not gotten her CPAP yet) and type II DM.  She has some SOB at rest and with exertion.  She has trouble breathing when lying flat.  She says that occasionally will have some sharp pain or heaviness on her left breast which is nonexertiona.  She occasionally has some ankle edema. She denies any dizziness or syncope.  Occasionally she will feel her heart skip.      Past Medical History  Diagnosis Date  . Complication of anesthesia     "Oxygen level drops"  . Kidney stones 2010  . Constipation   . Anemia     during pregancy  . 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  Has been seen at [facility]  . Depression   . Hypertension     takes meds daily  . Headache(784.0)   . Fibroid   . Diabetes mellitus without complication   . Morbid obesity with BMI of 50.0-59.9, adult     Past Surgical History  Procedure Laterality Date  . Cesarean section  2006, 2011  . Cystectomy    . Lympectomy    . Lymp glands removed--under arm bil    . Cholecystectomy  06/2011  . Tubal ligation  2011  . Multiple tooth extractions  2003    and wisdom teeth  . Pilonidial cyst removed  1990's  . Carpal tunnel release  02/03/2012    Procedure: CARPAL TUNNEL RELEASE;  Surgeon: Meredith Pel, MD;  Location: [place];  Service: Orthopedics;  Laterality: Left;  left carpal tunnel release  . Ganglion cyst excision  02/03/2012    Procedure: REMOVAL GANGLION OF WRIST;  Surgeon: Meredith Pel, MD;  Location: [place];  Service: Orthopedics;  Laterality: Left;  left dorsal ganglion cyst excision  . Carpal tunnel release  01/11/2013    Procedure: CARPAL TUNNEL RELEASE;  Surgeon: Meredith Pel, MD;  Location: [place];  Service: Orthopedics;  Laterality: Right;  Hand Set, Small Retractors, Lead Hand     Current Outpatient Prescriptions  Medication Sig Dispense Refill  . acetaminophen (TYLENOL) 500 MG tablet Take 500 mg by mouth every 6 (six) hours as needed for moderate pain or headache.     . desonide (DESOWEN) 0.05 % cream Apply topically 2 (two) times daily.    Marland Kitchen ketoconazole (NIZORAL) 2 % cream     . loratadine (CLARITIN) 10 MG tablet Take 10 mg by mouth daily.    . medroxyPROGESTERone (DEPO-PROVERA) 150 MG/ML injection Inject 150 mg into the muscle every 3 (three) months.    . metFORMIN (GLUCOPHAGE) 500 MG tablet Take by mouth daily with breakfast.    . metoprolol tartrate (LOPRESSOR) 25 MG tablet Take 25 mg by mouth 2 (two) times daily.     No current facility-administered medications for this visit.    Allergies:   Penicillins and Oxycodone-acetaminophen    Social History:  The patient  reports that she quit smoking about 5 years ago. Her smoking use included Cigarettes. She smoked 0.25 packs per day. She has never used smokeless tobacco. She reports that she drinks alcohol. She reports that she does not use illicit drugs.   Family History:  The patient's family history includes Arthritis in her mother; Cancer in her father and mother; Diabetes Mellitus II in her father and mother; Hypertension in her father and mother; Kidney cancer in her father. There is no history of Anesthesia problems.    ROS:  Please see the history of present illness.   Otherwise, review of systems are positive for depression, snoring.   All other systems are reviewed and negative.    PHYSICAL EXAM: VS:  BP 135/84 mmHg  Pulse 104  Ht 5\' 1"  (1.549 m)  Wt 303 lb 12.8 oz (137.803 kg)  BMI  57.43 kg/m2  SpO2 98%  LMP 04/18/2015 , BMI Body mass index is 57.43 kg/(m^2). GEN: Well nourished, well developed, in no acute distress HEENT: normal Neck: no JVD, carotid bruits, or masses Cardiac: RRR; no murmurs, rubs, or gallops,no edema  Respiratory:  clear to auscultation bilaterally, normal work of breathing GI: soft, nontender, nondistended, + BS MS: no deformity or atrophy Skin: warm and dry, no rash Neuro:  Strength and sensation are intact Psych: euthymic mood, full affect   EKG:  EKG is ordered today showing NSR with no ST changes    Recent Labs: No results found for requested labs within last 365 days.    Lipid Panel No results found for: CHOL, TRIG, HDL, CHOLHDL, VLDL, LDLCALC, LDLDIRECT    Wt Readings from Last 3 Encounters:  04/30/15 303 lb 12.8 oz (137.803 kg)  04/23/15 303 lb 11.2 oz (137.757 kg)  04/17/15 303 lb 11.2 oz (137.757 kg)        ASSESSMENT AND PLAN:  1.  SOB most likely secondary to morbid obesity.  She does have CRF including morbid obesity, Type II DM and HTN.  EKG is nonischemic.  I will get an ETT to rule out ischemia sincer her EKG is normal and a 2D echo to assess LVF. 2.  Morbid obesity 3.  Preoperative clearance for bariatric surgery.  Her EKG is normal so I will get  4.  HTN controlled on BB  Current medicines are reviewed at length with the patient today.  The patient does not have concerns regarding medicines.  The following changes have been made:  no change  Labs/ tests ordered today include: see above assessment and plan  Orders Placed This Encounter  Procedures  . Exercise Tolerance Test  . EKG 12-Lead  . Echocardiogram     Disposition:   FU with me PRN pending results of studies  Signed, Melinda Margarita, MD  04/30/2015 3:01 PM    [facility] [address], [place], [place]  [address] Phone: [phone]; Fax: [phone]

## 2015-05-04 ENCOUNTER — Ambulatory Visit (HOSPITAL_COMMUNITY): Payer: Medicare Other | Attending: Cardiology

## 2015-05-04 ENCOUNTER — Other Ambulatory Visit: Payer: Self-pay

## 2015-05-04 DIAGNOSIS — R0602 Shortness of breath: Secondary | ICD-10-CM

## 2015-05-04 DIAGNOSIS — I10 Essential (primary) hypertension: Secondary | ICD-10-CM | POA: Diagnosis not present

## 2015-05-04 DIAGNOSIS — E119 Type 2 diabetes mellitus without complications: Secondary | ICD-10-CM | POA: Diagnosis not present

## 2015-05-04 DIAGNOSIS — R0600 Dyspnea, unspecified: Secondary | ICD-10-CM | POA: Diagnosis present

## 2015-05-04 DIAGNOSIS — Z87891 Personal history of nicotine dependence: Secondary | ICD-10-CM | POA: Diagnosis not present

## 2015-05-04 MED ORDER — PERFLUTREN LIPID MICROSPHERE
3.0000 mL | Freq: Once | INTRAVENOUS | Status: AC
  Administered 2015-05-04: 3 mL via INTRAVENOUS

## 2015-05-05 ENCOUNTER — Emergency Department (HOSPITAL_COMMUNITY): Payer: Medicare Other

## 2015-05-05 ENCOUNTER — Encounter (HOSPITAL_COMMUNITY): Payer: Self-pay | Admitting: *Deleted

## 2015-05-05 ENCOUNTER — Emergency Department (HOSPITAL_COMMUNITY)
Admission: EM | Admit: 2015-05-05 | Discharge: 2015-05-05 | Disposition: A | Discharge status: Home / Self Care | Payer: Medicare Other | Attending: Emergency Medicine | Admitting: Emergency Medicine

## 2015-05-05 DIAGNOSIS — Z6841 Body Mass Index (BMI) 40.0 and over, adult: Secondary | ICD-10-CM | POA: Insufficient documentation

## 2015-05-05 DIAGNOSIS — Z793 Long term (current) use of hormonal contraceptives: Secondary | ICD-10-CM | POA: Insufficient documentation

## 2015-05-05 DIAGNOSIS — R102 Pelvic and perineal pain: Secondary | ICD-10-CM

## 2015-05-05 DIAGNOSIS — Z79899 Other long term (current) drug therapy: Secondary | ICD-10-CM | POA: Diagnosis not present

## 2015-05-05 DIAGNOSIS — R1030 Lower abdominal pain, unspecified: Secondary | ICD-10-CM | POA: Diagnosis not present

## 2015-05-05 DIAGNOSIS — N200 Calculus of kidney: Secondary | ICD-10-CM | POA: Insufficient documentation

## 2015-05-05 DIAGNOSIS — E119 Type 2 diabetes mellitus without complications: Secondary | ICD-10-CM | POA: Insufficient documentation

## 2015-05-05 DIAGNOSIS — Z8719 Personal history of other diseases of the digestive system: Secondary | ICD-10-CM | POA: Diagnosis not present

## 2015-05-05 DIAGNOSIS — I10 Essential (primary) hypertension: Secondary | ICD-10-CM | POA: Insufficient documentation

## 2015-05-05 DIAGNOSIS — Z87891 Personal history of nicotine dependence: Secondary | ICD-10-CM | POA: Insufficient documentation

## 2015-05-05 DIAGNOSIS — Z7952 Long term (current) use of systemic steroids: Secondary | ICD-10-CM | POA: Diagnosis not present

## 2015-05-05 DIAGNOSIS — Z880 Allergy status to penicillin: Secondary | ICD-10-CM | POA: Diagnosis not present

## 2015-05-05 DIAGNOSIS — Z8659 Personal history of other mental and behavioral disorders: Secondary | ICD-10-CM | POA: Diagnosis not present

## 2015-05-05 DIAGNOSIS — M545 Low back pain, unspecified: Secondary | ICD-10-CM

## 2015-05-05 DIAGNOSIS — M79652 Pain in left thigh: Secondary | ICD-10-CM | POA: Diagnosis not present

## 2015-05-05 DIAGNOSIS — Z8541 Personal history of malignant neoplasm of cervix uteri: Secondary | ICD-10-CM | POA: Insufficient documentation

## 2015-05-05 DIAGNOSIS — Z862 Personal history of diseases of the blood and blood-forming organs and certain disorders involving the immune mechanism: Secondary | ICD-10-CM | POA: Diagnosis not present

## 2015-05-05 DIAGNOSIS — M79651 Pain in right thigh: Secondary | ICD-10-CM

## 2015-05-05 LAB — CBC WITH DIFFERENTIAL/PLATELET
BASOS ABS: 0 10*3/uL (ref 0.0–0.1)
Basophils Relative: 0 % (ref 0–1)
Eosinophils Absolute: 0.1 10*3/uL (ref 0.0–0.7)
Eosinophils Relative: 1 % (ref 0–5)
HEMATOCRIT: 40.5 % (ref 36.0–46.0)
Hemoglobin: 13.8 g/dL (ref 12.0–15.0)
LYMPHS ABS: 3.9 10*3/uL (ref 0.7–4.0)
LYMPHS PCT: 40 % (ref 12–46)
MCH: 29.7 pg (ref 26.0–34.0)
MCHC: 34.1 g/dL (ref 30.0–36.0)
MCV: 87.3 fL (ref 78.0–100.0)
MONO ABS: 0.7 10*3/uL (ref 0.1–1.0)
Monocytes Relative: 7 % (ref 3–12)
NEUTROS ABS: 5 10*3/uL (ref 1.7–7.7)
Neutrophils Relative %: 52 % (ref 43–77)
Platelets: 349 10*3/uL (ref 150–400)
RBC: 4.64 MIL/uL (ref 3.87–5.11)
RDW: 14.5 % (ref 11.5–15.5)
WBC: 9.7 10*3/uL (ref 4.0–10.5)

## 2015-05-05 LAB — URINALYSIS, ROUTINE W REFLEX MICROSCOPIC
Bilirubin Urine: NEGATIVE
Glucose, UA: NEGATIVE mg/dL
Ketones, ur: NEGATIVE mg/dL
Leukocytes, UA: NEGATIVE
Nitrite: NEGATIVE
PROTEIN: NEGATIVE mg/dL
Specific Gravity, Urine: 1.02 (ref 1.005–1.030)
Urobilinogen, UA: 1 mg/dL (ref 0.0–1.0)
pH: 6 (ref 5.0–8.0)

## 2015-05-05 LAB — COMPREHENSIVE METABOLIC PANEL
ALBUMIN: 3.7 g/dL (ref 3.5–5.0)
ALT: 15 U/L (ref 14–54)
ANION GAP: 9 (ref 5–15)
AST: 15 U/L (ref 15–41)
Alkaline Phosphatase: 73 U/L (ref 38–126)
BILIRUBIN TOTAL: 0.4 mg/dL (ref 0.3–1.2)
BUN: 6 mg/dL (ref 6–20)
CO2: 21 mmol/L — ABNORMAL LOW (ref 22–32)
Calcium: 9.2 mg/dL (ref 8.9–10.3)
Chloride: 107 mmol/L (ref 101–111)
Creatinine, Ser: 0.82 mg/dL (ref 0.44–1.00)
GFR calc non Af Amer: >60 mL/min (ref >60)
GLUCOSE: 97 mg/dL (ref 70–99)
POTASSIUM: 3.9 mmol/L (ref 3.5–5.1)
SODIUM: 137 mmol/L (ref 135–145)
TOTAL PROTEIN: 8 g/dL (ref 6.5–8.1)

## 2015-05-05 LAB — CBG MONITORING, ED: Glucose-Capillary: 81 mg/dL (ref 70–99)

## 2015-05-05 LAB — WET PREP, GENITAL
Clue Cells Wet Prep HPF POC: NONE SEEN
Trich, Wet Prep: NONE SEEN
YEAST WET PREP: NONE SEEN

## 2015-05-05 LAB — URINE MICROSCOPIC-ADD ON

## 2015-05-05 LAB — LIPASE, BLOOD: Lipase: 25 U/L (ref 22–51)

## 2015-05-05 MED ORDER — ACETAMINOPHEN 500 MG PO TABS
1000.0000 mg | ORAL_TABLET | Freq: Once | ORAL | Status: AC
  Administered 2015-05-05: 1000 mg via ORAL
  Filled 2015-05-05: qty 2

## 2015-05-05 MED ORDER — SODIUM CHLORIDE 0.9 % IV BOLUS (SEPSIS): 1000.0000 mL | Freq: Once | INTRAVENOUS | Status: DC

## 2015-05-05 MED ORDER — HYDROCODONE-ACETAMINOPHEN 5-325 MG PO TABS: ORAL_TABLET | ORAL | Status: DC

## 2015-05-05 MED ORDER — ONDANSETRON 4 MG PO TBDP
4.0000 mg | ORAL_TABLET | Freq: Once | ORAL | Status: AC
  Administered 2015-05-05: 4 mg via ORAL
  Filled 2015-05-05: qty 1

## 2015-05-05 MED ORDER — TRAMADOL HCL 50 MG PO TABS
50.0000 mg | ORAL_TABLET | Freq: Once | ORAL | Status: AC
  Administered 2015-05-05: 50 mg via ORAL
  Filled 2015-05-05: qty 1

## 2015-05-05 MED ORDER — MORPHINE SULFATE 4 MG/ML IJ SOLN
4.0000 mg | Freq: Once | INTRAMUSCULAR | Status: AC
  Administered 2015-05-05: 4 mg via INTRAVENOUS
  Filled 2015-05-05: qty 1

## 2015-05-05 NOTE — ED Provider Notes (Signed)
CSN: [id number]     Arrival date & time 05/05/15  1229 History   First MD Initiated Contact with Patient 05/05/15 1256     Chief Complaint  Patient presents with  . Back Pain  . Abdominal Pain     (Consider location/radiation/quality/duration/timing/severity/associated sxs/prior Treatment) HPI   Melinda Ruiz is a 34 y.o. female with past medical history significant for non-insulin-dependent diabetes, morbid obesity, kidney stones complaining of a 9 out of 10 lower abdominal pain worse on the left than the right radiating down the anterior and posterior both legs and affecting low back onset yesterday. No exacerbating or alleviating factors identified. Patient is taking no pain medication prior to arrival. Patient denies fever, chills, vomiting, abnormal vaginal discharge, dysuria, hematuria, urinary frequency, foul-smelling or concentrated urine, diarrhea, constipation, melena, hematochezia. She states that she recently got her DEPO shot last week and her periods have been irregular, is spotting today. Pt states his reading online in her field she feels that her symptoms may be related to endometriosis, has history of kidney stones but states this feels different.   Past Medical History  Diagnosis Date  . Complication of anesthesia     "Oxygen level drops"  . Kidney stones 2010  . Constipation   . Anemia     during pregancy  . 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  Has been seen at [facility]  . Depression   . Hypertension     takes meds daily  . Headache(784.0)   . Fibroid   . Diabetes mellitus without complication   . Morbid obesity with BMI of 50.0-59.9, adult    Past Surgical History  Procedure Laterality Date  . Cesarean section  2006, 2011  . Cystectomy    . Lympectomy    . Lymp glands removed--under arm bil    . Cholecystectomy  06/2011  . Tubal ligation  2011  . Multiple tooth extractions  2003    and wisdom teeth  . Pilonidial cyst  removed  1990's  . Carpal tunnel release  02/03/2012    Procedure: CARPAL TUNNEL RELEASE;  Surgeon: Meredith Pel, MD;  Location: [place];  Service: Orthopedics;  Laterality: Left;  left carpal tunnel release  . Ganglion cyst excision  02/03/2012    Procedure: REMOVAL GANGLION OF WRIST;  Surgeon: Meredith Pel, MD;  Location: [place];  Service: Orthopedics;  Laterality: Left;  left dorsal ganglion cyst excision  . Carpal tunnel release  01/11/2013    Procedure: CARPAL TUNNEL RELEASE;  Surgeon: Meredith Pel, MD;  Location: [place];  Service: Orthopedics;  Laterality: Right;  Hand Set, Small Retractors, Lead Hand   Family History  Problem Relation Age of Onset  . Kidney cancer Father   . Cancer Father   . Diabetes Mellitus II Father   . Hypertension Father   . Anesthesia problems Neg Hx   . Arthritis Mother   . Cancer Mother   . Diabetes Mellitus II Mother   . Hypertension Mother    History  Substance Use Topics  . Smoking status: Former Smoker -- 0.25 packs/day    Types: Cigarettes    Quit date: 10/01/2009  . Smokeless tobacco: Never Used  . Alcohol Use: Yes     Comment: occasionally   OB History    Gravida Para Term Preterm AB TAB SAB Ectopic Multiple Living   3 2 2  0 1 0 1 0 0 2  Review of Systems  10 systems reviewed and found to be negative, except as noted in the HPI.   Allergies  Penicillins and Oxycodone-acetaminophen  Home Medications   Prior to Admission medications   Medication Sig Start Date End Date Taking? Authorizing Provider  acetaminophen (TYLENOL) 500 MG tablet Take 500 mg by mouth every 6 (six) hours as needed for moderate pain or headache.    Yes Historical Provider, MD  desonide (DESOWEN) 0.05 % cream Apply topically 2 (two) times daily.   Yes Historical Provider, MD  ketoconazole (NIZORAL) 2 % cream Apply 1 application topically daily.  01/25/15  Yes Historical Provider, MD  loratadine (CLARITIN) 10 MG tablet Take 10 mg by mouth daily.   Yes  Historical Provider, MD  medroxyPROGESTERone (DEPO-PROVERA) 150 MG/ML injection Inject 150 mg into the muscle every 3 (three) months.   Yes Historical Provider, MD  metFORMIN (GLUCOPHAGE) 500 MG tablet Take 500 mg by mouth daily with breakfast.    Yes Historical Provider, MD  metoprolol tartrate (LOPRESSOR) 25 MG tablet Take 25 mg by mouth 2 (two) times daily.   Yes Historical Provider, MD  HYDROcodone-acetaminophen (NORCO/VICODIN) 5-325 MG per tablet Take 1-2 tablets by mouth every 6 hours as needed for pain and/or cough. 05/05/15   Shed Nixon, PA-C   BP 121/91 mmHg  Pulse 96  Temp(Src) 98.7 F (37.1 C) (Oral)  Resp 24  SpO2 98%  LMP 04/30/2015 Physical Exam  Constitutional: She is oriented to person, place, and time. She appears well-developed and well-nourished. No distress.  Obese  HENT:  Head: Normocephalic and atraumatic.  Mouth/Throat: Oropharynx is clear and moist.  Eyes: Conjunctivae and EOM are normal. Pupils are equal, round, and reactive to light.  Cardiovascular: Normal rate, regular rhythm and intact distal pulses.   Pulmonary/Chest: Effort normal. No stridor.  Abdominal: Soft. She exhibits no distension and no mass. There is tenderness. There is no rebound and no guarding.  Bowel sounds difficult to appreciate secondary to habitus. Patient is very mild tenderness palpation in the left lower quadrant with no guarding or rebound.  Genitourinary:  No CVA tenderness palpation bilaterally.  Pelvic exam a chaperoned by technician: No rashes or lesions there is very scant white, non-foul-smelling discharge in the posterior fourchette, no cervical motion tenderness, no adnexal tenderness.  Musculoskeletal: Normal range of motion.  Neurological: She is alert and oriented to person, place, and time.  Psychiatric: She has a normal mood and affect.  Nursing note and vitals reviewed.   ED Course  Procedures (including critical care time) Labs Review Labs Reviewed  WET  PREP, GENITAL - Abnormal; Notable for the following:    WBC, Wet Prep HPF POC RARE (*)    All other components within normal limits  COMPREHENSIVE METABOLIC PANEL - Abnormal; Notable for the following:    CO2 21 (*)    All other components within normal limits  URINALYSIS, ROUTINE W REFLEX MICROSCOPIC - Abnormal; Notable for the following:    APPearance CLOUDY (*)    Hgb urine dipstick LARGE (*)    All other components within normal limits  URINE MICROSCOPIC-ADD ON - Abnormal; Notable for the following:    Squamous Epithelial / LPF FEW (*)    All other components within normal limits  CBC WITH DIFFERENTIAL/PLATELET  LIPASE, BLOOD  I-STAT BETA HCG BLOOD, ED ([facility], [facility], [facility] ONLY)  CBG MONITORING, ED  GC/CHLAMYDIA PROBE AMP ([place])    Imaging Review US Transvaginal Non-ob  05/05/2015   CLINICAL DATA:  Pelvic pain and back pain since last night.  EXAM: TRANSABDOMINAL AND TRANSVAGINAL ULTRASOUND OF PELVIS  TECHNIQUE: Both transabdominal and transvaginal ultrasound examinations of the pelvis were performed. Transabdominal technique was performed for global imaging of the pelvis including uterus, ovaries, adnexal regions, and pelvic cul-de-sac. It was necessary to proceed with endovaginal exam following the transabdominal exam to visualize the uterus, endometrium and ovaries to better advantage.  COMPARISON:  None  FINDINGS: Uterus  Measurements: 10.5 cm x 5.9 cm x 6 cm. No discrete mass. Heterogeneous echogenicity.  Endometrium  Thickness: 14 mm. Heterogeneous appearance of the endometrium. No discrete endometrial mass. No endometrial fluid.  Right ovary  Measurements: 3.3 cm x 1.9 cm x 1.6 cm. Not well visualized. No gross abnormality. No adnexal mass.  Left ovary  Measurements: 2.3 cm x 2.0 cm x 2.2 cm. Normal appearance/no adnexal mass.  Other findings  No free fluid.  IMPRESSION: 1. No acute findings. 2. Prominent uterus with heterogeneous echogenicity, but without a discrete mass. 3. Prominent  endometrium, but within the normal range of thickness for this patient's age. Endometrium is also somewhat heterogeneous in echogenicity, but without a discrete mass. 4. Normal ovaries.  No adnexal masses.   Electronically Signed   By: Lajean Manes M.D.   On: 05/05/2015 15:57   US Pelvis Complete  05/05/2015   CLINICAL DATA:  Pelvic pain and back pain since last night.  EXAM: TRANSABDOMINAL AND TRANSVAGINAL ULTRASOUND OF PELVIS  TECHNIQUE: Both transabdominal and transvaginal ultrasound examinations of the pelvis were performed. Transabdominal technique was performed for global imaging of the pelvis including uterus, ovaries, adnexal regions, and pelvic cul-de-sac. It was necessary to proceed with endovaginal exam following the transabdominal exam to visualize the uterus, endometrium and ovaries to better advantage.  COMPARISON:  None  FINDINGS: Uterus  Measurements: 10.5 cm x 5.9 cm x 6 cm. No discrete mass. Heterogeneous echogenicity.  Endometrium  Thickness: 14 mm. Heterogeneous appearance of the endometrium. No discrete endometrial mass. No endometrial fluid.  Right ovary  Measurements: 3.3 cm x 1.9 cm x 1.6 cm. Not well visualized. No gross abnormality. No adnexal mass.  Left ovary  Measurements: 2.3 cm x 2.0 cm x 2.2 cm. Normal appearance/no adnexal mass.  Other findings  No free fluid.  IMPRESSION: 1. No acute findings. 2. Prominent uterus with heterogeneous echogenicity, but without a discrete mass. 3. Prominent endometrium, but within the normal range of thickness for this patient's age. Endometrium is also somewhat heterogeneous in echogenicity, but without a discrete mass. 4. Normal ovaries.  No adnexal masses.   Electronically Signed   By: Lajean Manes M.D.   On: 05/05/2015 15:57     EKG Interpretation None      MDM   Final diagnoses:  Lower abdominal pain  Bilateral low back pain without sciatica  Bilateral thigh pain    Filed Vitals:   05/05/15 1243 05/05/15 1608 05/05/15 1625   BP: 130/78 135/80 121/91  Pulse: 110  96  Temp: 98.5 F (36.9 C)  98.7 F (37.1 C)  TempSrc: Oral  Oral  Resp: 20  24  SpO2: 98%  98%    Medications  traMADol (ULTRAM) tablet 50 mg (50 mg Oral Given 05/05/15 1331)  ondansetron (ZOFRAN-ODT) disintegrating tablet 4 mg (4 mg Oral Given 05/05/15 1331)  acetaminophen (TYLENOL) tablet 1,000 mg (1,000 mg Oral Given 05/05/15 1331)  morphine 4 MG/ML injection 4 mg (4 mg Intravenous Given 05/05/15 1609)    Melinda Ruiz is a pleasant  34 y.o. female presenting with bilateral lower quadrant abdominal pain worse on left than the right with low back pain and pain to the bilateral proximal thighs. No systemic signs of infection, associated with mild nausea. Will check basic blood work, UA perform pelvic and give pain control. Patient is tolerating by mouth's.   Work with no significant abnormality, mildly lowered CO2. Urinalysis shows hemoglobin, patient is having menstrual spotting. Patient appears very comfortable, I doubt this is a kidney stone. She also states it doesn't appear similar to prior kidney stones. Pelvic exam with no significant abnormality. Pelvic ultrasound is also reassuring. Serial abdominal exams remained very benign with very mild tenderness palpation in the left lower quadrant. Patient has no diarrhea, no signs of systemic infection, I doubt this is a diverticulitis. I have asked her to follow closely with her OB/GYN at [facility] hospital. Edwena Bunde had an extensive discussion of return precautions.  Evaluation does not show pathology that would require ongoing emergent intervention or inpatient treatment. Pt is hemodynamically stable and mentating appropriately. Discussed findings and plan with patient/guardian, who agrees with care plan. All questions answered. Return precautions discussed and outpatient follow up given.   Discharge Medication List as of 05/05/2015  4:04 PM    START taking these medications   Details   HYDROcodone-acetaminophen (NORCO/VICODIN) 5-325 MG per tablet Take 1-2 tablets by mouth every 6 hours as needed for pain and/or cough., Print             [place], PA-C 05/06/15 [id number]  Serita Grit, MD 05/10/15 [id number]

## 2015-05-05 NOTE — ED Notes (Signed)
Pt given IM instead of IV morphine per [place], [place]

## 2015-05-05 NOTE — ED Notes (Signed)
Patient transported to Ultrasound 

## 2015-05-05 NOTE — Discharge Instructions (Signed)
Take vicodin for breakthrough pain, do not drink alcohol, drive, care for children or do other critical tasks while taking vicodin. ° °Please follow with your primary care doctor in the next 2 days for a check-up. They must obtain records for further management.  ° °Do not hesitate to return to the Emergency Department for any new, worsening or concerning symptoms.  ° °

## 2015-05-05 NOTE — ED Notes (Signed)
Pt reports waking up this am with left side lower back pain, radiates around to abd and down left leg. Feels "like her uterus is contracting." denies being pregnant, lmp 5/2.

## 2015-05-05 NOTE — ED Notes (Signed)
Pt taken to US

## 2015-05-07 ENCOUNTER — Telehealth: Payer: Self-pay

## 2015-05-07 LAB — GC/CHLAMYDIA PROBE AMP (~~LOC~~) NOT AT ARMC
Chlamydia: NEGATIVE
Neisseria Gonorrhea: NEGATIVE

## 2015-05-07 NOTE — Telephone Encounter (Signed)
-----   Message from Sueanne Margarita, MD sent at 05/07/2015  8:11 AM EDT ----- Please let patient know that echo was normal

## 2015-05-07 NOTE — Telephone Encounter (Signed)
Informed patient of results and verbal understanding expressed.  Patient requested information about allergic reactions/side effects to IV contrast for the ECHO. She st she went to the hospital over the weekend with L sided generalized pain and nausea but they found nothing wrong. Spoke with ECHO department. Definity is only in the system for about 7 minutes and excreted through the lungs.  When patients do experience side effects, it is usually at the time of infusion only. Main side effects are back pain, nausea, and becoming flush. Patients are kept for 30 minutes after ECHO is complete and until any symptoms at all are either gone or back at baseline (if they came in with complaints).  Relayed this information to the patient and she understands that it is highly unlikely any symptoms she experiences are not from the contrast. Patient is grateful for information and callback.  She is scheduled to see her PCP tomorrow.

## 2015-05-09 ENCOUNTER — Telehealth: Payer: Self-pay | Admitting: *Deleted

## 2015-05-09 NOTE — Telephone Encounter (Signed)
Melinda Ruiz called and left a voice message stating she went to the ER on Saturday with excrutiating pain in her lower abdomen- felt like contractions and thigh pain. States she googled her symptoms and it said it could be endometriosis. States ER said the only way to tell that is to open her up and they suggested she see her OB/GYN.  Per chart review was seen in [facility] ER and did suggest she follow up with her ob/gyn . Patient has been seen in our clinic several times.   Called Melinda Ruiz and notified her we got her message  And I will send message to our registars to schedule first available appointment, in the meantime if she has severe pain that is gyn in nature to come to [facility]. Patient voices understanding.

## 2015-05-15 ENCOUNTER — Encounter: Payer: Medicare Other | Attending: General Surgery | Admitting: Dietician

## 2015-05-15 ENCOUNTER — Encounter: Payer: Self-pay | Admitting: Dietician

## 2015-05-15 VITALS — Ht 61.5 in | Wt 300.1 lb

## 2015-05-15 DIAGNOSIS — E119 Type 2 diabetes mellitus without complications: Secondary | ICD-10-CM | POA: Diagnosis not present

## 2015-05-15 DIAGNOSIS — E669 Obesity, unspecified: Secondary | ICD-10-CM | POA: Insufficient documentation

## 2015-05-15 DIAGNOSIS — Z713 Dietary counseling and surveillance: Secondary | ICD-10-CM | POA: Diagnosis not present

## 2015-05-15 DIAGNOSIS — Z6841 Body Mass Index (BMI) 40.0 and over, adult: Secondary | ICD-10-CM | POA: Diagnosis not present

## 2015-05-15 NOTE — Progress Notes (Signed)
  6 Months Supervised Weight Loss Visit:   Pre-Operative RYGB Surgery  Medical Nutrition Therapy:  Appt start time: 0945 end time:  1000.  Primary concerns today: Supervised Weight Loss Visit. Returns with a 3 lbs weight loss. Has been eating out less, having less fried food, drinking more water, and going the gym. Skips breakfast a lot of the time.    Wt Readings from Last 3 Encounters:  05/15/15 300 lb 1.6 oz (136.124 kg)  04/30/15 303 lb 12.8 oz (137.803 kg)  04/23/15 303 lb 11.2 oz (137.757 kg)   Ht Readings from Last 3 Encounters:  05/15/15 5' 1.5" (1.562 m)  04/30/15 5\' 1"  (1.549 m)  04/17/15 5' 1.5" (1.562 m)   Body mass index is 55.79 kg/(m^2). @BMIFA @ Normalized weight-for-age data available only for age 10 to 24 years. Normalized stature-for-age data available only for age 10 to 82 years.   Patient-Centered Goals: Jordis would like to have more stable blood pressure, come off diabetes medication, and improve sleep apnea. 8 level of confidence/10 level of importance  Preferred Learning Style:   No preference indicated   Learning Readiness:   Ready   Medications: see list  Recent physical activity:  Going to the gym every day and doing the treadmill and some machines and works out for 90 minutes. Has not gone in the last week since she has been sick.   Progress Towards Goal(s):  In progress.  Nutritional Diagnosis:  [place]-3.3 Obesity related to past poor dietary habits and physical inactivity as evidenced by patient attending supervised weight loss for insurance approval of bariatric surgery.    Intervention:  Nutrition counseling provided. Plan: Check the nutrition label on protein shakes (15 g or more of protein and 5 g of less of carbohydrates). Look for sugar free drinks (5 calorie cranberry or crystal light) to try. Read the box of tea to see if there is caffeine. Work on having 3 meals per day. Continue working on chewing well. Start working waiting 30  minutes to drink after meals.  Continue working out everyday.   Teaching Method Utilized:  Visual Auditory Hands on  Barriers to learning/adherence to lifestyle change: none  Demonstrated degree of understanding via:  Teach Back   Monitoring/Evaluation:  Dietary intake, exercise, and body weight. Follow up in 1 months for 6 month supervised weight loss visit.

## 2015-05-15 NOTE — Patient Instructions (Addendum)
Check the nutrition label on protein shakes (15 g or more of protein and 5 g of less of carbohydrates). Look for sugar free drinks (5 calorie cranberry or crystal light) to try. Read the box of tea to see if there is caffeine. Work on having 3 meals per day. Continue working on chewing well. Start working waiting 30 minutes to drink after meals.  Continue working out everyday.

## 2015-05-16 ENCOUNTER — Ambulatory Visit (HOSPITAL_COMMUNITY)
Admission: RE | Admit: 2015-05-16 | Discharge: 2015-05-16 | Disposition: A | Discharge status: Home / Self Care | Payer: Medicare Other | Source: Ambulatory Visit | Attending: General Surgery | Admitting: General Surgery

## 2015-05-16 ENCOUNTER — Ambulatory Visit (HOSPITAL_COMMUNITY): Payer: Medicare Other

## 2015-05-16 DIAGNOSIS — Z9049 Acquired absence of other specified parts of digestive tract: Secondary | ICD-10-CM | POA: Diagnosis not present

## 2015-05-16 DIAGNOSIS — N938 Other specified abnormal uterine and vaginal bleeding: Secondary | ICD-10-CM | POA: Diagnosis not present

## 2015-05-16 DIAGNOSIS — R0602 Shortness of breath: Secondary | ICD-10-CM | POA: Diagnosis not present

## 2015-05-16 DIAGNOSIS — I10 Essential (primary) hypertension: Secondary | ICD-10-CM | POA: Diagnosis not present

## 2015-05-16 DIAGNOSIS — Z87891 Personal history of nicotine dependence: Secondary | ICD-10-CM | POA: Diagnosis not present

## 2015-05-16 DIAGNOSIS — Z87442 Personal history of urinary calculi: Secondary | ICD-10-CM | POA: Diagnosis not present

## 2015-05-16 DIAGNOSIS — Z6841 Body Mass Index (BMI) 40.0 and over, adult: Secondary | ICD-10-CM | POA: Insufficient documentation

## 2015-05-16 DIAGNOSIS — E119 Type 2 diabetes mellitus without complications: Secondary | ICD-10-CM | POA: Diagnosis not present

## 2015-05-23 ENCOUNTER — Ambulatory Visit: Payer: Medicare Other | Admitting: Licensed Clinical Social Worker

## 2015-05-24 ENCOUNTER — Ambulatory Visit (INDEPENDENT_AMBULATORY_CARE_PROVIDER_SITE_OTHER): Payer: Medicare Other | Admitting: Psychiatry

## 2015-05-29 ENCOUNTER — Telehealth (HOSPITAL_COMMUNITY): Payer: Self-pay

## 2015-05-29 NOTE — Telephone Encounter (Signed)
Encounter complete. 

## 2015-05-31 ENCOUNTER — Encounter (HOSPITAL_COMMUNITY)
Admission: RE | Disposition: A | Discharge status: Home / Self Care | Payer: Self-pay | Source: Ambulatory Visit | Attending: General Surgery

## 2015-05-31 ENCOUNTER — Ambulatory Visit: Payer: Medicare Other | Admitting: Cardiology

## 2015-05-31 ENCOUNTER — Ambulatory Visit (HOSPITAL_COMMUNITY)
Admission: RE | Admit: 2015-05-31 | Discharge: 2015-05-31 | Disposition: A | Discharge status: Home / Self Care | Payer: Medicare Other | Source: Ambulatory Visit | Attending: General Surgery | Admitting: General Surgery

## 2015-05-31 ENCOUNTER — Ambulatory Visit (HOSPITAL_BASED_OUTPATIENT_CLINIC_OR_DEPARTMENT_OTHER)
Admission: RE | Admit: 2015-05-31 | Discharge: 2015-05-31 | Disposition: A | Discharge status: Home / Self Care | Payer: Medicare Other | Source: Ambulatory Visit | Attending: Cardiovascular Disease | Admitting: Cardiovascular Disease

## 2015-05-31 DIAGNOSIS — R0602 Shortness of breath: Secondary | ICD-10-CM | POA: Diagnosis not present

## 2015-05-31 HISTORY — PX: BREATH TEK H PYLORI: SHX5422

## 2015-05-31 LAB — EXERCISE TOLERANCE TEST
CHL CUP RESTING HR STRESS: 98 {beats}/min
CHL CUP STRESS STAGE 1 SBP: 123 mmHg
CHL CUP STRESS STAGE 1 SPEED: 0 mph
CHL CUP STRESS STAGE 2 SPEED: 1 mph
CHL CUP STRESS STAGE 4 GRADE: 10 %
CHL CUP STRESS STAGE 4 SPEED: 1.7 mph
CHL CUP STRESS STAGE 5 GRADE: 12 %
CHL CUP STRESS STAGE 5 SPEED: 2.5 mph
CHL CUP STRESS STAGE 7 GRADE: 0 %
CHL CUP STRESS STAGE 7 HR: 105 {beats}/min
CSEPED: 3 min
CSEPEDS: 49 s
CSEPPHR: 171 {beats}/min
Estimated workload: 5.5 METS
MPHR: 188 {beats}/min
Percent HR: 87 %
Percent of predicted max HR: 90 %
RPE: 26895
Stage 1 DBP: 94 mmHg
Stage 1 Grade: 0 %
Stage 1 HR: 110 {beats}/min
Stage 2 Grade: 0 %
Stage 2 HR: 108 {beats}/min
Stage 3 Grade: 0 %
Stage 3 HR: 108 {beats}/min
Stage 3 Speed: 1 mph
Stage 4 DBP: 69 mmHg
Stage 4 HR: 162 {beats}/min
Stage 4 SBP: 124 mmHg
Stage 5 HR: 171 {beats}/min
Stage 6 Grade: 0 %
Stage 6 HR: 151 {beats}/min
Stage 6 Speed: 0 mph
Stage 7 DBP: 75 mmHg
Stage 7 SBP: 118 mmHg
Stage 7 Speed: 0 mph

## 2015-05-31 SURGERY — BREATH TEST, FOR HELICOBACTER PYLORI

## 2015-05-31 NOTE — Progress Notes (Signed)
   05/31/15 Wing  Referring MD Greer Pickerel  Time of Last PO Intake 2000  Baseline Breath At: 0808  Pranactin Given At: 0810  Post-Dose Breath At: 0825  Sample 1 2.0 %  Sample 2 2.1 %  Test Postive

## 2015-06-04 ENCOUNTER — Encounter (HOSPITAL_COMMUNITY): Payer: Self-pay | Admitting: General Surgery

## 2015-06-05 ENCOUNTER — Ambulatory Visit (INDEPENDENT_AMBULATORY_CARE_PROVIDER_SITE_OTHER): Payer: Medicare Other | Admitting: Psychiatry

## 2015-06-06 ENCOUNTER — Ambulatory Visit: Payer: Medicare Other | Admitting: Obstetrics & Gynecology

## 2015-06-13 ENCOUNTER — Ambulatory Visit: Payer: Medicare Other | Admitting: Dietician

## 2015-06-14 ENCOUNTER — Encounter: Payer: Medicare Other | Attending: General Surgery | Admitting: Dietician

## 2015-06-14 ENCOUNTER — Encounter: Payer: Self-pay | Admitting: Dietician

## 2015-06-14 VITALS — Ht 61.5 in | Wt 295.5 lb

## 2015-06-14 DIAGNOSIS — E669 Obesity, unspecified: Secondary | ICD-10-CM | POA: Diagnosis present

## 2015-06-14 DIAGNOSIS — Z713 Dietary counseling and surveillance: Secondary | ICD-10-CM | POA: Diagnosis not present

## 2015-06-14 DIAGNOSIS — E119 Type 2 diabetes mellitus without complications: Secondary | ICD-10-CM | POA: Diagnosis not present

## 2015-06-14 DIAGNOSIS — Z6841 Body Mass Index (BMI) 40.0 and over, adult: Secondary | ICD-10-CM | POA: Diagnosis not present

## 2015-06-14 NOTE — Progress Notes (Signed)
  6 Months Supervised Weight Loss Visit:   Pre-Operative RYGB Surgery  Medical Nutrition Therapy:  Appt start time: 0225 end time:  240.  Primary concerns today: Supervised Weight Loss Visit #2. Returns with a 5 lbs weight loss since last visit. Still going to the gym, has cut back on portions, having Crystal Light and working on having more water. Cut back on fried food and cut out fast food. Eating more salads. Has been eating out less, having less fried food, drinking more water, and going the gym. If she has breakfast, it's not until 10-11 AM.   Patient-Centered Goals: Melinda Ruiz would like to have more stable blood pressure, come off diabetes medication, and improve sleep apnea. 8 level of confidence/10 level of importance  Preferred Learning Style:   No preference indicated   Learning Readiness:   Ready   Medications: see list  Recent physical activity:  Going to the gym 3 x week for 90 minutes.  Progress Towards Goal(s):  In progress.  Nutritional Diagnosis:  [place]-3.3 Obesity related to past poor dietary habits and physical inactivity as evidenced by patient attending supervised weight loss for insurance approval of bariatric surgery.    Intervention:  Nutrition counseling provided. Plan: Check the nutrition label on protein shakes (15 g or more of protein and 5 g of less of carbohydrates). Bring in picture of shake next time.  Try Premier Protein.  Work on having 3 meals per day. One meal can be a shake.  Continue working on chewing well. Continue working waiting 30 minutes to drink after meals.  Continue working out 3 x week.  Drink more water. Try Mio drops to add to water.   Teaching Method Utilized:  Visual Auditory Hands on  Barriers to learning/adherence to lifestyle change: none  Demonstrated degree of understanding via:  Teach Back   Monitoring/Evaluation:  Dietary intake, exercise, and body weight. Follow up in 1 months for 6 month supervised weight loss  visit.

## 2015-06-14 NOTE — Patient Instructions (Addendum)
Check the nutrition label on protein shakes (15 g or more of protein and 5 g of less of carbohydrates). Bring in picture of shake next time.  Try Premier Protein.  Work on having 3 meals per day. One meal can be a shake.  Continue working on chewing well. Continue working waiting 30 minutes to drink after meals.  Continue working out 3 x week.  Drink more water. Try Mio drops to add to water.   After surgery, you will be avoiding starches (corn, peas, bread, pasta, rice, oatmeal, cereal, grits, crackers, chips, potatoes/sweet potatoes), fruit, and anything with sugar in it.  You will be eating yogurt, cheese, lean meat, fish, eggs, beans and non-starchy vegetables.

## 2015-06-18 ENCOUNTER — Emergency Department (HOSPITAL_COMMUNITY)
Admission: EM | Admit: 2015-06-18 | Discharge: 2015-06-18 | Disposition: A | Discharge status: Home / Self Care | Payer: Medicare Other | Attending: Emergency Medicine | Admitting: Emergency Medicine

## 2015-06-18 ENCOUNTER — Encounter (HOSPITAL_COMMUNITY): Payer: Self-pay | Admitting: *Deleted

## 2015-06-18 DIAGNOSIS — Z87891 Personal history of nicotine dependence: Secondary | ICD-10-CM | POA: Insufficient documentation

## 2015-06-18 DIAGNOSIS — Z86018 Personal history of other benign neoplasm: Secondary | ICD-10-CM | POA: Diagnosis not present

## 2015-06-18 DIAGNOSIS — Z7952 Long term (current) use of systemic steroids: Secondary | ICD-10-CM | POA: Insufficient documentation

## 2015-06-18 DIAGNOSIS — L089 Local infection of the skin and subcutaneous tissue, unspecified: Secondary | ICD-10-CM | POA: Diagnosis present

## 2015-06-18 DIAGNOSIS — I10 Essential (primary) hypertension: Secondary | ICD-10-CM | POA: Insufficient documentation

## 2015-06-18 DIAGNOSIS — Z880 Allergy status to penicillin: Secondary | ICD-10-CM | POA: Insufficient documentation

## 2015-06-18 DIAGNOSIS — Z8659 Personal history of other mental and behavioral disorders: Secondary | ICD-10-CM | POA: Diagnosis not present

## 2015-06-18 DIAGNOSIS — N61 Inflammatory disorders of breast: Secondary | ICD-10-CM | POA: Diagnosis not present

## 2015-06-18 DIAGNOSIS — Z8719 Personal history of other diseases of the digestive system: Secondary | ICD-10-CM | POA: Diagnosis not present

## 2015-06-18 DIAGNOSIS — Z87442 Personal history of urinary calculi: Secondary | ICD-10-CM | POA: Diagnosis not present

## 2015-06-18 DIAGNOSIS — Z79899 Other long term (current) drug therapy: Secondary | ICD-10-CM | POA: Insufficient documentation

## 2015-06-18 DIAGNOSIS — E119 Type 2 diabetes mellitus without complications: Secondary | ICD-10-CM | POA: Insufficient documentation

## 2015-06-18 DIAGNOSIS — N611 Abscess of the breast and nipple: Secondary | ICD-10-CM

## 2015-06-18 MED ORDER — LIDOCAINE HCL (PF) 1 % IJ SOLN
INTRAMUSCULAR | Status: AC
  Administered 2015-06-18: 5 mL
  Filled 2015-06-18: qty 5

## 2015-06-18 MED ORDER — SULFAMETHOXAZOLE-TRIMETHOPRIM 800-160 MG PO TABS: 1.0000 | ORAL_TABLET | Freq: Two times a day (BID) | ORAL | Status: AC

## 2015-06-18 MED ORDER — HYDROCODONE-ACETAMINOPHEN 5-325 MG PO TABS: 1.0000 | ORAL_TABLET | ORAL | Status: DC | PRN

## 2015-06-18 MED ORDER — LIDOCAINE-EPINEPHRINE 2 %-1:100000 IJ SOLN
20.0000 mL | Freq: Once | INTRAMUSCULAR | Status: DC
  Filled 2015-06-18: qty 20

## 2015-06-18 NOTE — Discharge Instructions (Signed)
Take Vicodin for severe pain only. No driving or operating heavy machinery while taking vicodin. This medication may cause drowsiness. Take antibiotic to completion. Continue to apply warm compresses.  Abscess An abscess is an infected area that contains a collection of pus and debris.It can occur in almost any part of the body. An abscess is also known as a furuncle or boil. CAUSES  An abscess occurs when tissue gets infected. This can occur from blockage of oil or sweat glands, infection of hair follicles, or a minor injury to the skin. As the body tries to fight the infection, pus collects in the area and creates pressure under the skin. This pressure causes pain. People with weakened immune systems have difficulty fighting infections and get certain abscesses more often.  SYMPTOMS Usually an abscess develops on the skin and becomes a painful mass that is red, warm, and tender. If the abscess forms under the skin, you may feel a moveable soft area under the skin. Some abscesses break open (rupture) on their own, but most will continue to get worse without care. The infection can spread deeper into the body and eventually into the bloodstream, causing you to feel ill.  DIAGNOSIS  Your caregiver will take your medical history and perform a physical exam. A sample of fluid may also be taken from the abscess to determine what is causing your infection. TREATMENT  Your caregiver may prescribe antibiotic medicines to fight the infection. However, taking antibiotics alone usually does not cure an abscess. Your caregiver may need to make a small cut (incision) in the abscess to drain the pus. In some cases, gauze is packed into the abscess to reduce pain and to continue draining the area. HOME CARE INSTRUCTIONS   Only take over-the-counter or prescription medicines for pain, discomfort, or fever as directed by your caregiver.  If you were prescribed antibiotics, take them as directed. Finish them even  if you start to feel better.  If gauze is used, follow your caregiver's directions for changing the gauze.  To avoid spreading the infection:  Keep your draining abscess covered with a bandage.  Wash your hands well.  Do not share personal care items, towels, or whirlpools with others.  Avoid skin contact with others.  Keep your skin and clothes clean around the abscess.  Keep all follow-up appointments as directed by your caregiver. SEEK MEDICAL CARE IF:   You have increased pain, swelling, redness, fluid drainage, or bleeding.  You have muscle aches, chills, or a general ill feeling.  You have a fever. MAKE SURE YOU:   Understand these instructions.  Will watch your condition.  Will get help right away if you are not doing well or get worse. Document Released: 09/24/2005 Document Revised: 06/15/2012 Document Reviewed: 02/27/2012 [facility] Patient Information 2015 [place], [place]. This information is not intended to replace advice given to you by your health care provider. Make sure you discuss any questions you have with your health care provider.

## 2015-06-18 NOTE — ED Provider Notes (Signed)
CSN: [id number]     Arrival date & time 06/18/15  1255 History   First MD Initiated Contact with Patient 06/18/15 1311     Chief Complaint  Patient presents with  . Recurrent Skin Infections     (Consider location/radiation/quality/duration/timing/severity/associated sxs/prior Treatment) HPI Comments: 34 y/o F c/o an abscess under her right breast x 1.5 weeks, increasing in size. States the area is tender to touch. Denies drainage. Tried epsom salt, warm compresses and meat "fat back" with no relief. Denies fevers. Hx of abscess to pubic area in the past.  The history is provided by the patient.    Past Medical History  Diagnosis Date  . Complication of anesthesia     "Oxygen level drops"  . Kidney stones 2010  . Constipation   . Anemia     during pregancy  . 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  Has been seen at [facility]  . Depression   . Hypertension     takes meds daily  . Headache(784.0)   . Fibroid   . Diabetes mellitus without complication   . Morbid obesity with BMI of 50.0-59.9, adult    Past Surgical History  Procedure Laterality Date  . Cesarean section  2006, 2011  . Cystectomy    . Lympectomy    . Lymp glands removed--under arm bil    . Cholecystectomy  06/2011  . Tubal ligation  2011  . Multiple tooth extractions  2003    and wisdom teeth  . Pilonidial cyst removed  1990's  . Carpal tunnel release  02/03/2012    Procedure: CARPAL TUNNEL RELEASE;  Surgeon: Meredith Pel, MD;  Location: [place];  Service: Orthopedics;  Laterality: Left;  left carpal tunnel release  . Ganglion cyst excision  02/03/2012    Procedure: REMOVAL GANGLION OF WRIST;  Surgeon: Meredith Pel, MD;  Location: [place];  Service: Orthopedics;  Laterality: Left;  left dorsal ganglion cyst excision  . Carpal tunnel release  01/11/2013    Procedure: CARPAL TUNNEL RELEASE;  Surgeon: Meredith Pel, MD;  Location: [place];  Service: Orthopedics;  Laterality:  Right;  Hand Set, Small Retractors, Lead Hand  . Breath tek h pylori N/A 05/31/2015    Procedure: BREATH TEK H PYLORI;  Surgeon: Greer Pickerel, MD;  Location: [facility];  Service: General;  Laterality: N/A;   Family History  Problem Relation Age of Onset  . Kidney cancer Father   . Cancer Father   . Diabetes Mellitus II Father   . Hypertension Father   . Anesthesia problems Neg Hx   . Arthritis Mother   . Cancer Mother   . Diabetes Mellitus II Mother   . Hypertension Mother    History  Substance Use Topics  . Smoking status: Former Smoker -- 0.25 packs/day    Types: Cigarettes    Quit date: 10/01/2009  . Smokeless tobacco: Never Used  . Alcohol Use: Yes     Comment: occasionally   OB History    Gravida Para Term Preterm AB TAB SAB Ectopic Multiple Living   3 2 2  0 1 0 1 0 0 2     Review of Systems  Constitutional: Negative for fever.  HENT: Negative.   Respiratory: Negative.   Cardiovascular: Negative.   Gastrointestinal: Negative for nausea and vomiting.  Genitourinary: Negative.   Skin:       + Abscess.      Allergies  Penicillins  and Oxycodone-acetaminophen  Home Medications   Prior to Admission medications   Medication Sig Start Date End Date Taking? Authorizing Provider  acetaminophen (TYLENOL) 500 MG tablet Take 500 mg by mouth every 6 (six) hours as needed for moderate pain or headache.     Historical Provider, MD  desonide (DESOWEN) 0.05 % cream Apply topically 2 (two) times daily.    Historical Provider, MD  HYDROcodone-acetaminophen (NORCO/VICODIN) 5-325 MG per tablet Take 1-2 tablets by mouth every 4 (four) hours as needed. 06/18/15   Jammie Troup M Autumne Kallio, PA-C  ketoconazole (NIZORAL) 2 % cream Apply 1 application topically daily.  01/25/15   Historical Provider, MD  loratadine (CLARITIN) 10 MG tablet Take 10 mg by mouth daily.    Historical Provider, MD  medroxyPROGESTERone (DEPO-PROVERA) 150 MG/ML injection Inject 150 mg into the muscle every 3 (three) months.     Historical Provider, MD  metFORMIN (GLUCOPHAGE) 500 MG tablet Take 500 mg by mouth daily with breakfast.     Historical Provider, MD  metoprolol tartrate (LOPRESSOR) 25 MG tablet Take 25 mg by mouth 2 (two) times daily.    Historical Provider, MD  sulfamethoxazole-trimethoprim (BACTRIM DS,SEPTRA DS) 800-160 MG per tablet Take 1 tablet by mouth 2 (two) times daily. 06/18/15 06/25/15  Darlynn Ricco M Xaviera Flaten, PA-C   BP 132/76 mmHg  Pulse 103  Temp(Src) 98.5 F (36.9 C) (Oral)  Resp 20  SpO2 96% Physical Exam  Constitutional: She is oriented to person, place, and time. She appears well-developed and well-nourished. No distress.  Obese.  HENT:  Head: Normocephalic and atraumatic.  Mouth/Throat: Oropharynx is clear and moist.  Eyes: Conjunctivae and EOM are normal.  Neck: Normal range of motion. Neck supple.  Cardiovascular: Normal rate, regular rhythm and normal heart sounds.   Pulmonary/Chest: Effort normal and breath sounds normal. No respiratory distress.  Musculoskeletal: Normal range of motion. She exhibits no edema.  Neurological: She is alert and oriented to person, place, and time. No sensory deficit.  Skin: Skin is warm and dry.  2 cm x 6 cm area of induration medial to R breast. No erythema or warmth. No drainage. Tender.  Psychiatric: She has a normal mood and affect. Her behavior is normal.  Nursing note and vitals reviewed.   ED Course  Procedures (including critical care time)  INCISION AND DRAINAGE Performed by: Lucien Mons, Clearance Coots, MD, Family practice resident Consent: Verbal consent obtained. Risks and benefits: risks, benefits and alternatives were discussed Type: abscess  Body area: R breast  Visualized under bedside ultrasound  Anesthesia: local infiltration  Incision was made with a scalpel.  Local anesthetic: lidocaine 1 % without epinephrine  Anesthetic total: 4 ml  Complexity: complex Blunt dissection to break up loculations  Drainage:  none  Patient tolerance: Patient tolerated the procedure well with no immediate complications.    Labs Review Labs Reviewed - No data to display  Imaging Review No results found.   EKG Interpretation None      MDM   Final diagnoses:  Breast abscess   Non-toxic appearing, NAD. Afebrile. VSS. Alert and appropriate for age.  Abscess without cellulitis, large in size, visualized under US guidance, no drainage with I&D. Will start on abx and have pt f/u with PCP and general surgery as it may need surgical excision. Stable for d/c. Return precautions given. Patient states understanding of treatment care plan and is agreeable.  Discussed with attending Dr. Reather Converse who also evaluated patient and agrees with plan of care.   Bailey Mech  Cletus Gash, PA-C 06/18/15 1401  Elnora Morrison, MD 06/18/15 1700

## 2015-06-18 NOTE — ED Notes (Signed)
Dressing applied under left breast to I&D site.

## 2015-06-18 NOTE — ED Notes (Signed)
Pt states that she has an abcess under her rt breast. Pt states that she has tried warm compresses with no relief.

## 2015-06-18 NOTE — ED Notes (Signed)
Pt is in stable condition upon d/c and ambulates from ED. 

## 2015-07-09 ENCOUNTER — Ambulatory Visit (INDEPENDENT_AMBULATORY_CARE_PROVIDER_SITE_OTHER): Payer: Medicare Other | Admitting: General Practice

## 2015-07-09 VITALS — BP 123/78 | HR 93 | Temp 98.2°F | Ht 61.0 in | Wt 295.4 lb

## 2015-07-09 DIAGNOSIS — Z3042 Encounter for surveillance of injectable contraceptive: Secondary | ICD-10-CM | POA: Diagnosis present

## 2015-07-09 MED ORDER — MEDROXYPROGESTERONE ACETATE 150 MG/ML IM SUSP
150.0000 mg | Freq: Once | INTRAMUSCULAR | Status: AC
  Administered 2015-07-09: 150 mg via INTRAMUSCULAR

## 2015-07-11 ENCOUNTER — Encounter: Payer: Self-pay | Admitting: Dietician

## 2015-07-11 ENCOUNTER — Encounter: Payer: Medicare Other | Attending: General Surgery | Admitting: Dietician

## 2015-07-11 VITALS — Ht 61.5 in | Wt 296.4 lb

## 2015-07-11 DIAGNOSIS — E669 Obesity, unspecified: Secondary | ICD-10-CM

## 2015-07-11 DIAGNOSIS — Z6841 Body Mass Index (BMI) 40.0 and over, adult: Secondary | ICD-10-CM | POA: Diagnosis not present

## 2015-07-11 DIAGNOSIS — Z713 Dietary counseling and surveillance: Secondary | ICD-10-CM | POA: Insufficient documentation

## 2015-07-11 DIAGNOSIS — E119 Type 2 diabetes mellitus without complications: Secondary | ICD-10-CM | POA: Insufficient documentation

## 2015-07-11 NOTE — Progress Notes (Signed)
  6 Months Supervised Weight Loss Visit:   Pre-Operative RYGB Surgery  Medical Nutrition Therapy:  Appt start time: 0940 end time:  929.  Primary concerns today: Supervised Weight Loss Visit #3. Returns with a 1 lbs weight gain since last visit. Tried [employer] shakes and saw that it has too many carbs. Is planning to try Premier today. Drinking mostly water with crystal light. Still having trouble getting in breakfast. Doing well with chewing and waiting 30 minutes to drink after eating.   Has been helping mom is grandma this week and has not been able to work out.   Patient-Centered Goals: Ahyana would like to have more stable blood pressure, come off diabetes medication, and improve sleep apnea. 8 level of confidence/10 level of importance  Preferred Learning Style:   No preference indicated   Learning Readiness:   Ready   Medications: see list  Recent physical activity:  Going to the gym 3 x week for 90 minutes, though hasn't gone in a week.  Progress Towards Goal(s):  In progress.  Nutritional Diagnosis:  [place]-3.3 Obesity related to past poor dietary habits and physical inactivity as evidenced by patient attending supervised weight loss for insurance approval of bariatric surgery.    Intervention:  Nutrition counseling provided. Plan: Try Premier Protein. Think about having it first thing in the morning (within an hour of waking up) Continue working on chewing well. Continue working waiting 30 minutes to drink after meals.  Get back to working out 3 x week.   Teaching Method Utilized:  Visual Auditory Hands on  Barriers to learning/adherence to lifestyle change: none  Demonstrated degree of understanding via:  Teach Back   Monitoring/Evaluation:  Dietary intake, exercise, and body weight. Follow up in 1 months for 6 month supervised weight loss visit.

## 2015-07-11 NOTE — Patient Instructions (Addendum)
Try Premier Protein. Think about having it first thing in the morning (within an hour of waking up) Continue working on chewing well. Continue working waiting 30 minutes to drink after meals.  Get back to working out 3 x week.   After surgery, you will be avoiding starches (corn, peas, bread, pasta, rice, oatmeal, cereal, grits, crackers, chips, potatoes/sweet potatoes), fruit, and anything with sugar in it.  You will be eating yogurt, cheese, lean meat, fish, eggs, beans and non-starchy vegetables.   Call [facility] at [phone] when surgery is scheduled to enroll in Pre-Op Class

## 2015-08-08 ENCOUNTER — Ambulatory Visit: Payer: Medicare Other | Admitting: Dietician

## 2015-08-23 ENCOUNTER — Telehealth: Payer: Self-pay | Admitting: Cardiology

## 2015-08-23 NOTE — Telephone Encounter (Signed)
Informed patient clearance was faxed to [facility] today. Patient grateful for callback.

## 2015-08-23 NOTE — Telephone Encounter (Signed)
New Message  Pt called during [employer] downtime- pt wanted to speak w/ RN following up on surgical clearance for bariatric surgery. Please call back and discuss.

## 2015-08-24 ENCOUNTER — Ambulatory Visit: Payer: Self-pay | Admitting: General Surgery

## 2015-09-07 ENCOUNTER — Ambulatory Visit: Payer: Self-pay | Admitting: General Surgery

## 2015-09-07 NOTE — H&P (Signed)
Corinn Stoltzfus 09/07/2015 3:10 PM Location: [facility] Patient #: [id number] DOB: [birth year] Single / Language: Cleophus Molt / Race: Black or African American Female History of Present Illness Randall Hiss M. Eloy Fehl MD; 09/07/2015 5:37 PM) Patient words: f/u.  The patient is a 34 year old female who presents for a pre-op visit. She comes in today to discuss laparoscopic sleeve gastrectomy. I initially met her in April for consideration for weight loss surgery. At that time she was interested in undergoing a laparoscopic gastric bypass however after giving additional research she has decided to switch to a sleeve gastrectomy. The sleeve gastrectomy appeals to her because it does not involve intestinal rerouting with the possibility of an internal hernia. Otherwise she has completed her workup and has been approved. She denies any medical changes since she was last seen. She denies any trips to the emergency room or hospital.  She is evaluated by cardiology and cleared for surgery. She has sleep apnea and is still in the process of trying to get fitted for CPAP. She states that there is been an issue with her insurance company.  Her abdominal ultrasound and upper GI were without any significant abnormalities. Her H. pylori breath test was negative. Her hemoglobin A1c was 7.7, her HDL level was 34 otherwise her initial bariatric surgery evaluation labs were within normal limits. 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 PYLORI TEST (A04.8)  Past Surgical History Gayland Curry, MD; 09/07/2015 5:37 PM) Oral Surgery Cesarean Section -  Multiple  Diagnostic Studies History Gayland Curry, MD; 09/07/2015 5:37 PM) Colonoscopy never Mammogram >3 years ago  Allergies Davy Pique Bynum, CMA; 09/07/2015 3:11 PM) Penicillamine *ASSORTED CLASSES* OxyCODONE HCl (Abuse Deter) *ANALGESICS - OPIOID*  Medication History Gayland Curry, MD; 09/07/2015 5:37 PM) Clindamycin Phosphate (1% Solution, 1 (one) External two times daily, Taken starting 04/12/2015) Active. (Apply to affected areas BID) MetFORMIN HCl (500MG Tablet, Oral) Active. Metoprolol Tartrate (25MG Tablet, Oral) Active. Loratadine (10MG Tablet, Oral) Active. Desonide (0.05% Cream, External) Active. Depo-Provera (400MG/ML Suspension, Intramuscular) Active. Medications Reconciled Zofran ODT (4MG Tablet Disperse, 1 (one) Tablet Disperse Oral every six hours, as needed, Taken starting 09/07/2015) Active. Protonix (20MG Tablet DR, 1 (one) Tablet DR Oral daily, Taken starting 09/07/2015) Active.  Social History Gayland Curry, MD; 09/07/2015 5:37 PM) Caffeine use Carbonated beverages. Illicit drug use Remotely quit drug use. Tobacco use Former smoker. Alcohol use Remotely quit alcohol use.  Family History Gayland Curry, MD; 09/07/2015 5:37 PM) Anesthetic complications Mother. Arthritis Mother. Cancer Father, Mother. Depression Mother. Diabetes Mellitus Father, Mother. Hypertension Father, Mother. Migraine Headache Sister, Son.  Pregnancy / Birth History Gayland Curry, MD; 09/07/2015 5:37 PM) Para 2 Maternal age 77-25 Contraceptive History Depo-provera, Intrauterine device. Gravida 3 Irregular periods Age at menarche 16 years.     Review of Systems Gayland Curry, MD; 09/07/2015 5:32 00) General Present- Weight Gain. Not Present- Appetite Loss, Chills, Fatigue, Fever, Night Sweats and Weight Loss. Skin Present- Dryness and New Lesions. Not Present- Change in Wart/Mole, Hives, Jaundice, Non-Healing Wounds, Rash and Ulcer. HEENT Present- Seasonal  Allergies. Not Present- Earache, Hearing Loss, Hoarseness, Nose Bleed, Oral Ulcers, Ringing in the Ears, Sinus Pain, Sore Throat, Visual Disturbances,  Wears glasses/contact lenses and Yellow Eyes. Respiratory Present- Snoring. Not Present- Bloody sputum, Chronic Cough, Difficulty Breathing and Wheezing. Breast Not Present- Breast Mass, Breast Pain, Nipple Discharge and Skin Changes. Cardiovascular Present- Difficulty Breathing Lying Down. Not Present- Chest Pain, Leg Cramps, Palpitations, Rapid Heart Rate, Shortness of Breath and Swelling of Extremities. Musculoskeletal Present- Back Pain and Joint Pain. Not Present- Joint Stiffness, Muscle Pain, Muscle Weakness and Swelling of Extremities. Psychiatric Present- Change in Sleep Pattern and Depression. Not Present- Anxiety, Bipolar, Fearful and Frequent crying. Endocrine Present- Excessive Hunger and Hair Changes. Not Present- Cold Intolerance, Heat Intolerance, Hot flashes and New Diabetes. Hematology Not Present- Easy Bruising, Excessive bleeding, Gland problems, HIV and Persistent Infections.  Vitals (Sonya Bynum CMA; 09/07/2015 3:11 PM) 09/07/2015 3:11 PM Weight: 294 lb Height: 61in Body Surface Area: 2.4 m Body Mass Index: 55.55 kg/m Temp.: 97.36F(Temporal)  Pulse: 83 (Regular)  BP: 130/80 (Sitting, Left Arm, Standard)     Physical Exam Randall Hiss M. Lylah Lantis MD; 09/07/2015 5:32 PM)  General Mental Status-Alert. General Appearance-Consistent with stated age. Hydration-Well hydrated. Voice-Normal. Note: morbidly obese, apple shaped   Head and Neck Head-normocephalic, atraumatic with no lesions or palpable masses. Trachea-midline. Thyroid Gland Characteristics - normal size and consistency.  Eye Eyeball - Bilateral-Extraocular movements intact. Sclera/Conjunctiva - Bilateral-No scleral icterus.  Chest and Lung Exam Chest and lung exam reveals -quiet, even and easy respiratory effort with no use of  accessory muscles and on auscultation, normal breath sounds, no adventitious sounds and normal vocal resonance. Inspection Chest Wall - Normal. Back - normal.  Breast - Did not examine.  Cardiovascular Cardiovascular examination reveals -normal heart sounds, regular rate and rhythm with no murmurs and normal pedal pulses bilaterally.  Abdomen Inspection Inspection of the abdomen reveals - No Hernias. Skin - Scar - Note: old c/s scar and old lap chole scars. Palpation/Percussion Palpation and Percussion of the abdomen reveal - Soft, Non Tender, No Rebound tenderness, No Rigidity (guarding) and No hepatosplenomegaly. Auscultation Auscultation of the abdomen reveals - Bowel sounds normal.  Peripheral Vascular Upper Extremity Palpation - Pulses bilaterally normal.  Neurologic Neurologic evaluation reveals -alert and oriented x 3 with no impairment of recent or remote memory. Mental Status-Normal.  Neuropsychiatric The patient's mood and affect are described as -normal. Judgment and Insight-insight is appropriate concerning matters relevant to self.  Musculoskeletal Normal Exam - Left-Upper Extremity Strength Normal and Lower Extremity Strength Normal. Normal Exam - Right-Upper Extremity Strength Normal and Lower Extremity Strength Normal.  Lymphatic Head & Neck  General Head & Neck Lymphatics: Bilateral - Description - Normal. Axillary - Did not examine. Femoral & Inguinal - Did not examine.    Assessment & Plan Randall Hiss M. Teofila Bowery MD; 09/07/2015 5:36 PM)  MORBID OBESITY WITH BODY MASS INDEX OF 50 OR HIGHER (E66.01) Impression: The patient meets weight loss surgery criteria. I think the patient would be an acceptable candidate for Laparoscopic vertical sleeve gastrectomy.  We discussed LSG. We discussed the preoperative, operative and postoperative process. Using diagrams, I explained the surgery in detail including the performance of an EGD near the end of the  surgery. We discussed the typical hospital course including a 2-3 day stay baring any complications. The patient was given educational material. I quoted the patient that most patients can lose up to 50-70% of their excess weight. We did discuss the possibility of weight regain several years after the procedure.  The risks of infection, bleeding, pain, scarring, weight regain, too little or too much weight loss, vitamin  deficiencies and need for lifelong vitamin supplementation, hair loss, need for protein supplementation, leaks, stricture, reflux, food intolerance, gallstone formation, hernia, need for reoperation, need for open surgery, injury to spleen or surrounding structures, DVT's, PE, and death again discussed with the patient and the patient expressed understanding and desires to proceed with laparoscopic vertical sleeve gastrectomy, possible open, intraoperative endoscopy.  We discussed that before and after surgery that there would be an alteration in their diet. I explained that we have put them on a diet 2 weeks before surgery. I also explained that they would be on a liquid diet for 2 weeks after surgery. We discussed that they would have to avoid certain foods after surgery. We discussed the importance of physical activity as well as compliance with our dietary and supplement recommendations and routine follow-up.  We reviewed her preoperative workup and discuss that. She was given prescription for postoperative nausea and heartburn medications. We did not give her prescription for postoperative pain medication today.  Current Plans Pt Education - EMW_preopbariatric: discussed with patient and provided information. Started Zofran ODT 4MG, 1 (one) Tablet Disperse every six hours, as needed, #20, 09/07/2015, No Refill. Started Protonix 20MG, 1 (one) Tablet DR daily, #30, 09/07/2015, No Refill. SLEEP APNEA IN ADULT (G47.33) Impression: per pcp. Encouraged her to contact her PCP to see if  they can expedite her CPAP approval  DEPRESSION, CONTROLLED (F32.9) Impression: per pcp  DIABETES MELLITUS TYPE 2 IN OBESE (E11.9) Impression: per pcp  PERIODIC HEADACHE SYNDROME, NOT INTRACTABLE (G43.C0) Impression: per pcp  ESSENTIAL HYPERTENSION WITH GOAL BLOOD PRESSURE LESS THAN 130/80 (I10) Impression: per pcp  [name]. [name], MD, FACS General, Bariatric, & Minimally Invasive Surgery [facility] [place]

## 2015-09-10 ENCOUNTER — Encounter: Payer: Medicare Other | Attending: General Surgery

## 2015-09-10 VITALS — Ht 61.5 in | Wt 298.1 lb

## 2015-09-10 DIAGNOSIS — E669 Obesity, unspecified: Secondary | ICD-10-CM | POA: Diagnosis present

## 2015-09-10 DIAGNOSIS — E119 Type 2 diabetes mellitus without complications: Secondary | ICD-10-CM | POA: Diagnosis not present

## 2015-09-10 DIAGNOSIS — Z6841 Body Mass Index (BMI) 40.0 and over, adult: Secondary | ICD-10-CM | POA: Diagnosis not present

## 2015-09-10 DIAGNOSIS — Z713 Dietary counseling and surveillance: Secondary | ICD-10-CM | POA: Diagnosis not present

## 2015-09-12 NOTE — Progress Notes (Signed)
  Pre-Operative Nutrition Class:  Appt start time: 7182   End time:  1830.  Patient was seen on 09/10/15 for Pre-Operative Bariatric Surgery Education at [facility].   Surgery date: 10/02/15 Surgery type: Sleeve gastrectomy [facility] weight at [facility]: 304 lbs on 04/17/15 Weight today: 298.1 lbs  TANITA  BODY COMP RESULTS  09/10/15   BMI (kg/m^2) N/A   Fat Mass (lbs)    Fat Free Mass (lbs)    Total Body Water (lbs)     The following the learning objectives were met by the patient during this course:  Identify Pre-Op Dietary Goals and will begin 2 weeks pre-operatively  Identify appropriate sources of fluids and proteins   State protein recommendations and appropriate sources pre and post-operatively  Identify Post-Operative Dietary Goals and will follow for 2 weeks post-operatively  Identify appropriate multivitamin and calcium sources  Describe the need for physical activity post-operatively and will follow MD recommendations  State when to call healthcare provider regarding medication questions or post-operative complications  Handouts given during class include:  Pre-Op Bariatric Surgery Diet Handout  Protein Shake Handout  Post-Op Bariatric Surgery Nutrition Handout  BELT Program Information Flyer  Support Group Information Flyer  [facility] Bariatric Supplements Price List  Follow-Up Plan: Patient will follow-up at [facility] 2 weeks post operatively for diet advancement per MD.

## 2015-09-14 ENCOUNTER — Encounter (HOSPITAL_COMMUNITY): Payer: Self-pay

## 2015-09-14 NOTE — Patient Instructions (Addendum)
Melinda Ruiz  09/14/2015   Your procedure is scheduled on: Tuesday 10/02/2015  Report to [facility]  Entrance take [place]  elevators to 3rd floor to  [place] at [place] AM.  Call this number if you have problems the morning of surgery [phone]   Remember: ONLY 1 PERSON MAY GO WITH YOU TO SHORT STAY TO GET  READY MORNING OF [place].  Do not eat food or drink liquids :After Midnight.   DO NOT TAKE ANY DIABETIC MEDICATIONS MORNING OF SURGERY!    Take these medicines the morning of surgery with A SIP OF WATER: Metoprolol                               You may not have any metal on your body including hair pins and              piercings  Do not wear jewelry, make-up, lotions, powders or perfumes, deodorant             Do not wear nail polish.  Do not shave  48 hours prior to surgery.              Men may shave face and neck.   Do not bring valuables to the hospital. Alex.  Contacts, dentures or bridgework may not be worn into surgery.  Leave suitcase in the car. After surgery it may be brought to your room.     Patients discharged the day of surgery will not be allowed to drive home.  Name and phone number of your driver:  Special Instructions: N/A              Please read over the following fact sheets you were given: _____________________________________________________________________             [facility] - Preparing for Surgery Before surgery, you can play an important role.  Because skin is not sterile, your skin needs to be as free of germs as possible.  You can reduce the number of germs on your skin by washing with CHG (chlorahexidine gluconate) soap before surgery.  CHG is an antiseptic cleaner which kills germs and bonds with the skin to continue killing germs even after washing. Please DO NOT use if you have an allergy to CHG or antibacterial soaps.  If your skin  becomes reddened/irritated stop using the CHG and inform your nurse when you arrive at Short Stay. Do not shave (including legs and underarms) for at least 48 hours prior to the first CHG shower.  You may shave your face/neck. Please follow these instructions carefully:  1.  Shower with CHG Soap the night before surgery and the  morning of Surgery.  2.  If you choose to wash your hair, wash your hair first as usual with your  normal  shampoo.  3.  After you shampoo, rinse your hair and body thoroughly to remove the  shampoo.                           4.  Use CHG as you would any other liquid soap.  You can apply chg directly  to the skin and wash  Gently with a scrungie or clean washcloth.  5.  Apply the CHG Soap to your body ONLY FROM THE NECK DOWN.   Do not use on face/ open                           Wound or open sores. Avoid contact with eyes, ears mouth and genitals (private parts).                       Wash face,  Genitals (private parts) with your normal soap.             6.  Wash thoroughly, paying special attention to the area where your surgery  will be performed.  7.  Thoroughly rinse your body with warm water from the neck down.  8.  DO NOT shower/wash with your normal soap after using and rinsing off  the CHG Soap.                9.  Pat yourself dry with a clean towel.            10.  Wear clean pajamas.            11.  Place clean sheets on your bed the night of your first shower and do not  sleep with pets. Day of Surgery : Do not apply any lotions/deodorants the morning of surgery.  Please wear clean clothes to the hospital/surgery center.  FAILURE TO FOLLOW THESE INSTRUCTIONS MAY RESULT IN THE CANCELLATION OF YOUR SURGERY PATIENT SIGNATURE_________________________________  NURSE SIGNATURE__________________________________  ________________________________________________________________________

## 2015-09-19 ENCOUNTER — Encounter (HOSPITAL_COMMUNITY)
Admission: RE | Admit: 2015-09-19 | Discharge: 2015-09-19 | Disposition: A | Discharge status: Home / Self Care | Payer: Medicare Other | Source: Ambulatory Visit | Attending: General Surgery | Admitting: General Surgery

## 2015-09-19 ENCOUNTER — Encounter (HOSPITAL_COMMUNITY): Payer: Self-pay

## 2015-09-19 DIAGNOSIS — Z01818 Encounter for other preprocedural examination: Secondary | ICD-10-CM | POA: Diagnosis present

## 2015-09-19 HISTORY — DX: Schizophrenia, unspecified: F20.9

## 2015-09-19 HISTORY — DX: Family history of other specified conditions: Z84.89

## 2015-09-19 HISTORY — DX: Bipolar disorder, unspecified: F31.9

## 2015-09-19 LAB — CBC WITH DIFFERENTIAL/PLATELET
BASOS PCT: 0 %
Basophils Absolute: 0 10*3/uL (ref 0.0–0.1)
EOS ABS: 0.1 10*3/uL (ref 0.0–0.7)
Eosinophils Relative: 1 %
HCT: 40.5 % (ref 36.0–46.0)
HEMOGLOBIN: 13.1 g/dL (ref 12.0–15.0)
Lymphocytes Relative: 36 %
Lymphs Abs: 4 10*3/uL (ref 0.7–4.0)
MCH: 28.9 pg (ref 26.0–34.0)
MCHC: 32.3 g/dL (ref 30.0–36.0)
MCV: 89.2 fL (ref 78.0–100.0)
Monocytes Absolute: 0.7 10*3/uL (ref 0.1–1.0)
Monocytes Relative: 6 %
NEUTROS PCT: 57 %
Neutro Abs: 6.3 10*3/uL (ref 1.7–7.7)
PLATELETS: 388 10*3/uL (ref 150–400)
RBC: 4.54 MIL/uL (ref 3.87–5.11)
RDW: 14.9 % (ref 11.5–15.5)
WBC: 11.2 10*3/uL — AB (ref 4.0–10.5)

## 2015-09-19 LAB — COMPREHENSIVE METABOLIC PANEL
ALBUMIN: 3.8 g/dL (ref 3.5–5.0)
ALK PHOS: 78 U/L (ref 38–126)
ALT: 16 U/L (ref 14–54)
ANION GAP: 3 — AB (ref 5–15)
AST: 18 U/L (ref 15–41)
BUN: 14 mg/dL (ref 6–20)
CALCIUM: 9.6 mg/dL (ref 8.9–10.3)
CHLORIDE: 110 mmol/L (ref 101–111)
CO2: 26 mmol/L (ref 22–32)
Creatinine, Ser: 0.86 mg/dL (ref 0.44–1.00)
GFR calc Af Amer: >60 mL/min (ref >60)
GFR calc non Af Amer: >60 mL/min (ref >60)
GLUCOSE: 81 mg/dL (ref 65–99)
Potassium: 5.2 mmol/L — ABNORMAL HIGH (ref 3.5–5.1)
SODIUM: 139 mmol/L (ref 135–145)
Total Bilirubin: 0.3 mg/dL (ref 0.3–1.2)
Total Protein: 7.9 g/dL (ref 6.5–8.1)

## 2015-10-01 MED ORDER — LEVOFLOXACIN IN D5W 750 MG/150ML IV SOLN
750.0000 mg | INTRAVENOUS | Status: AC
Start: 2015-10-02 — End: 2015-10-02
  Administered 2015-10-02: 750 mg via INTRAVENOUS
  Filled 2015-10-01 (×2): qty 150

## 2015-10-01 NOTE — Anesthesia Preprocedure Evaluation (Addendum)
Anesthesia Evaluation  Patient identified by MRN, date of birth, ID band Patient awake    Reviewed: Allergy & Precautions, NPO status , Patient's Chart, lab work & pertinent test results, reviewed documented beta blocker date and time   Airway Mallampati: III  TM Distance: >3 FB Neck ROM: Full    Dental  (+) Teeth Intact   Pulmonary former smoker,    breath sounds clear to auscultation       Cardiovascular hypertension, Pt. on medications and Pt. on home beta blockers  Rhythm:Regular Rate:Normal     Neuro/Psych  Headaches, PSYCHIATRIC DISORDERS Depression Bipolar Disorder Schizophrenia    GI/Hepatic negative GI ROS, Neg liver ROS,   Endo/Other  diabetes  Renal/GU Renal InsufficiencyRenal disease  negative genitourinary   Musculoskeletal negative musculoskeletal ROS (+)   Abdominal   Peds negative pediatric ROS (+)  Hematology   Anesthesia Other Findings   Reproductive/Obstetrics negative OB ROS                           Lab Results  Component Value Date   WBC 11.2* 09/19/2015   HGB 13.1 09/19/2015   HCT 40.5 09/19/2015   MCV 89.2 09/19/2015   PLT 388 09/19/2015   Lab Results  Component Value Date   CREATININE 0.86 09/19/2015   BUN 14 09/19/2015   NA 139 09/19/2015   K 5.2* 09/19/2015   CL 110 09/19/2015   CO2 26 09/19/2015   No results found for: INR, PROTIME  04/2015 EKG: normal EKG, normal sinus rhythm.  05/2015: Stress Test - Reviewed, no ST changes.   04/2015: Echo:  Left ventricle: The cavity size was normal. Wall thickness was normal. Systolic function was normal. The estimated ejection fraction was in the range of 50% to 55%. Wall motion was normal; there were no regional wall motion abnormalities.  Anesthesia Physical Anesthesia Plan  ASA: III  Anesthesia Plan: General   Post-op Pain Management:    Induction: Intravenous  Airway Management Planned:  Oral ETT  Additional Equipment:   Intra-op Plan:   Post-operative Plan: Extubation in OR  Informed Consent: I have reviewed the patients History and Physical, chart, labs and discussed the procedure including the risks, benefits and alternatives for the proposed anesthesia with the patient or authorized representative who has indicated his/her understanding and acceptance.   Dental advisory given  Plan Discussed with: CRNA  Anesthesia Plan Comments:         Anesthesia Quick Evaluation

## 2015-10-02 ENCOUNTER — Encounter (HOSPITAL_COMMUNITY): Payer: Self-pay | Admitting: *Deleted

## 2015-10-02 ENCOUNTER — Encounter (HOSPITAL_COMMUNITY)
Admission: RE | Disposition: A | Discharge status: Home / Self Care | Payer: Self-pay | Source: Ambulatory Visit | Attending: General Surgery

## 2015-10-02 ENCOUNTER — Inpatient Hospital Stay (HOSPITAL_COMMUNITY): Payer: Medicare Other | Admitting: Anesthesiology

## 2015-10-02 ENCOUNTER — Inpatient Hospital Stay (HOSPITAL_COMMUNITY)
Admission: RE | Admit: 2015-10-02 | Discharge: 2015-10-05 | DRG: 621 | Disposition: A | Discharge status: Home / Self Care | Payer: Medicare Other | Source: Ambulatory Visit | Attending: General Surgery | Admitting: General Surgery

## 2015-10-02 DIAGNOSIS — Z79899 Other long term (current) drug therapy: Secondary | ICD-10-CM

## 2015-10-02 DIAGNOSIS — F329 Major depressive disorder, single episode, unspecified: Secondary | ICD-10-CM | POA: Diagnosis present

## 2015-10-02 DIAGNOSIS — I10 Essential (primary) hypertension: Secondary | ICD-10-CM | POA: Diagnosis present

## 2015-10-02 DIAGNOSIS — E1169 Type 2 diabetes mellitus with other specified complication: Secondary | ICD-10-CM

## 2015-10-02 DIAGNOSIS — Z6841 Body Mass Index (BMI) 40.0 and over, adult: Secondary | ICD-10-CM

## 2015-10-02 DIAGNOSIS — E119 Type 2 diabetes mellitus without complications: Secondary | ICD-10-CM | POA: Diagnosis present

## 2015-10-02 DIAGNOSIS — R110 Nausea: Secondary | ICD-10-CM | POA: Diagnosis not present

## 2015-10-02 DIAGNOSIS — K760 Fatty (change of) liver, not elsewhere classified: Secondary | ICD-10-CM | POA: Diagnosis present

## 2015-10-02 DIAGNOSIS — Z8249 Family history of ischemic heart disease and other diseases of the circulatory system: Secondary | ICD-10-CM

## 2015-10-02 DIAGNOSIS — Z9884 Bariatric surgery status: Secondary | ICD-10-CM

## 2015-10-02 DIAGNOSIS — G4733 Obstructive sleep apnea (adult) (pediatric): Secondary | ICD-10-CM | POA: Diagnosis present

## 2015-10-02 DIAGNOSIS — Z87891 Personal history of nicotine dependence: Secondary | ICD-10-CM | POA: Diagnosis not present

## 2015-10-02 DIAGNOSIS — E786 Lipoprotein deficiency: Secondary | ICD-10-CM | POA: Diagnosis present

## 2015-10-02 DIAGNOSIS — L732 Hidradenitis suppurativa: Secondary | ICD-10-CM | POA: Diagnosis present

## 2015-10-02 DIAGNOSIS — Z833 Family history of diabetes mellitus: Secondary | ICD-10-CM | POA: Diagnosis not present

## 2015-10-02 DIAGNOSIS — E669 Obesity, unspecified: Secondary | ICD-10-CM

## 2015-10-02 DIAGNOSIS — Z01812 Encounter for preprocedural laboratory examination: Secondary | ICD-10-CM

## 2015-10-02 HISTORY — PX: LAPAROSCOPIC GASTRIC SLEEVE RESECTION: SHX5895

## 2015-10-02 HISTORY — PX: UPPER GI ENDOSCOPY: SHX6162

## 2015-10-02 LAB — PREGNANCY, URINE: Preg Test, Ur: NEGATIVE

## 2015-10-02 LAB — GLUCOSE, CAPILLARY
Glucose-Capillary: 87 mg/dL (ref 65–99)
Glucose-Capillary: 146 mg/dL — ABNORMAL HIGH (ref 65–99)

## 2015-10-02 LAB — HEMOGLOBIN AND HEMATOCRIT, BLOOD
HCT: 38.5 % (ref 36.0–46.0)
HEMOGLOBIN: 12.5 g/dL (ref 12.0–15.0)

## 2015-10-02 SURGERY — GASTRECTOMY, SLEEVE, LAPAROSCOPIC
Anesthesia: General

## 2015-10-02 MED ORDER — MIDAZOLAM HCL 2 MG/2ML IJ SOLN
INTRAMUSCULAR | Status: AC
  Filled 2015-10-02: qty 4

## 2015-10-02 MED ORDER — ACETAMINOPHEN 10 MG/ML IV SOLN
1000.0000 mg | Freq: Four times a day (QID) | INTRAVENOUS | Status: AC
  Administered 2015-10-02 – 2015-10-03 (×4): 1000 mg via INTRAVENOUS
  Filled 2015-10-02 (×3): qty 100

## 2015-10-02 MED ORDER — ROCURONIUM BROMIDE 100 MG/10ML IV SOLN
INTRAVENOUS | Status: AC
  Filled 2015-10-02: qty 1

## 2015-10-02 MED ORDER — ONDANSETRON HCL 4 MG/2ML IJ SOLN
4.0000 mg | INTRAMUSCULAR | Status: DC | PRN
  Administered 2015-10-02 – 2015-10-04 (×4): 4 mg via INTRAVENOUS
  Filled 2015-10-02 (×5): qty 2

## 2015-10-02 MED ORDER — DIPHENHYDRAMINE HCL 50 MG/ML IJ SOLN
INTRAMUSCULAR | Status: AC
  Filled 2015-10-02: qty 1

## 2015-10-02 MED ORDER — HEPARIN SODIUM (PORCINE) 5000 UNIT/ML IJ SOLN
5000.0000 [IU] | INTRAMUSCULAR | Status: AC
  Administered 2015-10-02: 5000 [IU] via SUBCUTANEOUS
  Filled 2015-10-02: qty 1

## 2015-10-02 MED ORDER — GLYCOPYRROLATE 0.2 MG/ML IJ SOLN
INTRAMUSCULAR | Status: DC | PRN
  Administered 2015-10-02: .8 mg via INTRAVENOUS

## 2015-10-02 MED ORDER — LACTATED RINGERS IV SOLN
INTRAVENOUS | Status: DC | PRN
  Administered 2015-10-02: 07:00:00 via INTRAVENOUS

## 2015-10-02 MED ORDER — PROMETHAZINE HCL 25 MG/ML IJ SOLN: 6.2500 mg | INTRAMUSCULAR | Status: DC | PRN

## 2015-10-02 MED ORDER — KCL IN DEXTROSE-NACL 20-5-0.45 MEQ/L-%-% IV SOLN
INTRAVENOUS | Status: AC
  Filled 2015-10-02: qty 1000

## 2015-10-02 MED ORDER — HYDROMORPHONE HCL 1 MG/ML IJ SOLN
INTRAMUSCULAR | Status: DC | PRN
  Administered 2015-10-02 (×4): 0.5 mg via INTRAVENOUS

## 2015-10-02 MED ORDER — HYDROMORPHONE HCL 1 MG/ML IJ SOLN
INTRAMUSCULAR | Status: AC
  Filled 2015-10-02: qty 1

## 2015-10-02 MED ORDER — HYDROMORPHONE HCL 2 MG/ML IJ SOLN
INTRAMUSCULAR | Status: AC
  Filled 2015-10-02: qty 1

## 2015-10-02 MED ORDER — NEOSTIGMINE METHYLSULFATE 10 MG/10ML IV SOLN
INTRAVENOUS | Status: DC | PRN
  Administered 2015-10-02: 5 mg via INTRAVENOUS

## 2015-10-02 MED ORDER — METOCLOPRAMIDE HCL 5 MG/ML IJ SOLN
5.0000 mg | Freq: Three times a day (TID) | INTRAMUSCULAR | Status: DC
  Administered 2015-10-02 – 2015-10-04 (×7): 5 mg via INTRAVENOUS
  Filled 2015-10-02: qty 2
  Filled 2015-10-02 (×5): qty 1
  Filled 2015-10-02: qty 2
  Filled 2015-10-02: qty 1
  Filled 2015-10-02 (×3): qty 2

## 2015-10-02 MED ORDER — PREMIER PROTEIN SHAKE
2.0000 [oz_av] | Freq: Four times a day (QID) | ORAL | Status: DC
  Administered 2015-10-04 – 2015-10-05 (×4): 2 [oz_av] via ORAL
  Filled 2015-10-02: qty 325.31

## 2015-10-02 MED ORDER — ACETAMINOPHEN 10 MG/ML IV SOLN
INTRAVENOUS | Status: AC
  Filled 2015-10-02: qty 100

## 2015-10-02 MED ORDER — PROPOFOL 10 MG/ML IV BOLUS
INTRAVENOUS | Status: DC | PRN
  Administered 2015-10-02: 200 mg via INTRAVENOUS

## 2015-10-02 MED ORDER — PANTOPRAZOLE SODIUM 40 MG IV SOLR
40.0000 mg | Freq: Every day | INTRAVENOUS | Status: DC
  Administered 2015-10-02 – 2015-10-03 (×2): 40 mg via INTRAVENOUS
  Filled 2015-10-02 (×4): qty 40

## 2015-10-02 MED ORDER — GLYCOPYRROLATE 0.2 MG/ML IJ SOLN
INTRAMUSCULAR | Status: AC
  Filled 2015-10-02: qty 4

## 2015-10-02 MED ORDER — PROMETHAZINE HCL 25 MG/ML IJ SOLN: 12.5000 mg | Freq: Four times a day (QID) | INTRAMUSCULAR | Status: DC | PRN

## 2015-10-02 MED ORDER — FENTANYL CITRATE (PF) 100 MCG/2ML IJ SOLN
12.5000 ug | INTRAMUSCULAR | Status: DC | PRN
  Administered 2015-10-02: 50 ug via INTRAVENOUS
  Administered 2015-10-02 (×2): 12.5 ug via INTRAVENOUS
  Administered 2015-10-03 (×4): 50 ug via INTRAVENOUS
  Filled 2015-10-02 (×7): qty 2

## 2015-10-02 MED ORDER — CETYLPYRIDINIUM CHLORIDE 0.05 % MT LIQD
7.0000 mL | Freq: Two times a day (BID) | OROMUCOSAL | Status: DC
  Administered 2015-10-02 – 2015-10-05 (×5): 7 mL via OROMUCOSAL

## 2015-10-02 MED ORDER — FENTANYL CITRATE (PF) 250 MCG/5ML IJ SOLN
INTRAMUSCULAR | Status: AC
  Filled 2015-10-02: qty 25

## 2015-10-02 MED ORDER — BUPIVACAINE LIPOSOME 1.3 % IJ SUSP
INTRAMUSCULAR | Status: DC | PRN
  Administered 2015-10-02: 20 mL

## 2015-10-02 MED ORDER — LABETALOL HCL 5 MG/ML IV SOLN
INTRAVENOUS | Status: DC | PRN
  Administered 2015-10-02: 5 mg via INTRAVENOUS
  Administered 2015-10-02: 2.5 mg via INTRAVENOUS

## 2015-10-02 MED ORDER — CHLORHEXIDINE GLUCONATE 4 % EX LIQD: 60.0000 mL | Freq: Once | CUTANEOUS | Status: DC

## 2015-10-02 MED ORDER — MEPERIDINE HCL 50 MG/ML IJ SOLN: 6.2500 mg | INTRAMUSCULAR | Status: DC | PRN

## 2015-10-02 MED ORDER — LIDOCAINE HCL (CARDIAC) 20 MG/ML IV SOLN
INTRAVENOUS | Status: AC
  Filled 2015-10-02: qty 5

## 2015-10-02 MED ORDER — PROPOFOL 10 MG/ML IV BOLUS
INTRAVENOUS | Status: AC
  Filled 2015-10-02: qty 20

## 2015-10-02 MED ORDER — FENTANYL CITRATE (PF) 100 MCG/2ML IJ SOLN: 25.0000 ug | INTRAMUSCULAR | Status: DC | PRN

## 2015-10-02 MED ORDER — ENOXAPARIN SODIUM 30 MG/0.3ML ~~LOC~~ SOLN
30.0000 mg | Freq: Two times a day (BID) | SUBCUTANEOUS | Status: DC
  Administered 2015-10-03 – 2015-10-05 (×5): 30 mg via SUBCUTANEOUS
  Filled 2015-10-02 (×8): qty 0.3

## 2015-10-02 MED ORDER — SODIUM CHLORIDE 0.9 % IJ SOLN
INTRAMUSCULAR | Status: AC
  Filled 2015-10-02: qty 50

## 2015-10-02 MED ORDER — LACTATED RINGERS IR SOLN
Status: DC | PRN
  Administered 2015-10-02: 1000 mL

## 2015-10-02 MED ORDER — HYDROMORPHONE HCL 1 MG/ML IJ SOLN: 0.2500 mg | INTRAMUSCULAR | Status: DC | PRN

## 2015-10-02 MED ORDER — STERILE WATER FOR IRRIGATION IR SOLN
Status: DC | PRN
  Administered 2015-10-02: 2000 mL

## 2015-10-02 MED ORDER — LIDOCAINE HCL (CARDIAC) 20 MG/ML IV SOLN
INTRAVENOUS | Status: DC | PRN
  Administered 2015-10-02: 50 mg via INTRAVENOUS

## 2015-10-02 MED ORDER — BUPIVACAINE LIPOSOME 1.3 % IJ SUSP
20.0000 mL | Freq: Once | INTRAMUSCULAR | Status: DC
  Filled 2015-10-02: qty 20

## 2015-10-02 MED ORDER — KCL IN DEXTROSE-NACL 20-5-0.45 MEQ/L-%-% IV SOLN
INTRAVENOUS | Status: DC
  Administered 2015-10-02 – 2015-10-03 (×4): via INTRAVENOUS
  Filled 2015-10-02 (×10): qty 1000

## 2015-10-02 MED ORDER — ACETAMINOPHEN 160 MG/5ML PO SOLN: 650.0000 mg | ORAL | Status: DC | PRN

## 2015-10-02 MED ORDER — MIDAZOLAM HCL 5 MG/5ML IJ SOLN
INTRAMUSCULAR | Status: DC | PRN
  Administered 2015-10-02: 2 mg via INTRAVENOUS

## 2015-10-02 MED ORDER — FENTANYL CITRATE (PF) 100 MCG/2ML IJ SOLN
INTRAMUSCULAR | Status: DC | PRN
  Administered 2015-10-02 (×3): 50 ug via INTRAVENOUS
  Administered 2015-10-02: 100 ug via INTRAVENOUS

## 2015-10-02 MED ORDER — 0.9 % SODIUM CHLORIDE (POUR BTL) OPTIME
TOPICAL | Status: DC | PRN
  Administered 2015-10-02: 1000 mL

## 2015-10-02 MED ORDER — LABETALOL HCL 5 MG/ML IV SOLN
INTRAVENOUS | Status: AC
  Filled 2015-10-02: qty 4

## 2015-10-02 MED ORDER — LACTATED RINGERS IV SOLN: INTRAVENOUS | Status: DC

## 2015-10-02 MED ORDER — SODIUM CHLORIDE 0.9 % IJ SOLN
INTRAMUSCULAR | Status: DC | PRN
  Administered 2015-10-02: 50 mL via INTRAVENOUS

## 2015-10-02 MED ORDER — NEOSTIGMINE METHYLSULFATE 10 MG/10ML IV SOLN
INTRAVENOUS | Status: AC
  Filled 2015-10-02: qty 1

## 2015-10-02 MED ORDER — DIPHENHYDRAMINE HCL 50 MG/ML IJ SOLN
12.5000 mg | Freq: Three times a day (TID) | INTRAMUSCULAR | Status: DC | PRN
  Administered 2015-10-02: 12.5 mg via INTRAVENOUS

## 2015-10-02 MED ORDER — METHOCARBAMOL 1000 MG/10ML IJ SOLN
500.0000 mg | Freq: Three times a day (TID) | INTRAVENOUS | Status: AC
  Administered 2015-10-02 – 2015-10-04 (×7): 500 mg via INTRAVENOUS
  Filled 2015-10-02 (×9): qty 5

## 2015-10-02 MED ORDER — ONDANSETRON HCL 4 MG/2ML IJ SOLN
INTRAMUSCULAR | Status: DC | PRN
  Administered 2015-10-02: 4 mg via INTRAVENOUS

## 2015-10-02 MED ORDER — ONDANSETRON HCL 4 MG/2ML IJ SOLN
INTRAMUSCULAR | Status: AC
  Filled 2015-10-02: qty 2

## 2015-10-02 MED ORDER — ROCURONIUM BROMIDE 100 MG/10ML IV SOLN
INTRAVENOUS | Status: DC | PRN
  Administered 2015-10-02: 30 mg via INTRAVENOUS
  Administered 2015-10-02: 10 mg via INTRAVENOUS
  Administered 2015-10-02: 20 mg via INTRAVENOUS

## 2015-10-02 SURGICAL SUPPLY — 60 items
APPLICATOR COTTON TIP 6IN STRL (MISCELLANEOUS) IMPLANT
APPLIER CLIP ROT 10 11.4 M/L (STAPLE) ×6
BLADE SURG SZ11 CARB STEEL (BLADE) ×3 IMPLANT
CABLE HIGH FREQUENCY MONO STRZ (ELECTRODE) ×3 IMPLANT
CHLORAPREP W/TINT 26ML (MISCELLANEOUS) ×6 IMPLANT
CLIP APPLIE ROT 10 11.4 M/L (STAPLE) ×4 IMPLANT
COVER SURGICAL LIGHT HANDLE (MISCELLANEOUS) ×3 IMPLANT
DERMABOND ADVANCED (GAUZE/BANDAGES/DRESSINGS) ×1
DERMABOND ADVANCED .7 DNX12 (GAUZE/BANDAGES/DRESSINGS) ×2 IMPLANT
DEVICE PMI PUNCTURE CLOSURE (MISCELLANEOUS) ×3 IMPLANT
DEVICE SUT QUICK LOAD TK 5 (STAPLE) IMPLANT
DEVICE SUT TI-KNOT TK 5X26 (MISCELLANEOUS) IMPLANT
DEVICE SUTURE ENDOST 10MM (ENDOMECHANICALS) IMPLANT
DEVICE TROCAR PUNCTURE CLOSURE (ENDOMECHANICALS) ×3 IMPLANT
DRAPE CAMERA CLOSED 9X96 (DRAPES) ×3 IMPLANT
DRAPE UTILITY XL STRL (DRAPES) ×6 IMPLANT
ELECT REM PT RETURN 9FT ADLT (ELECTROSURGICAL) ×3
ELECTRODE REM PT RTRN 9FT ADLT (ELECTROSURGICAL) ×2 IMPLANT
GAUZE SPONGE 4X4 12PLY STRL (GAUZE/BANDAGES/DRESSINGS) IMPLANT
GLOVE BIOGEL M STRL SZ7.5 (GLOVE) ×3 IMPLANT
GOWN STRL REUS W/TWL XL LVL3 (GOWN DISPOSABLE) ×12 IMPLANT
HOVERMATT SINGLE USE (MISCELLANEOUS) ×3 IMPLANT
KIT BASIN OR (CUSTOM PROCEDURE TRAY) ×3 IMPLANT
NEEDLE SPNL 22GX3.5 QUINCKE BK (NEEDLE) ×3 IMPLANT
PACK UNIVERSAL I (CUSTOM PROCEDURE TRAY) ×3 IMPLANT
PEN SKIN MARKING BROAD (MISCELLANEOUS) ×3 IMPLANT
RELOAD STAPLER BLUE 60MM (STAPLE) IMPLANT
RELOAD STAPLER GOLD 60MM (STAPLE) ×2 IMPLANT
RELOAD STAPLER GREEN 60MM (STAPLE) ×6 IMPLANT
SCISSORS LAP 5X45 EPIX DISP (ENDOMECHANICALS) ×3 IMPLANT
SEALANT SURGICAL APPL DUAL CAN (MISCELLANEOUS) IMPLANT
SET IRRIG TUBING LAPAROSCOPIC (IRRIGATION / IRRIGATOR) ×3 IMPLANT
SHEARS CURVED HARMONIC AC 45CM (MISCELLANEOUS) ×3 IMPLANT
SLEEVE ADV FIXATION 5X100MM (TROCAR) ×6 IMPLANT
SLEEVE GASTRECTOMY 36FR VISIGI (MISCELLANEOUS) ×3 IMPLANT
SLEEVE XCEL OPT CAN 5 100 (ENDOMECHANICALS) IMPLANT
SOLUTION ANTI FOG 6CC (MISCELLANEOUS) ×3 IMPLANT
SPONGE LAP 18X18 X RAY DECT (DISPOSABLE) ×3 IMPLANT
STAPLER ECHELON BIOABSB 60 FLE (MISCELLANEOUS) ×12 IMPLANT
STAPLER ECHELON LONG 60 440 (INSTRUMENTS) ×3 IMPLANT
STAPLER RELOAD BLUE 60MM (STAPLE)
STAPLER RELOAD GOLD 60MM (STAPLE) ×3
STAPLER RELOAD GREEN 60MM (STAPLE) ×9
SUT MNCRL AB 4-0 PS2 18 (SUTURE) ×3 IMPLANT
SUT SURGIDAC NAB ES-9 0 48 120 (SUTURE) IMPLANT
SUT VICRYL 0 TIES 12 18 (SUTURE) ×3 IMPLANT
SYR 10ML ECCENTRIC (SYRINGE) ×3 IMPLANT
SYR 20CC LL (SYRINGE) ×3 IMPLANT
SYR 50ML LL SCALE MARK (SYRINGE) ×3 IMPLANT
TOWEL OR 17X26 10 PK STRL BLUE (TOWEL DISPOSABLE) ×3 IMPLANT
TOWEL OR NON WOVEN STRL DISP B (DISPOSABLE) ×3 IMPLANT
TRAY FOLEY W/METER SILVER 14FR (SET/KITS/TRAYS/PACK) IMPLANT
TRAY FOLEY W/METER SILVER 16FR (SET/KITS/TRAYS/PACK) IMPLANT
TROCAR ADV FIXATION 12X100MM (TROCAR) ×3 IMPLANT
TROCAR ADV FIXATION 5X100MM (TROCAR) ×3 IMPLANT
TROCAR BLADELESS 15MM (ENDOMECHANICALS) ×3 IMPLANT
TROCAR BLADELESS OPT 5 100 (ENDOMECHANICALS) ×3 IMPLANT
TUBING CONNECTING 10 (TUBING) ×6 IMPLANT
TUBING ENDO SMARTCAP (MISCELLANEOUS) ×3 IMPLANT
TUBING FILTER THERMOFLATOR (ELECTROSURGICAL) ×3 IMPLANT

## 2015-10-02 NOTE — Op Note (Signed)
10/02/2015 DAI APEL 03-Oct-1981 [id number]   PRE-OPERATIVE DIAGNOSIS:     Morbid obesity with BMI of 50.0-59.9, adult ([place])   Hypertension, benign   Diabetes mellitus type 2 in obese (HCC)   Low HDL (under 40)   OSA (obstructive sleep apnea)  Fatty liver disease  POST-OPERATIVE DIAGNOSIS:  same  PROCEDURE:  Procedure(s): LAPAROSCOPIC SLEEVE GASTRECTOMY UPPER GI ENDOSCOPY  SURGEON:  Surgeon(s): Gayland Curry, MD FACS FASMBS  ASSISTANTS: Johnathan Hausen MD FACS  ANESTHESIA:   general  DRAINS: none   BOUGIE: 25 fr ViSiGi  LOCAL MEDICATIONS USED:  MARCAINE + Exparel  SPECIMEN:  Source of Specimen:  Greater curvature of stomach  DISPOSITION OF SPECIMEN:  PATHOLOGY  COUNTS:  YES  INDICATION FOR PROCEDURE: This is a very pleasant 34 year old morbidly obese female who has had unsuccessful attempts for sustained weight loss. She presents today for a planned laparoscopic sleeve gastrectomy with upper endoscopy. We have discussed the risk and benefits of the procedure extensively preoperatively. Please see my separate notes.  PROCEDURE: After obtaining informed consent and receiving 5000 units of subcutaneous heparin, the patient was brought to the operating room at [facility] and placed supine on the operating room table. General endotracheal anesthesia was established. Sequential compression devices were placed. A orogastric tube was placed. The patient's abdomen was prepped and draped in the usual standard surgical fashion. She received preoperative IV antibiotics. A surgical timeout was performed.  Access to the abdomen was achieved using a 5 mm 0 laparoscope thru a 5 mm trocar In the left upper Quadrant 2 fingerbreadths below the left subcostal margin using the Optiview technique. Pneumoperitoneum was smoothly established up to 15 mm of mercury. The laparoscope was advanced and the abdominal cavity was surveilled. The patient was then placed in reverse  Trendelenburg. There was no evidence of a hiatal hernia on laparoscopy - no gap in the left and right crus anteriorly.  A 5 mm trocar was placed slightly above and to the left of the umbilicus under direct visualization.  The [facility] liver retractor was placed under the left lobe of the liver through a 5 mm trocar incision site in the subxiphoid position. A 5 mm trocar was placed in the lateral right upper quadrant along with a 15 mm trocar in the mid right abdomen. A final 5 mm trocar was placed in the lateral LUQ.  All under direct visualization after local had been infiltrated. She had a thick abdominal wall and a fatty liver.  The stomach was inspected. It was completely decompressed and the orogastric tube was removed.  There was no anterior dimple that was obviously visible. Her preop UGI showed no hiatal hernia.   We identified the pylorus and measured 6 cm proximal to the pylorus and identified an area of where we would start taking down the short gastric vessels. Harmonic scalpel was used to take down the short gastric vessels along the greater curvature of the stomach. We were able to enter the lesser sac. We continued to march along the greater curvature of the stomach taking down the short gastrics. As we approached the gastrosplenic ligament we took care in this area not to injure the spleen. We were able to take down the entire gastrosplenic ligament. We then mobilized the fundus away from the left crus of diaphragm. There were not any significant posterior gastric avascular attachments. This left the stomach completely mobilized. No vessels had been taken down along the lesser curvature of the stomach.  We  then reidentified the pylorus. A 36Fr ViSiGi was then placed in the oropharynx and advanced down into the stomach and placed in the distal antrum and positioned along the lesser curvature. It was placed under suction which secured the 36Fr ViSiGi in place along the lesser curve. Then using  the [employer] echelon 60 mm stapler with a green load with Seamguard, I placed a stapler along the antrum approximately 5 cm from the pylorus. The stapler was angled so that there is ample room at the angularis incisura. I then fired the first staple load after inspecting it posteriorly to ensure adequate space both anteriorly and posteriorly. At this point I still was not completely past the angularis so with another green load with Seamguard, I placed the stapler in position just inside the prior stapleline. We then rotated the stomach to insure that there was adequate anteriorly as well as posteriorly. The stapler was then fired. Because of projected staple line placement, I upsized the camera trocar to a 30mm trocar and used this trocar to fire the remaining staple cartridges. The camera was placed thru the optiview trocar. I used another 52mm green cartridge with seamguard.  At this point I started using 60 mm gold load staple cartridges with Seamguard. The echelon stapler was then repositioned with a 60 mm gold load with Seamguard and we continued to march up along the [place]. My assistant was holding traction along the greater curvature stomach along the cauterized short gastric vessels ensuring that the stomach was symmetrically retracted. Prior to each firing of the staple, we rotated the stomach to ensure that there is adequate stomach left.  As we approached the fundus, I used 60 mm blue cartridge with Seamguard aiming slightly lateral to the esophageal fat pad. Although the staples on this fire had completely gone thru the last part of the stomach it had not completely cut it. Therefore 1 additional 60 blue load was used to free the remaining stomach. The sleeve was inspected. There is no evidence of cork screw. The staple line appeared hemostatic. The CRNA inflated the ViSiGi to the green zone and the upper abdomen was flooded with saline. There were no bubbles. The sleeve was decompressed and the ViSiGi  removed. My assistant scrubbed out and performed an upper endoscopy. The sleeve easily distended with air and the scope was easily advanced to the pylorus. There is no evidence of internal bleeding or cork screwing. There was no narrowing at the angularis. There is no evidence of bubbles. Please see his operative note for further details. The gastric sleeve was decompressed and the endoscope was removed.  The greater curvature the stomach was grasped with a laparoscopic grasper and removed from the 15 mm trocar site.  The liver retractor was removed. I then closed the 15 mm trocar site with 1 interrupted 0 Vicryl sutures through the fascia using the endoclose. The closure was viewed laparoscopically and it was airtight. 70 cc of Exparel was then infiltrated in the preperitoneal spaces around the trocar sites. Pneumoperitoneum was released. All trocar sites were closed with a 4-0 Monocryl in a subcuticular fashion followed by the application of Dermabond. The patient was extubated and taken to the recovery room in stable condition. All needle, instrument, and sponge counts were correct x2. There are no immediate complications  (3) 60 mm green with Seamguard (1) 60 mm gold with seamguard (2) 60 mm blue with 1 seamguard  PLAN OF CARE: Admit to inpatient   PATIENT DISPOSITION:  PACU -  hemodynamically stable.   Delay start of Pharmacological VTE agent (>24hrs) due to surgical blood loss or risk of bleeding:  no  [name]. [name], MD, FACS FASMBS General, Bariatric, & Minimally Invasive Surgery [facility], [place]

## 2015-10-02 NOTE — Op Note (Signed)
Melinda Ruiz [id number] 26-Nov-1981 10/02/2015  Preoperative diagnosis: sleeve gastrectomy in progress  Postoperative diagnosis: Same   Procedure: Upper endoscopy   Surgeon: Catalina Antigua B. Hassell Done  M.D., FACS   Anesthesia: Gen.   Indications for procedure: This patient was undergoing a sleeve gastrectomy and the EGD was performed to look at geometry, rule out leaks and bleeds.    Description of procedure: The endoscopy was placed in the mouth and into the oropharynx and under endoscopic vision it was advanced to the esophagogastric junction.  The pouch was insufflated and was nice and cylindrical.  No bleeding or leaks were noted.  The EG junction was at 43 cm.  .   No bleeding or leaks were detected.  The scope was withdrawn without difficulty.     Matt B. Hassell Done, MD, FACS General, Bariatric, & Minimally Invasive Surgery [facility], [place]

## 2015-10-02 NOTE — Progress Notes (Signed)
Utilization review completed.  

## 2015-10-02 NOTE — Anesthesia Procedure Notes (Signed)
Procedure Name: Intubation Date/Time: 10/02/2015 7:28 AM Performed by: Anne Fu Pre-anesthesia Checklist: Patient identified, Emergency Drugs available, Suction available, Patient being monitored and Timeout performed Patient Re-evaluated:Patient Re-evaluated prior to inductionOxygen Delivery Method: Circle system utilized Preoxygenation: Pre-oxygenation with 100% oxygen Intubation Type: IV induction Ventilation: Mask ventilation without difficulty Laryngoscope Size: Mac and 4 Grade View: Grade II Tube type: Oral Tube size: 7.5 mm Number of attempts: 1 Airway Equipment and Method: Stylet Placement Confirmation: ETT inserted through vocal cords under direct vision,  positive ETCO2,  CO2 detector and breath sounds checked- equal and bilateral Secured at: 21 cm Tube secured with: Tape Dental Injury: Teeth and Oropharynx as per pre-operative assessment  Comments: Patient placed on intubation wedge prior to induction, noted Grade II view positive ETCO2 noted with equal rise and fall of chest.

## 2015-10-02 NOTE — Anesthesia Postprocedure Evaluation (Signed)
  Anesthesia Post-op Note  Patient: Melinda Ruiz  Procedure(s) Performed: Procedure(s): LAPAROSCOPIC GASTRIC SLEEVE RESECTION (N/A) UPPER GI ENDOSCOPY  Patient Location: PACU  Anesthesia Type:General  Level of Consciousness: awake, alert  and oriented  Airway and Oxygen Therapy: Patient Spontanous Breathing  Post-op Pain: mild  Post-op Assessment: Post-op Vital signs reviewed and Patient's Cardiovascular Status Stable              Post-op Vital Signs: Reviewed and stable  Last Vitals:  Filed Vitals:   10/02/15 1252  BP: 152/75  Pulse: 82  Temp: 37 C  Resp: 20    Complications: No apparent anesthesia complications

## 2015-10-02 NOTE — Interval H&P Note (Signed)
History and Physical Interval Note:  10/02/2015 7:11 AM  Melinda Ruiz  has presented today for surgery, with the diagnosis of Morbid Obesity  The various methods of treatment have been discussed with the patient and family. After consideration of risks, benefits and other options for treatment, the patient has consented to  Procedure(s): LAPAROSCOPIC GASTRIC SLEEVE RESECTION (N/A) as a surgical intervention .  The patient's history has been reviewed, patient examined, no change in status, stable for surgery.  I have reviewed the patient's chart and labs.  Questions were answered to the patient's satisfaction.    With upper endoscopy  [name]. [name], MD, [place], Bariatric, & Minimally Invasive Surgery [facility], [place]   [facility] M

## 2015-10-02 NOTE — H&P (Signed)
Makynli Stills 09/07/2015 3:10 PM Location: [facility] Patient #: [id number] DOB: 09/23/1981 Single / Language: Cleophus Molt / Race: Black or African American Female  History of Present Illness Randall Hiss M. Arturo Sofranko MD; 09/07/2015 5:37 PM) Patient words: f/u.  The patient is a 34 year old female who presents for a pre-op visit. She comes in today to discuss laparoscopic sleeve gastrectomy. I initially met her in April for consideration for weight loss surgery. At that time she was interested in undergoing a laparoscopic gastric bypass however after giving additional research she has decided to switch to a sleeve gastrectomy. The sleeve gastrectomy appeals to her because it does not involve intestinal rerouting with the possibility of an internal hernia. Otherwise she has completed her workup and has been approved. She denies any medical changes since she was last seen. She denies any trips to the emergency room or hospital.  She is evaluated by cardiology and cleared for surgery. She has sleep apnea and is still in the process of trying to get fitted for CPAP. She states that there is been an issue with her insurance company.  Her abdominal ultrasound and upper GI were without any significant abnormalities. Her H. pylori breath test was negative. Her hemoglobin A1c was 7.7, her HDL level was 34 otherwise her initial bariatric surgery evaluation labs were within normal limits.   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 PYLORI TEST (A04.8)  Past Surgical History Gayland Curry, MD; 09/07/2015 5:37 PM) Oral Surgery Cesarean Section -  Multiple  Diagnostic Studies History Gayland Curry, MD; 09/07/2015 5:37 PM) Colonoscopy never Mammogram >3 years ago  Allergies Davy Pique Bynum, CMA; 09/07/2015 3:11 PM) Penicillamine *ASSORTED CLASSES* OxyCODONE HCl (Abuse Deter) *ANALGESICS - OPIOID*  Medication History Gayland Curry, MD; 09/07/2015 5:37 PM) Clindamycin Phosphate (1% Solution, 1 (one) External two times daily, Taken starting 04/12/2015) Active. (Apply to affected areas BID) MetFORMIN HCl (500MG Tablet, Oral) Active. Metoprolol Tartrate (25MG Tablet, Oral) Active. Loratadine (10MG Tablet, Oral) Active. Desonide (0.05% Cream, External) Active. Depo-Provera (400MG/ML Suspension, Intramuscular) Active. Medications Reconciled Zofran ODT (4MG Tablet Disperse, 1 (one) Tablet Disperse Oral every six hours, as needed, Taken starting 09/07/2015) Active. Protonix (20MG Tablet DR, 1 (one) Tablet DR Oral daily, Taken starting 09/07/2015) Active.  Social History Gayland Curry, MD; 09/07/2015 5:37 PM) Caffeine use Carbonated beverages. Illicit drug use Remotely quit drug use. Tobacco use Former smoker. Alcohol use Remotely quit alcohol use.  Family History Gayland Curry, MD; 09/07/2015 5:37 PM) Anesthetic complications Mother. Arthritis Mother. Cancer Father, Mother. Depression Mother. Diabetes Mellitus Father, Mother. Hypertension Father, Mother. Migraine Headache Sister, Son.  Pregnancy / Birth History Gayland Curry, MD; 09/07/2015 5:37 PM) Para 2 Maternal age 40-25 Contraceptive History Depo-provera, Intrauterine device. Gravida 3 Irregular periods Age at menarche 41 years.  Review of Systems Gayland Curry, MD; 09/07/2015 5:32 00) General Present- Weight Gain. Not Present- Appetite Loss, Chills, Fatigue, Fever, Night Sweats and Weight Loss. Skin Present- Dryness and New Lesions. Not Present- Change in Wart/Mole, Hives, Jaundice, Non-Healing Wounds, Rash and Ulcer. HEENT Present- Seasonal  Allergies. Not Present- Earache, Hearing Loss, Hoarseness, Nose Bleed, Oral Ulcers, Ringing in the Ears, Sinus Pain, Sore Throat, Visual Disturbances,  Wears glasses/contact lenses and Yellow Eyes. Respiratory Present- Snoring. Not Present- Bloody sputum, Chronic Cough, Difficulty Breathing and Wheezing. Breast Not Present- Breast Mass, Breast Pain, Nipple Discharge and Skin Changes. Cardiovascular Present- Difficulty Breathing Lying Down. Not Present- Chest Pain, Leg Cramps, Palpitations, Rapid Heart Rate, Shortness of Breath and Swelling of Extremities. Musculoskeletal Present- Back Pain and Joint Pain. Not Present- Joint Stiffness, Muscle Pain, Muscle Weakness and Swelling of Extremities. Psychiatric Present- Change in Sleep Pattern and Depression. Not Present- Anxiety, Bipolar, Fearful and Frequent crying. Endocrine Present- Excessive Hunger and Hair Changes. Not Present- Cold Intolerance, Heat Intolerance, Hot flashes and New Diabetes. Hematology Not Present- Easy Bruising, Excessive bleeding, Gland problems, HIV and Persistent Infections.   Vitals (Sonya Bynum CMA; 09/07/2015 3:11 PM) 09/07/2015 3:11 PM Weight: 294 lb Height: 61in Body Surface Area: 2.4 m Body Mass Index: 55.55 kg/m Temp.: 97.21F(Temporal)  Pulse: 83 (Regular)  BP: 130/80 (Sitting, Left Arm, Standard)    Physical Exam Randall Hiss M. Reice Bienvenue MD; 09/07/2015 5:32 PM) General Mental Status-Alert. General Appearance-Consistent with stated age. Hydration-Well hydrated. Voice-Normal. Note: morbidly obese, apple shaped   Head and Neck Head-normocephalic, atraumatic with no lesions or palpable masses. Trachea-midline. Thyroid Gland Characteristics - normal size and consistency.  Eye Eyeball - Bilateral-Extraocular movements intact. Sclera/Conjunctiva - Bilateral-No scleral icterus.  Chest and Lung Exam Chest and lung exam reveals -quiet, even and easy respiratory effort with no use of accessory  muscles and on auscultation, normal breath sounds, no adventitious sounds and normal vocal resonance. Inspection Chest Wall - Normal. Back - normal.  Breast - Did not examine.  Cardiovascular Cardiovascular examination reveals -normal heart sounds, regular rate and rhythm with no murmurs and normal pedal pulses bilaterally.  Abdomen Inspection Inspection of the abdomen reveals - No Hernias. Skin - Scar - Note: old c/s scar and old lap chole scars. Palpation/Percussion Palpation and Percussion of the abdomen reveal - Soft, Non Tender, No Rebound tenderness, No Rigidity (guarding) and No hepatosplenomegaly. Auscultation Auscultation of the abdomen reveals - Bowel sounds normal.  Peripheral Vascular Upper Extremity Palpation - Pulses bilaterally normal.  Neurologic Neurologic evaluation reveals -alert and oriented x 3 with no impairment of recent or remote memory. Mental Status-Normal.  Neuropsychiatric The patient's mood and affect are described as -normal. Judgment and Insight-insight is appropriate concerning matters relevant to self.  Musculoskeletal Normal Exam - Left-Upper Extremity Strength Normal and Lower Extremity Strength Normal. Normal Exam - Right-Upper Extremity Strength Normal and Lower Extremity Strength Normal.  Lymphatic Head & Neck  General Head & Neck Lymphatics: Bilateral - Description - Normal. Axillary - Did not examine. Femoral & Inguinal - Did not examine.    Assessment & Plan Randall Hiss M. Kamryn Gauthier MD; 09/07/2015 5:36 PM) MORBID OBESITY WITH BODY MASS INDEX OF 50 OR HIGHER (E66.01) Impression: The patient meets weight loss surgery criteria. I think the patient would be an acceptable candidate for Laparoscopic vertical sleeve gastrectomy.  We discussed LSG. We discussed the preoperative, operative and postoperative process. Using diagrams, I explained the surgery in detail including the performance of an EGD near the end of the surgery. We  discussed the typical hospital course including a 2-3 day stay baring any complications. The patient was given educational material. I quoted the patient that most patients can lose up to 50-70% of their excess weight. We did discuss the possibility of weight regain several years after the procedure.  The risks of infection, bleeding, pain, scarring, weight regain, too little or too much weight loss, vitamin deficiencies and  need for lifelong vitamin supplementation, hair loss, need for protein supplementation, leaks, stricture, reflux, food intolerance, gallstone formation, hernia, need for reoperation, need for open surgery, injury to spleen or surrounding structures, DVT's, PE, and death again discussed with the patient and the patient expressed understanding and desires to proceed with laparoscopic vertical sleeve gastrectomy, possible open, intraoperative endoscopy.  We discussed that before and after surgery that there would be an alteration in their diet. I explained that we have put them on a diet 2 weeks before surgery. I also explained that they would be on a liquid diet for 2 weeks after surgery. We discussed that they would have to avoid certain foods after surgery. We discussed the importance of physical activity as well as compliance with our dietary and supplement recommendations and routine follow-up.  We reviewed her preoperative workup and discuss that. She was given prescription for postoperative nausea and heartburn medications. We did not give her prescription for postoperative pain medication today. Current Plans  Pt Education - EMW_preopbariatric: discussed with patient and provided information. Started Zofran ODT 4MG, 1 (one) Tablet Disperse every six hours, as needed, #20, 09/07/2015, No Refill. Started Protonix 20MG, 1 (one) Tablet DR daily, #30, 09/07/2015, No Refill. SLEEP APNEA IN ADULT (G47.33) Impression: per pcp. 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 [name], MD, FACS General, Bariatric, & Minimally Invasive Surgery [facility], [place]

## 2015-10-02 NOTE — Progress Notes (Signed)
RT placed patient on CPAP. Patient setting is auto 6-20 cmH20. Sterile water was added to water chamber for humidification. 2 liters bleed into tubing. Patient is tolerating well. RT will continue to monitor.

## 2015-10-02 NOTE — Transfer of Care (Signed)
Immediate Anesthesia Transfer of Care Note  Patient: Melinda Ruiz  Procedure(s) Performed: Procedure(s): LAPAROSCOPIC GASTRIC SLEEVE RESECTION (N/A) UPPER GI ENDOSCOPY  Patient Location: PACU  Anesthesia Type:General  Level of Consciousness:  sedated, patient cooperative and responds to stimulation  Airway & Oxygen Therapy:Patient Spontanous Breathing and Patient connected to face mask oxgen  Post-op Assessment:  Report given to PACU RN and Post -op Vital signs reviewed and stable  Post vital signs:  Reviewed and stable  Last Vitals:  Filed Vitals:   10/02/15 0938  BP: 132/55  Pulse: 100  Temp:   Resp:     Complications: No apparent anesthesia complications

## 2015-10-03 LAB — COMPREHENSIVE METABOLIC PANEL
ALT: 20 U/L (ref 14–54)
AST: 20 U/L (ref 15–41)
Albumin: 3.6 g/dL (ref 3.5–5.0)
Alkaline Phosphatase: 71 U/L (ref 38–126)
Anion gap: 9 (ref 5–15)
BILIRUBIN TOTAL: 0.5 mg/dL (ref 0.3–1.2)
BUN: 6 mg/dL (ref 6–20)
CHLORIDE: 108 mmol/L (ref 101–111)
CO2: 20 mmol/L — ABNORMAL LOW (ref 22–32)
CREATININE: 0.74 mg/dL (ref 0.44–1.00)
Calcium: 9 mg/dL (ref 8.9–10.3)
GFR calc Af Amer: >60 mL/min (ref >60)
Glucose, Bld: 116 mg/dL — ABNORMAL HIGH (ref 65–99)
Potassium: 3.8 mmol/L (ref 3.5–5.1)
Sodium: 137 mmol/L (ref 135–145)
TOTAL PROTEIN: 7.4 g/dL (ref 6.5–8.1)

## 2015-10-03 LAB — HEMOGLOBIN AND HEMATOCRIT, BLOOD
HCT: 39.3 % (ref 36.0–46.0)
Hemoglobin: 12.9 g/dL (ref 12.0–15.0)

## 2015-10-03 LAB — CBC WITH DIFFERENTIAL/PLATELET
Basophils Absolute: 0 10*3/uL (ref 0.0–0.1)
Basophils Relative: 0 %
EOS PCT: 0 %
Eosinophils Absolute: 0 10*3/uL (ref 0.0–0.7)
HEMATOCRIT: 39 % (ref 36.0–46.0)
Hemoglobin: 12.6 g/dL (ref 12.0–15.0)
LYMPHS ABS: 2.4 10*3/uL (ref 0.7–4.0)
LYMPHS PCT: 20 %
MCH: 28.4 pg (ref 26.0–34.0)
MCHC: 32.3 g/dL (ref 30.0–36.0)
MCV: 88 fL (ref 78.0–100.0)
MONO ABS: 0.9 10*3/uL (ref 0.1–1.0)
Monocytes Relative: 7 %
NEUTROS ABS: 8.8 10*3/uL — AB (ref 1.7–7.7)
Neutrophils Relative %: 73 %
PLATELETS: 333 10*3/uL (ref 150–400)
RBC: 4.43 MIL/uL (ref 3.87–5.11)
RDW: 14.8 % (ref 11.5–15.5)
WBC: 12.2 10*3/uL — ABNORMAL HIGH (ref 4.0–10.5)

## 2015-10-03 MED ORDER — HYDROCODONE-ACETAMINOPHEN 7.5-325 MG/15ML PO SOLN
10.0000 mL | ORAL | Status: DC | PRN
  Administered 2015-10-03 – 2015-10-05 (×3): 10 mL via ORAL
  Filled 2015-10-03 (×3): qty 15

## 2015-10-03 MED ORDER — METOPROLOL TARTRATE 25 MG PO TABS
25.0000 mg | ORAL_TABLET | Freq: Every day | ORAL | Status: DC
  Administered 2015-10-03 – 2015-10-05 (×3): 25 mg via ORAL
  Filled 2015-10-03 (×4): qty 1

## 2015-10-03 NOTE — Progress Notes (Signed)
1 Day Post-Op  Subjective: Nausea and some epigastric "soreness". More nausea than soreness. Ambulated.   Objective: Vital signs in last 24 hours: Temp:  [97.5 F (36.4 C)-99.6 F (37.6 C)] 98.7 F (37.1 C) (10/05 0510) Pulse Rate:  [76-100] 84 (10/05 0510) Resp:  [16-21] 18 (10/05 0510) BP: (129-160)/(55-97) 155/97 mmHg (10/05 0510) SpO2:  [95 %-100 %] 99 % (10/05 0510)    Intake/Output from previous day: 10/04 0701 - 10/05 0700 In: 2400 [I.V.:2400] Out: 2950 [Urine:2950] Intake/Output this shift: Total I/O In: -  Out: 450 [Urine:450]  Alert, nontoxic, looks well cta b/l Reg Soft,obese, min TTP, incisions c/d/i +SCDs  Lab Results:   Recent Labs  10/02/15 1035 10/03/15 0535  WBC  --  12.2*  HGB 12.5 12.6  HCT 38.5 39.0  PLT  --  333   BMET  Recent Labs  10/03/15 0535  NA 137  K 3.8  CL 108  CO2 20*  GLUCOSE 116*  BUN 6  CREATININE 0.74  CALCIUM 9.0   PT/INR No results for input(s): LABPROT, INR in the last 72 hours. ABG No results for input(s): PHART, HCO3 in the last 72 hours.  Invalid input(s): PCO2, PO2  Studies/Results: No results found.  Anti-infectives: Anti-infectives    Start     Dose/Rate Route Frequency Ordered Stop   10/02/15 0600  levofloxacin (LEVAQUIN) IVPB 750 mg     750 mg 100 mL/hr over 90 Minutes Intravenous On call to O.R. 10/01/15 1350 10/02/15 0744      Assessment/Plan: s/p Procedure(s): LAPAROSCOPIC GASTRIC SLEEVE RESECTION (N/A) UPPER GI ENDOSCOPY Principal Problem:   Morbid obesity with BMI of 50.0-59.9, adult (HCC) Active Problems:   Hypertension, benign   Diabetes mellitus type 2 in obese (HCC)   Low HDL (under 40)   OSA (obstructive sleep apnea)   S/P laparoscopic sleeve gastrectomy  Vitals ok. No fever. No tachycardia. Will start POD 1 diet Ambulate, IS, pulm toilet vte prophylaxis - continue Antiemetics  [name]. [name], MD, FACS General, Bariatric, & Minimally Invasive Surgery [facility], [place]   LOS: 1 day    Gayland Curry 10/03/2015

## 2015-10-04 ENCOUNTER — Inpatient Hospital Stay (HOSPITAL_COMMUNITY): Payer: Medicare Other | Admitting: Certified Registered"

## 2015-10-04 ENCOUNTER — Telehealth: Payer: Self-pay | Admitting: Internal Medicine

## 2015-10-04 LAB — CBC WITH DIFFERENTIAL/PLATELET
BASOS ABS: 0 10*3/uL (ref 0.0–0.1)
Basophils Relative: 0 %
EOS ABS: 0.1 10*3/uL (ref 0.0–0.7)
EOS PCT: 1 %
HCT: 36.9 % (ref 36.0–46.0)
Hemoglobin: 12.5 g/dL (ref 12.0–15.0)
Lymphocytes Relative: 33 %
Lymphs Abs: 3.2 10*3/uL (ref 0.7–4.0)
MCH: 29.6 pg (ref 26.0–34.0)
MCHC: 33.9 g/dL (ref 30.0–36.0)
MCV: 87.4 fL (ref 78.0–100.0)
Monocytes Absolute: 1 10*3/uL (ref 0.1–1.0)
Monocytes Relative: 10 %
Neutro Abs: 5.4 10*3/uL (ref 1.7–7.7)
Neutrophils Relative %: 56 %
PLATELETS: 327 10*3/uL (ref 150–400)
RBC: 4.22 MIL/uL (ref 3.87–5.11)
RDW: 14.8 % (ref 11.5–15.5)
WBC: 9.7 10*3/uL (ref 4.0–10.5)

## 2015-10-04 MED ORDER — PROMETHAZINE HCL 6.25 MG/5ML PO SYRP
12.5000 mg | ORAL_SOLUTION | Freq: Four times a day (QID) | ORAL | Status: DC | PRN
  Filled 2015-10-04: qty 10

## 2015-10-04 MED ORDER — HYDROCODONE-ACETAMINOPHEN 7.5-325 MG/15ML PO SOLN: 15.0000 mL | ORAL | Status: DC | PRN

## 2015-10-04 MED ORDER — ALUM & MAG HYDROXIDE-SIMETH 200-200-20 MG/5ML PO SUSP: 15.0000 mL | Freq: Four times a day (QID) | ORAL | Status: DC | PRN

## 2015-10-04 MED ORDER — PROMETHAZINE HCL 25 MG RE SUPP: 25.0000 mg | Freq: Four times a day (QID) | RECTAL | Status: DC | PRN

## 2015-10-04 MED ORDER — ONDANSETRON 4 MG PO TBDP
4.0000 mg | ORAL_TABLET | Freq: Four times a day (QID) | ORAL | Status: DC | PRN
Start: 2015-10-04 — End: 2015-10-05

## 2015-10-04 NOTE — Plan of Care (Signed)
Problem: Food- and Nutrition-Related Knowledge Deficit (NB-1.1) Goal: Nutrition education Formal process to instruct or train a patient/client in a skill or to impart knowledge to help patients/clients voluntarily manage or modify food choices and eating behavior to maintain or improve health. Outcome: Completed/Met Date Met:  10/04/15 Nutrition Education Note  Received consult for diet education per DROP protocol.   Discussed 2 week post op diet with pt. Emphasized that liquids must be non carbonated, non caffeinated, and sugar free. Fluid goals discussed. Pt to follow up with outpatient bariatric RD for further diet progression after 2 weeks. Multivitamins and minerals also reviewed. Teach back method used, pt expressed understanding, expect good compliance.   Diet: First 2 Weeks  You will see the nutritionist about two (2) weeks after your surgery. The nutritionist will increase the types of foods you can eat if you are handling liquids well:  If you have severe vomiting or nausea and cannot handle clear liquids lasting longer than 1 day, call your surgeon  Protein Shake  Drink at least 2 ounces of shake 5-6 times per day  Each serving of protein shakes (usually 8 - 12 ounces) should have a minimum of:  15 grams of protein  And no more than 5 grams of carbohydrate  Goal for protein each day:  Men = 80 grams per day  Women = 60 grams per day  Protein powder may be added to fluids such as non-fat milk or Lactaid milk or Soy milk (limit to 35 grams added protein powder per serving)   Hydration  Slowly increase the amount of water and other clear liquids as tolerated (See Acceptable Fluids)  Slowly increase the amount of protein shake as tolerated  Sip fluids slowly and throughout the day  May use sugar substitutes in small amounts (no more than 6 - 8 packets per day; i.e. Splenda)   Fluid Goal  The first goal is to drink at least 8 ounces of protein shake/drink per day (or as directed  by the nutritionist); some examples of protein shakes are [employer], [employer]R [employer], EAS Edge HP, and Unjury. See handout from pre-op Bariatric Education Class:  Slowly increase the amount of protein shake you drink as tolerated  You may find it easier to slowly sip shakes throughout the day  It is important to get your proteins in first  Your fluid goal is to drink 64 - 100 ounces of fluid daily  It may take a few weeks to build up to this  32 oz (or more) should be clear liquids  And  32 oz (or more) should be full liquids (see below for examples)  Liquids should not contain sugar, caffeine, or carbonation   Clear Liquids:  Water or Sugar-free flavored water (i.e. Fruit H2O, Propel)  Decaffeinated coffee or tea (sugar-free)  Crystal Lite, Wyler's Lite, Minute Maid Lite  Sugar-free Jell-O  Bouillon or broth  Sugar-free Popsicle: *Less than 20 calories each; Limit 1 per day   Full Liquids:  Protein Shakes/Drinks + 2 choices per day of other full liquids  Full liquids must be:  No More Than 12 grams of Carbs per serving  No More Than 3 grams of Fat per serving  Strained low-fat cream soup  Non-Fat milk  Fat-free Lactaid Milk  Sugar-free yogurt (Dannon Lite & Fit, Greek yogurt)     Clayton Bibles, MS, RD, LDN Pager: [phone] After Hours Pager: [phone]

## 2015-10-04 NOTE — Telephone Encounter (Signed)
Spoke with Cecille Rubin, bariatric nurse coordinator.  Pt had split night in 07/2014- has not received cpap from this study.  Per Cecille Rubin, states that she doesn't see in CY's impression of study that pt needs cpap as AHI was within normal limits.  PCP ordered sleep study and CY was the one to read the sleep study.  I advised that pt never followed up in our office and would need to follow up with PCP to see what transpired from the sleep study.  Lori expressed understanding.  Nothing further needed.

## 2015-10-04 NOTE — Progress Notes (Signed)
RT attempted to place patient on CPAP but she refused to wear it tonight.

## 2015-10-04 NOTE — Progress Notes (Signed)
Vilinda Flake, RN, Bariatric Nurse, aware via phone of pt status that IV team attempted x 2 to restart IV, then Peggy, CRNA stuck pt x 3 and was able to establish line in RT [facility]. RAC recently started swelling with pain at site. IV discontinued. Margarita Grizzle to contact Dr. [name].

## 2015-10-04 NOTE — Care Management Important Message (Signed)
Important Message  Patient Details  Name: Melinda Ruiz MRN: [id number] Date of Birth: 1981-09-10   Medicare Important Message Given:  Yes-second notification given    Camillo Flaming 10/04/2015, 11:44 AMImportant Message  Patient Details  Name: Melinda Ruiz MRN: [id number] Date of Birth: [birth year]   Medicare Important Message Given:  Yes-second notification given    Camillo Flaming 10/04/2015, 11:44 AM

## 2015-10-04 NOTE — Progress Notes (Signed)
Patient alert and oriented, Post op day 2.  Provided support and encouragement.  Encouraged pulmonary toilet, ambulation and small sips of liquids.  Patient is reluctant to ambulate, completing only 3 laps today, discussed need for frequent ambulation with increased risk of VTE post-op for bariatric population.  Patients fluid volume intake is inadequate to maintain hydration at this time, patient has tolerated 2oz of protein shake today since 9am.  All questions answered.  Will continue to monitor.

## 2015-10-04 NOTE — Progress Notes (Signed)
2 Days Post-Op  Subjective: Less nausea; still with epigastric discomfort. Ambulated some. Nausea and pain meds work. Tolerated the liquid pain medication with a little nausea.   Objective: Vital signs in last 24 hours: Temp:  [98.4 F (36.9 C)-99.4 F (37.4 C)] 98.6 F (37 C) (10/06 0845) Pulse Rate:  [72-88] 88 (10/06 0845) Resp:  [17-18] 18 (10/06 0845) BP: (132-169)/(84-102) 152/97 mmHg (10/06 0845) SpO2:  [94 %-100 %] 100 % (10/06 0845) Last BM Date: 10/01/15  Intake/Output from previous day: 10/05 0701 - 10/06 0700 In: 3220 [P.O.:120; I.V.:3000; IV Piggyback:100] Out: 3000 [Urine:3000] Intake/Output this shift:    Alert, flat affect, nontoxic, not ill appearing cta b/l Reg Soft, obese, incision c/d/i; min TTP No edema  Lab Results:   Recent Labs  10/03/15 0535 10/03/15 1415 10/04/15 0547  WBC 12.2*  --  9.7  HGB 12.6 12.9 12.5  HCT 39.0 39.3 36.9  PLT 333  --  327   BMET  Recent Labs  10/03/15 0535  NA 137  K 3.8  CL 108  CO2 20*  GLUCOSE 116*  BUN 6  CREATININE 0.74  CALCIUM 9.0   PT/INR No results for input(s): LABPROT, INR in the last 72 hours. ABG No results for input(s): PHART, HCO3 in the last 72 hours.  Invalid input(s): PCO2, PO2  Studies/Results: No results found.  Anti-infectives: Anti-infectives    Start     Dose/Rate Route Frequency Ordered Stop   10/02/15 0600  levofloxacin (LEVAQUIN) IVPB 750 mg     750 mg 100 mL/hr over 90 Minutes Intravenous On call to O.R. 10/01/15 1350 10/02/15 0744      Assessment/Plan: s/p Procedure(s): LAPAROSCOPIC GASTRIC SLEEVE RESECTION (N/A) UPPER GI ENDOSCOPY  No fever. Not ill appearing. No tachycardia - will adv to POD 2 diet rediscussed with pt that nausea is pretty common Will see how she does - Cont VTE prophylaxis HTN - cont home med Discussed dc instructions in case she meets dc criteria later today Discussed importance of walking  [name]. [name], MD, FACS General,  Bariatric, & Minimally Invasive Surgery [facility], [place]   LOS: 2 days    Gayland Curry 10/04/2015

## 2015-10-04 NOTE — Progress Notes (Signed)
Melinda Flake, RN phoned back to report Dr. [name] aware of pt status and inability to obtain/maintain peripheral IV access. See new order in EPIC per MD.

## 2015-10-04 NOTE — Progress Notes (Signed)
Pt refused CPAP qhs.  Machine removed from room.

## 2015-10-05 LAB — CBC
HCT: 39.8 % (ref 36.0–46.0)
HEMOGLOBIN: 13.4 g/dL (ref 12.0–15.0)
MCH: 29.7 pg (ref 26.0–34.0)
MCHC: 33.7 g/dL (ref 30.0–36.0)
MCV: 88.2 fL (ref 78.0–100.0)
Platelets: 377 10*3/uL (ref 150–400)
RBC: 4.51 MIL/uL (ref 3.87–5.11)
RDW: 14.6 % (ref 11.5–15.5)
WBC: 10.9 10*3/uL — ABNORMAL HIGH (ref 4.0–10.5)

## 2015-10-05 NOTE — Progress Notes (Signed)
Patient alert and oriented, pain is controlled. Patient is tolerating fluids,  advanced to protein shake yesterday, patient did not tolerate well, but today volume has increased and patient is feeling much better today.  Reviewed Gastric sleeve discharge instructions with patient and patient is able to articulate understanding.  Provided information on BELT program, Support Group and [facility] outpatient pharmacy. All questions answered, will continue to monitor.

## 2015-10-05 NOTE — Discharge Instructions (Signed)

## 2015-10-05 NOTE — Discharge Summary (Signed)
Physician Discharge Summary  Patient ID:  BAO COREAS  MRN: [id number]  DOB/AGE: [birth year] 34 y.o.  Admit date: 10/02/2015 Discharge date: 10/05/2015  Discharge Diagnoses:  1.  Morbid obesity  Weight - 294, BMI - 55.5 2.  Bipolar 3.  DM 4.  Depression 5.  HTN 6.  OSA 7.  On chronic disability   Principal Problem:   Morbid obesity with BMI of 50.0-59.9, adult (HCC) Active Problems:   Hypertension, benign   Diabetes mellitus type 2 in obese (HCC)   Low HDL (under 40)   OSA (obstructive sleep apnea)   S/P laparoscopic sleeve gastrectomy   Operation: Procedure(s):   LAPAROSCOPIC GASTRIC SLEEVE RESECTION,  UPPER GI ENDOSCOPY on 10/02/2015 - D. Lucia Gaskins  Discharged Condition: good  Hospital Course: Melinda Ruiz is an 34 y.o. female whose primary care physician is Elizabeth Palau, MD and who was admitted 10/02/2015 with a chief complaint of morbid obesity.   She was brought to the operating room on 10/02/2015 and underwent LAPAROSCOPIC GASTRIC SLEEVE RESECTION,  UPPER GI ENDOSCOPY.   She had nausea and this kept her an extra day. But she is doing better now and ready to go home  The discharge instructions were reviewed with the patient.  Consults: None  Significant Diagnostic Studies: Results for orders placed or performed during the hospital encounter of 10/02/15  Pregnancy, urine STAT morning of surgery  Result Value Ref Range   Preg Test, Ur NEGATIVE NEGATIVE  Glucose, capillary  Result Value Ref Range   Glucose-Capillary 87 65 - 99 mg/dL   Comment 1 Document in Chart   Hemoglobin and hematocrit, blood  Result Value Ref Range   Hemoglobin 12.5 12.0 - 15.0 g/dL   HCT 38.5 36.0 - 46.0 %  Glucose, capillary  Result Value Ref Range   Glucose-Capillary 146 (H) 65 - 99 mg/dL   Comment 1 Notify RN    Comment 2 Document in Chart   CBC WITH DIFFERENTIAL  Result Value Ref Range   WBC 12.2 (H) 4.0 - 10.5 K/uL   RBC 4.43 3.87 - 5.11 MIL/uL   Hemoglobin  12.6 12.0 - 15.0 g/dL   HCT 39.0 36.0 - 46.0 %   MCV 88.0 78.0 - 100.0 fL   MCH 28.4 26.0 - 34.0 pg   MCHC 32.3 30.0 - 36.0 g/dL   RDW 14.8 11.5 - 15.5 %   Platelets 333 150 - 400 K/uL   Neutrophils Relative % 73 %   Neutro Abs 8.8 (H) 1.7 - 7.7 K/uL   Lymphocytes Relative 20 %   Lymphs Abs 2.4 0.7 - 4.0 K/uL   Monocytes Relative 7 %   Monocytes Absolute 0.9 0.1 - 1.0 K/uL   Eosinophils Relative 0 %   Eosinophils Absolute 0.0 0.0 - 0.7 K/uL   Basophils Relative 0 %   Basophils Absolute 0.0 0.0 - 0.1 K/uL  Comprehensive metabolic panel  Result Value Ref Range   Sodium 137 135 - 145 mmol/L   Potassium 3.8 3.5 - 5.1 mmol/L   Chloride 108 101 - 111 mmol/L   CO2 20 (L) 22 - 32 mmol/L   Glucose, Bld 116 (H) 65 - 99 mg/dL   BUN 6 6 - 20 mg/dL   Creatinine, Ser 0.74 0.44 - 1.00 mg/dL   Calcium 9.0 8.9 - 10.3 mg/dL   Total Protein 7.4 6.5 - 8.1 g/dL   Albumin 3.6 3.5 - 5.0 g/dL   AST 20 15 - 41 U/L   ALT 20  14 - 54 U/L   Alkaline Phosphatase 71 38 - 126 U/L   Total Bilirubin 0.5 0.3 - 1.2 mg/dL   GFR calc non Af Amer >60 >60 mL/min   GFR calc Af Amer >60 >60 mL/min   Anion gap 9 5 - 15  Hemoglobin and hematocrit, blood  Result Value Ref Range   Hemoglobin 12.9 12.0 - 15.0 g/dL   HCT 39.3 36.0 - 46.0 %  CBC with Differential  Result Value Ref Range   WBC 9.7 4.0 - 10.5 K/uL   RBC 4.22 3.87 - 5.11 MIL/uL   Hemoglobin 12.5 12.0 - 15.0 g/dL   HCT 36.9 36.0 - 46.0 %   MCV 87.4 78.0 - 100.0 fL   MCH 29.6 26.0 - 34.0 pg   MCHC 33.9 30.0 - 36.0 g/dL   RDW 14.8 11.5 - 15.5 %   Platelets 327 150 - 400 K/uL   Neutrophils Relative % 56 %   Neutro Abs 5.4 1.7 - 7.7 K/uL   Lymphocytes Relative 33 %   Lymphs Abs 3.2 0.7 - 4.0 K/uL   Monocytes Relative 10 %   Monocytes Absolute 1.0 0.1 - 1.0 K/uL   Eosinophils Relative 1 %   Eosinophils Absolute 0.1 0.0 - 0.7 K/uL   Basophils Relative 0 %   Basophils Absolute 0.0 0.0 - 0.1 K/uL  CBC  Result Value Ref Range   WBC 10.9 (H) 4.0 -  10.5 K/uL   RBC 4.51 3.87 - 5.11 MIL/uL   Hemoglobin 13.4 12.0 - 15.0 g/dL   HCT 39.8 36.0 - 46.0 %   MCV 88.2 78.0 - 100.0 fL   MCH 29.7 26.0 - 34.0 pg   MCHC 33.7 30.0 - 36.0 g/dL   RDW 14.6 11.5 - 15.5 %   Platelets 377 150 - 400 K/uL    No results found.  Discharge Exam:  Filed Vitals:   10/05/15 0550  BP: 122/85  Pulse: 95  Temp: 98.6 F (37 C)  Resp: 18    General: Obese AA F who is alert and generally healthy appearing.  Lungs: Clear to auscultation and symmetric breath sounds. Heart:  RRR. No murmur or rub. Abdomen: Soft. No mass. No hernia.  Incisions look.  Discharge Medications:     Medication List    STOP taking these medications        acetaminophen 500 MG tablet  Commonly known as:  TYLENOL     HYDROcodone-acetaminophen 5-325 MG tablet  Commonly known as:  NORCO/VICODIN  Replaced by:  HYDROcodone-acetaminophen 7.5-325 mg/15 ml solution     medroxyPROGESTERone 150 MG/ML injection  Commonly known as:  DEPO-PROVERA     metFORMIN 500 MG tablet  Commonly known as:  GLUCOPHAGE      TAKE these medications        HYDROcodone-acetaminophen 7.5-325 mg/15 ml solution  Commonly known as:  HYCET  Take 15 mLs by mouth every 4 (four) hours as needed for moderate pain.     metoprolol tartrate 25 MG tablet  Commonly known as:  LOPRESSOR  Take 25 mg by mouth daily.  Notes to Patient:  Monitor Blood Pressure Daily and keep a log for primary care physician.  You may need to make changes to your medications with rapid weight loss.           Disposition: 01-Home or Self Care      Discharge Instructions    Ambulate hourly while awake    Complete by:  As directed  Call MD for:  difficulty breathing, headache or visual disturbances    Complete by:  As directed      Call MD for:  persistant dizziness or light-headedness    Complete by:  As directed      Call MD for:  persistant nausea and vomiting    Complete by:  As directed      Call MD for:   redness, tenderness, or signs of infection (pain, swelling, redness, odor or green/yellow discharge around incision site)    Complete by:  As directed      Call MD for:  severe uncontrolled pain    Complete by:  As directed      Call MD for:  temperature >101 F    Complete by:  As directed      Diet bariatric full liquid    Complete by:  As directed      Incentive spirometry    Complete by:  As directed   Perform hourly while awake           Follow-up Information    Follow up with Gayland Curry, MD. Go on 10/12/2015.   Specialty:  General Surgery   Why:  For Post-Op Check at 10:15AM   Contact information:   [address] [place]  [place] [address] [phone]        Signed: Alphonsa Overall, M.D., [facility] Office:  [phone]  10/05/2015, 7:44 AM

## 2015-10-05 NOTE — Progress Notes (Signed)
Patient is alert and oriented, vital signs are stable, patient with no nausea or vomiting today, incisions are within normal limits, pain is tolerable, patient is tolerating her premier shakes, up ambulating, discharge instructions reviewed with patient, patient to follow up with [facility] next Friday, questions and concerns answered Neta Mends RN 10-05-2015 11:17pm

## 2015-10-08 ENCOUNTER — Telehealth (HOSPITAL_COMMUNITY): Payer: Self-pay

## 2015-10-08 NOTE — Telephone Encounter (Addendum)
Attempted DROP discharge call, no answer, left message to return call   Made discharge phone call to patient per DROP protocol. Asking the following questions.    1. Do you have someone to care for you now that you are home?  yes 2. Are you having pain now that is not relieved by your pain medication?  no 3. Are you able to drink the recommended daily amount of fluids (48 ounces minimum/day) and protein (60-80 grams/day) as prescribed by the dietitian or nutritional counselor?  i am trying, I am drinking 2.5*16.9 oz, and protein shakes 4. Are you taking the vitamins and minerals as prescribed?  yes 5. Do you have the "on call" number to contact your surgeon if you have a problem or question?  yes 6. Are your incisions free of redness, swelling or drainage? (If steri strips, address that these can fall off, shower as tolerated) yes 7. Have your bowels moved since your surgery?  If not, are you passing gas?  yes 8. Are you up and walking 3-4 times per day?  yes

## 2015-10-16 ENCOUNTER — Encounter: Payer: Medicare Other | Attending: General Surgery

## 2015-10-16 VITALS — Ht 61.0 in | Wt 274.5 lb

## 2015-10-16 DIAGNOSIS — Z713 Dietary counseling and surveillance: Secondary | ICD-10-CM | POA: Diagnosis not present

## 2015-10-16 DIAGNOSIS — Z6841 Body Mass Index (BMI) 40.0 and over, adult: Secondary | ICD-10-CM | POA: Diagnosis not present

## 2015-10-16 DIAGNOSIS — E669 Obesity, unspecified: Secondary | ICD-10-CM | POA: Diagnosis present

## 2015-10-16 DIAGNOSIS — E119 Type 2 diabetes mellitus without complications: Secondary | ICD-10-CM | POA: Diagnosis not present

## 2015-10-17 NOTE — Progress Notes (Signed)
Bariatric Class:  Appt start time: 1530 end time:  1630.  2 Week Post-Operative Nutrition Class  Patient was seen on 10/17/2015 for Post-[facility]ative Nutrition [facility]   Surgery date: 10/02/15 Surgery type: Sleeve gastrectomy Start weight at [facility]: 304 lbs on 04/17/15 Weight today: 274.5 lbs  Weight change: 23.6 lbs  TANITA  BODY COMP RESULTS  09/10/15 10/17/15   BMI (kg/m^2) N/A 51.9   Fat Mass (lbs)  147.0   Fat Free Mass (lbs)  127.0   Total Body Water (lbs)  93.5    The following the learning objectives were met by the patient during this course:  Identifies Phase 3A (Soft, High Proteins) Dietary Goals and will begin from 2 weeks post-operatively to 2 months post-operatively  Identifies appropriate sources of fluids and proteins   States protein recommendations and appropriate sources post-operatively  Identifies the need for appropriate texture modifications, mastication, and bite sizes when consuming solids  Identifies appropriate multivitamin and calcium sources post-operatively  Describes the need for physical activity post-operatively and will follow MD recommendations  States when to call healthcare provider regarding medication questions or post-operative complications  Handouts given during class include:  Phase 3A: Soft, High Protein Diet Handout  Follow-Up Plan: Patient will follow-up at [facility] in 6 weeks for 2 month post-op nutrition visit for diet advancement per MD.

## 2015-10-18 ENCOUNTER — Ambulatory Visit: Payer: Self-pay | Admitting: Internal Medicine

## 2015-10-24 ENCOUNTER — Ambulatory Visit: Payer: Medicare Other | Admitting: Internal Medicine

## 2015-11-27 ENCOUNTER — Encounter: Payer: Medicare Other | Attending: General Surgery | Admitting: Dietician

## 2015-11-27 ENCOUNTER — Encounter: Payer: Self-pay | Admitting: Dietician

## 2015-11-27 VITALS — Ht 61.0 in | Wt 259.5 lb

## 2015-11-27 DIAGNOSIS — Z713 Dietary counseling and surveillance: Secondary | ICD-10-CM | POA: Diagnosis not present

## 2015-11-27 DIAGNOSIS — Z6841 Body Mass Index (BMI) 40.0 and over, adult: Secondary | ICD-10-CM | POA: Insufficient documentation

## 2015-11-27 DIAGNOSIS — E669 Obesity, unspecified: Secondary | ICD-10-CM | POA: Diagnosis present

## 2015-11-27 DIAGNOSIS — E119 Type 2 diabetes mellitus without complications: Secondary | ICD-10-CM | POA: Insufficient documentation

## 2015-11-27 NOTE — Progress Notes (Signed)
  Follow-up visit:  8 Weeks Post-Operative Sleeve gastrectomy Surgery  Medical Nutrition Therapy:  Appt start time: 0325 end time:  350  Primary concerns today: Post-operative Bariatric Surgery Nutrition Management. Bonny returns today having lost another 15 pounds. She reports that water flavorings are too sweet. No longer likes the protein shakes. Vitamins are making her feel bad; she states that her surgeon recommended gummies. Having trouble meeting protein needs.  Surgery date: 10/02/15 Surgery type: Sleeve gastrectomy Start weight at [facility]: 304 lbs on 04/17/15 Weight today: 259.5 lbs Weight change: 15 lbs Total weight lost: 44.5 lbs  TANITA  BODY COMP RESULTS  09/10/15 10/17/15 11/27/15   BMI (kg/m^2) N/A 51.9 49   Fat Mass (lbs)  147.0 133.5   Fat Free Mass (lbs)  127.0 126   Total Body Water (lbs)  93.5 92    Preferred Learning Style:   No preference indicated   Learning Readiness:   Ready  24-hr recall: B (AM): [place] bacon, sometimes skips (7g) Snk (AM):   L (PM): about 1/4 cup tuna fish or chicken salad (7g) Snk (PM): sugar free jello  D (PM): 1.5 oz grilled chicken (10g) Snk (PM):   Fluid intake: 64+ oz water Estimated total protein intake: ~24 g per day  Medications: no longer on Metoprolol or Metformin Supplementation: taking  Using straws: yes, not having gas pain Drinking while eating: no Hair loss: none Carbonated beverages: none N/V/D/C: some nausea and having acid reflux; constipation Dumping syndrome: none  Recent physical activity:  Reports she has not been cleared for activity yet; walking some  Progress Towards Goal(s):  In progress.  Handouts given during visit include:  Phase 3B lean protein + non starchy vegetables   Nutritional Diagnosis:  [place]-3.3 Overweight/obesity related to past poor dietary habits and physical inactivity as evidenced by patient w/ recent sleeve gastrectomy surgery following dietary guidelines for continued weight  loss.     Intervention:  Nutrition counseling provided.  Teaching Method Utilized:  Visual Auditory Hands on  Barriers to learning/adherence to lifestyle change: none  Demonstrated degree of understanding via:  Teach Back   Monitoring/Evaluation:  Dietary intake, exercise, and body weight. Follow up in 1 months for 3 month post-op visit.

## 2015-11-27 NOTE — Patient Instructions (Addendum)
Goals:  Follow Phase 3B: High Protein + Non-Starchy Vegetables  Eat 3-6 small meals/snacks, every 3-5 hrs  Increase lean protein foods to meet 60g goal  Start adding protein snacks between meals (low fat cheese, deli meat, boiled egg, beef/turkey jerky)  Increase fluid intake to 64oz +  Avoid drinking 15 minutes before, during and 30 minutes after eating  Aim for >30 min of physical activity daily  TANITA  BODY COMP RESULTS  09/10/15 10/17/15 11/27/15   BMI (kg/m^2) N/A 51.9 49   Fat Mass (lbs)  147.0 133.5   Fat Free Mass (lbs)  127.0 126   Total Body Water (lbs)  93.5 92

## 2016-01-14 ENCOUNTER — Encounter: Payer: Medicare Other | Attending: General Surgery | Admitting: Dietician

## 2016-01-14 ENCOUNTER — Encounter: Payer: Self-pay | Admitting: Dietician

## 2016-01-14 DIAGNOSIS — E669 Obesity, unspecified: Secondary | ICD-10-CM | POA: Diagnosis present

## 2016-01-14 DIAGNOSIS — E119 Type 2 diabetes mellitus without complications: Secondary | ICD-10-CM | POA: Insufficient documentation

## 2016-01-14 DIAGNOSIS — Z6841 Body Mass Index (BMI) 40.0 and over, adult: Secondary | ICD-10-CM | POA: Insufficient documentation

## 2016-01-14 DIAGNOSIS — Z713 Dietary counseling and surveillance: Secondary | ICD-10-CM | POA: Insufficient documentation

## 2016-01-14 NOTE — Progress Notes (Signed)
  Follow-up visit: 3 months Post-Operative Sleeve gastrectomy Surgery  Medical Nutrition Therapy:  Appt start time: 135 end time:  200  Primary concerns today: Post-operative Bariatric Surgery Nutrition Management. Melinda Ruiz returns today having lost another 15 pounds. She reports that water flavorings are still too sweet.  Still working on meeting protein needs; has resumed Premier protein shakes. Often skips breakfast. Not taking vitamins and Calcium. Tries not to weigh herself at home.   Surgery date: 10/02/15 Surgery type: Sleeve gastrectomy Start weight at [facility]: 304 lbs on 04/17/15 Weight today: 244.5 lbs Weight change: 15 lbs Total weight lost: 59.5 lbs Weight goal: 170 lbs  TANITA  BODY COMP RESULTS  09/10/15 10/17/15 11/27/15 01/14/16   BMI (kg/m^2) N/A 51.9 49 46.2   Fat Mass (lbs)  147.0 133.5 123   Fat Free Mass (lbs)  127.0 126 121.5   Total Body Water (lbs)  93.5 92 89    Preferred Learning Style:   No preference indicated   Learning Readiness:   Ready  24-hr recall: Wakes up around 5:30 am B (AM): skips, sometimes liver pudding or protein shake (0-30g) Snk (AM):   L (11AM-12PM): salad with 3 oz grilled chicken (21g)  Snk (PM): sometimes beef sticks D (PM): 3 oz chicken or shrimp (21g) Snk (PM):   Fluid intake: 3 bottles water + 24 oz G2 (75 oz total) Estimated total protein intake: 42-72 g per day  Medications: no longer on Metoprolol or Metformin; increased acid reflux medication Supplementation: not taking  Using straws: yes, not having gas pain Drinking while eating: no Hair loss: none Carbonated beverages: none N/V/D/C: some nausea and having acid reflux; constipation Dumping syndrome: none  Recent physical activity:  Always takes stairs at work and walks some  Progress Towards Goal(s):  In progress.   Nutritional Diagnosis:  [place]-3.3 Overweight/obesity related to past poor dietary habits and physical inactivity as evidenced by patient w/ recent  sleeve gastrectomy surgery following dietary guidelines for continued weight loss.     Intervention:  Nutrition counseling provided.  Teaching Method Utilized:  Visual Auditory Hands on  Barriers to learning/adherence to lifestyle change: none  Demonstrated degree of understanding via:  Teach Back   Monitoring/Evaluation:  Dietary intake, exercise, and body weight. Follow up in 1 months for 4 month post-op visit.

## 2016-01-14 NOTE — Patient Instructions (Addendum)
Goals:  Follow Phase 3B: High Protein + Non-Starchy Vegetables  Eat 3-6 small meals/snacks, every 3-5 hrs  Increase lean protein foods to meet 60g goal  Start keeping protein snacks available (string cheese, deli meat, boiled egg, beef/turkey jerky, P3 packs, tuna packs, protein shake)  Try not to go more than 3-4 hours without eating  Start having Premier protein shake for breakfast  Aim for >30 min of physical activity daily  Keep focusing on non scale victories  Start taking vitamins again   Try Centrum complete  Take vitamins with food  Surgery date: 10/02/15 Surgery type: Sleeve gastrectomy Start weight at [facility]: 304 lbs on 04/17/15 Weight today: 244.5 lbs Weight change: 15 lbs Total weight lost: 59.5 lbs Weight goal: 170 lbs  TANITA  BODY COMP RESULTS  09/10/15 10/17/15 11/27/15 01/14/16   BMI (kg/m^2) N/A 51.9 49 46.2   Fat Mass (lbs)  147.0 133.5 123   Fat Free Mass (lbs)  127.0 126 121.5   Total Body Water (lbs)  93.5 92 89

## 2016-02-08 ENCOUNTER — Other Ambulatory Visit (HOSPITAL_COMMUNITY): Payer: Self-pay | Admitting: Specialist

## 2016-02-08 DIAGNOSIS — E049 Nontoxic goiter, unspecified: Secondary | ICD-10-CM

## 2016-02-11 ENCOUNTER — Encounter (HOSPITAL_COMMUNITY): Payer: Self-pay | Admitting: *Deleted

## 2016-02-11 DIAGNOSIS — Z86018 Personal history of other benign neoplasm: Secondary | ICD-10-CM | POA: Insufficient documentation

## 2016-02-11 DIAGNOSIS — Z79899 Other long term (current) drug therapy: Secondary | ICD-10-CM | POA: Insufficient documentation

## 2016-02-11 DIAGNOSIS — Z9049 Acquired absence of other specified parts of digestive tract: Secondary | ICD-10-CM | POA: Diagnosis not present

## 2016-02-11 DIAGNOSIS — Z87891 Personal history of nicotine dependence: Secondary | ICD-10-CM | POA: Insufficient documentation

## 2016-02-11 DIAGNOSIS — Z8659 Personal history of other mental and behavioral disorders: Secondary | ICD-10-CM | POA: Insufficient documentation

## 2016-02-11 DIAGNOSIS — Z3202 Encounter for pregnancy test, result negative: Secondary | ICD-10-CM | POA: Insufficient documentation

## 2016-02-11 DIAGNOSIS — Z9851 Tubal ligation status: Secondary | ICD-10-CM | POA: Diagnosis not present

## 2016-02-11 DIAGNOSIS — Z87442 Personal history of urinary calculi: Secondary | ICD-10-CM | POA: Insufficient documentation

## 2016-02-11 DIAGNOSIS — I10 Essential (primary) hypertension: Secondary | ICD-10-CM | POA: Diagnosis not present

## 2016-02-11 DIAGNOSIS — Z880 Allergy status to penicillin: Secondary | ICD-10-CM | POA: Insufficient documentation

## 2016-02-11 DIAGNOSIS — R1010 Upper abdominal pain, unspecified: Secondary | ICD-10-CM | POA: Diagnosis present

## 2016-02-11 DIAGNOSIS — E119 Type 2 diabetes mellitus without complications: Secondary | ICD-10-CM | POA: Insufficient documentation

## 2016-02-11 DIAGNOSIS — R1013 Epigastric pain: Secondary | ICD-10-CM | POA: Diagnosis not present

## 2016-02-11 DIAGNOSIS — Z862 Personal history of diseases of the blood and blood-forming organs and certain disorders involving the immune mechanism: Secondary | ICD-10-CM | POA: Diagnosis not present

## 2016-02-11 LAB — COMPREHENSIVE METABOLIC PANEL
ALT: 21 U/L (ref 14–54)
AST: 17 U/L (ref 15–41)
Albumin: 3.3 g/dL — ABNORMAL LOW (ref 3.5–5.0)
Alkaline Phosphatase: 79 U/L (ref 38–126)
Anion gap: 10 (ref 5–15)
BILIRUBIN TOTAL: 0.3 mg/dL (ref 0.3–1.2)
BUN: 9 mg/dL (ref 6–20)
CHLORIDE: 106 mmol/L (ref 101–111)
CO2: 24 mmol/L (ref 22–32)
CREATININE: 0.9 mg/dL (ref 0.44–1.00)
Calcium: 9.5 mg/dL (ref 8.9–10.3)
Glucose, Bld: 79 mg/dL (ref 65–99)
POTASSIUM: 4 mmol/L (ref 3.5–5.1)
Sodium: 140 mmol/L (ref 135–145)
TOTAL PROTEIN: 7.7 g/dL (ref 6.5–8.1)

## 2016-02-11 LAB — CBC
HCT: 39.4 % (ref 36.0–46.0)
Hemoglobin: 12.7 g/dL (ref 12.0–15.0)
MCH: 29.5 pg (ref 26.0–34.0)
MCHC: 32.2 g/dL (ref 30.0–36.0)
MCV: 91.4 fL (ref 78.0–100.0)
PLATELETS: 393 10*3/uL (ref 150–400)
RBC: 4.31 MIL/uL (ref 3.87–5.11)
RDW: 13.8 % (ref 11.5–15.5)
WBC: 11.8 10*3/uL — AB (ref 4.0–10.5)

## 2016-02-11 LAB — URINE MICROSCOPIC-ADD ON

## 2016-02-11 LAB — URINALYSIS, ROUTINE W REFLEX MICROSCOPIC
GLUCOSE, UA: NEGATIVE mg/dL
Hgb urine dipstick: NEGATIVE
KETONES UR: 15 mg/dL — AB
NITRITE: NEGATIVE
PH: 6 (ref 5.0–8.0)
Protein, ur: NEGATIVE mg/dL
Specific Gravity, Urine: 1.026 (ref 1.005–1.030)

## 2016-02-11 LAB — LIPASE, BLOOD: LIPASE: 20 U/L (ref 11–51)

## 2016-02-11 LAB — POC URINE PREG, ED: Preg Test, Ur: NEGATIVE

## 2016-02-11 NOTE — ED Notes (Signed)
Pt states she had gastric sleeve surgery in October, pt states she has "unbearable pain" through her abdominal area and vomiting for about 1 week. Pt saw her doctor last week for follow up and adjusting meds, said if the medications were not helping she would have to have endoscopy

## 2016-02-12 ENCOUNTER — Emergency Department (HOSPITAL_COMMUNITY): Payer: Medicare Other

## 2016-02-12 ENCOUNTER — Emergency Department (HOSPITAL_COMMUNITY)
Admission: EM | Admit: 2016-02-12 | Discharge: 2016-02-12 | Disposition: A | Discharge status: Home / Self Care | Payer: Medicare Other | Attending: Emergency Medicine | Admitting: Emergency Medicine

## 2016-02-12 DIAGNOSIS — R1013 Epigastric pain: Secondary | ICD-10-CM | POA: Diagnosis not present

## 2016-02-12 MED ORDER — DIPHENHYDRAMINE HCL 25 MG PO CAPS
50.0000 mg | ORAL_CAPSULE | Freq: Once | ORAL | Status: AC
  Administered 2016-02-12: 50 mg via ORAL
  Filled 2016-02-12: qty 2

## 2016-02-12 MED ORDER — HYDROMORPHONE HCL 1 MG/ML IJ SOLN
1.0000 mg | Freq: Once | INTRAMUSCULAR | Status: AC
  Administered 2016-02-12: 1 mg via INTRAMUSCULAR

## 2016-02-12 MED ORDER — ONDANSETRON HCL 4 MG/5ML PO SOLN: 4.0000 mg | Freq: Four times a day (QID) | ORAL | Status: DC | PRN

## 2016-02-12 MED ORDER — SODIUM CHLORIDE 0.9 % IV BOLUS (SEPSIS): 500.0000 mL | Freq: Once | INTRAVENOUS | Status: DC

## 2016-02-12 MED ORDER — IOHEXOL 300 MG/ML  SOLN: 25.0000 mL | Freq: Once | INTRAMUSCULAR | Status: DC | PRN

## 2016-02-12 MED ORDER — ONDANSETRON 4 MG PO TBDP
4.0000 mg | ORAL_TABLET | Freq: Once | ORAL | Status: AC
  Administered 2016-02-12: 4 mg via ORAL
  Filled 2016-02-12: qty 1

## 2016-02-12 MED ORDER — ONDANSETRON HCL 4 MG/2ML IJ SOLN
4.0000 mg | Freq: Once | INTRAMUSCULAR | Status: DC
  Filled 2016-02-12: qty 2

## 2016-02-12 MED ORDER — HYDROMORPHONE HCL 1 MG/ML IJ SOLN
1.0000 mg | Freq: Once | INTRAMUSCULAR | Status: DC
  Filled 2016-02-12: qty 1

## 2016-02-12 MED ORDER — HYDROCODONE-ACETAMINOPHEN 7.5-325 MG/15ML PO SOLN: 15.0000 mL | ORAL | Status: DC | PRN

## 2016-02-12 NOTE — Discharge Instructions (Signed)

## 2016-02-12 NOTE — ED Notes (Signed)
IV team unable to get IV, MD notified

## 2016-02-12 NOTE — ED Notes (Signed)
Pt verbalized understanding of d/c instructions, prescriptions, and follow-up care. No further questions/concerns, VSS, ambulatory w/ steady gait (refused wheelchair) 

## 2016-02-12 NOTE — ED Provider Notes (Signed)
CSN: [id number]     Arrival date & time 02/11/16  1935 History  By signing my name below, I, Rowan Blase, attest that this documentation has been prepared under the direction and in the presence of Orpah Greek, MD . Electronically Signed: Rowan Blase, Scribe. 02/12/2016. 1:14 AM.   Chief Complaint  Patient presents with  . Abdominal Pain   The history is provided by the patient. No language interpreter was used.   HPI Comments:  Melinda Ruiz is a 35 y.o. female with PMHx of HTN, DM, and morbid obesity who presents to the Emergency Department complaining of moderate-severe, burning upper abdominal pain for one week. Pt also reports moderate acid reflux that has been going on for a while. No alleviating factors noted. Pt had gastric sleeve surgery in October 2016; she also notes cholecystectomy. Pt denies lower abdominal pain.  Past Medical History  Diagnosis Date  . Complication of anesthesia     "Oxygen level drops"  . Kidney stones 2010  . Constipation   . Anemia     during pregancy  . 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  Has been [facility]  . Depression   . Hypertension     takes meds daily  . Headache(784.0)   . Fibroid   . Diabetes mellitus without complication ([place])   . Morbid obesity with BMI of 50.0-59.9, adult ([place])   . Bipolar disorder ([place])   . Schizophrenia ([place])   . Family history of adverse reaction to anesthesia     thinks Mom has problem with oxygen level dropping   Past Surgical History  Procedure Laterality Date  . Cesarean section  2006, 2011  . Cystectomy    . Lympectomy    . Lymp glands removed--under arm bil    . Cholecystectomy  06/2011  . Tubal ligation  2011  . Multiple tooth extractions  2003    and wisdom teeth  . Pilonidial cyst removed  1990's  . Carpal tunnel release  02/03/2012    Procedure: CARPAL TUNNEL RELEASE;  Surgeon: Meredith Pel, MD;  Location: [facility];  Service:  Orthopedics;  Laterality: Left;  left carpal tunnel release  . Ganglion cyst excision  02/03/2012    Procedure: REMOVAL GANGLION OF WRIST;  Surgeon: Meredith Pel, MD;  Location: [facility];  Service: Orthopedics;  Laterality: Left;  left dorsal ganglion cyst excision  . Carpal tunnel release  01/11/2013    Procedure: CARPAL TUNNEL RELEASE;  Surgeon: Meredith Pel, MD;  Location: [facility];  Service: Orthopedics;  Laterality: Right;  Hand Set, Small Retractors, Lead Hand  . Breath tek h pylori N/A 05/31/2015    Procedure: BREATH TEK H PYLORI;  Surgeon: Greer Pickerel, MD;  Location: [facility];  Service: General;  Laterality: N/A;  . Laparoscopic gastric sleeve resection N/A 10/02/2015    Procedure: LAPAROSCOPIC GASTRIC SLEEVE RESECTION;  Surgeon: Greer Pickerel, MD;  Location: [facility];  Service: General;  Laterality: N/A;  . Upper gi endoscopy  10/02/2015    Procedure: UPPER GI ENDOSCOPY;  Surgeon: Greer Pickerel, MD;  Location: [facility];  Service: General;;   Family History  Problem Relation Age of Onset  . Kidney cancer Father   . Cancer Father   . Diabetes Mellitus II Father   . Hypertension Father   . Anesthesia problems Neg Hx   . Arthritis Mother   . Cancer Mother   . Diabetes Mellitus II  Mother   . Hypertension Mother    Social History  Substance Use Topics  . Smoking status: Former Smoker -- 0.25 packs/day    Types: Cigarettes    Quit date: 10/01/2009  . Smokeless tobacco: Never Used  . Alcohol Use: No   OB History    Gravida Para Term Preterm AB TAB SAB Ectopic Multiple Living   3 2 2  0 1 0 1 0 0 2     Review of Systems  Constitutional: Negative for fever.  Gastrointestinal: Positive for abdominal pain (epigastric).  All other systems reviewed and are negative.  Allergies  Penicillins and Oxycodone-acetaminophen  Home Medications   Prior to Admission medications   Medication Sig Start Date End Date Taking? Authorizing Provider  HYDROcodone-acetaminophen (HYCET) 7.5-325  mg/15 ml solution Take 15 mLs by mouth every 4 (four) hours as needed for moderate pain. 10/04/15   Greer Pickerel, MD  metoprolol tartrate (LOPRESSOR) 25 MG tablet Take 25 mg by mouth daily.     Historical Provider, MD   BP 126/69 mmHg  Pulse 80  Temp(Src) 98.1 F (36.7 C) (Oral)  Resp 16  Ht 5' (1.524 m)  Wt 236 lb (107.049 kg)  BMI 46.09 kg/m2  SpO2 100%  LMP 09/29/2015 Physical Exam  Constitutional: She is oriented to person, place, and time. She appears well-developed and well-nourished. No distress.  HENT:  Head: Normocephalic and atraumatic.  Right Ear: Hearing normal.  Left Ear: Hearing normal.  Nose: Nose normal.  Mouth/Throat: Oropharynx is clear and moist and mucous membranes are normal.  Eyes: Conjunctivae and EOM are normal. Pupils are equal, round, and reactive to light.  Neck: Normal range of motion. Neck supple.  Cardiovascular: Regular rhythm, S1 normal and S2 normal.  Exam reveals no gallop and no friction rub.   No murmur heard. Pulmonary/Chest: Effort normal and breath sounds normal. No respiratory distress. She exhibits no tenderness.  Abdominal: Soft. Normal appearance and bowel sounds are normal. There is no hepatosplenomegaly. There is tenderness. There is no rebound, no guarding, no tenderness at McBurney's point and negative Murphy's sign. No hernia.  Epigastric tenderness; no guarding; no rebound  Musculoskeletal: Normal range of motion.  Neurological: She is alert and oriented to person, place, and time. She has normal strength. No cranial nerve deficit or sensory deficit. Coordination normal. GCS eye subscore is 4. GCS verbal subscore is 5. GCS motor subscore is 6.  Skin: Skin is warm, dry and intact. No rash noted. No cyanosis.  Psychiatric: She has a normal mood and affect. Her speech is normal and behavior is normal. Thought content normal.  Nursing note and vitals reviewed.   ED Course  Procedures   IV access: Attempted ultrasound-guided peripheral  IV and left antecubital fossa 1. Patient had one single, very small vessel identified on ultrasound. Attempted to cannulate with 20-gauge IV catheter but was unsuccessful.  DIAGNOSTIC STUDIES: Oxygen Saturation is 100% on RA, normal by my interpretation.    COORDINATION OF CARE: 1:09 AM Will order abdominal scan. Will administer fluids and pain medication. Discussed treatment plan with pt at bedside and pt agreed to plan.  Labs Review Labs Reviewed  COMPREHENSIVE METABOLIC PANEL - Abnormal; Notable for the following:    Albumin 3.3 (*)    All other components within normal limits  CBC - Abnormal; Notable for the following:    WBC 11.8 (*)    All other components within normal limits  URINALYSIS, ROUTINE W REFLEX MICROSCOPIC (NOT AT [facility]) - Abnormal;  Notable for the following:    Color, Urine AMBER (*)    APPearance CLOUDY (*)    Bilirubin Urine SMALL (*)    Ketones, ur 15 (*)    Leukocytes, UA TRACE (*)    All other components within normal limits  URINE MICROSCOPIC-ADD ON - Abnormal; Notable for the following:    Squamous Epithelial / LPF 6-30 (*)    Bacteria, UA MANY (*)    All other components within normal limits  LIPASE, BLOOD  POC URINE PREG, ED    Imaging Review No results found. I have personally reviewed and evaluated these images and lab results as part of my medical decision-making.   EKG Interpretation None     MDM   Final diagnoses:  None  Abdominal pain  Patient presents to the ER for evaluation of abdominal pain. Patient has been having ongoing issues with mid epigastric pain. Patient has had previous gastric sleeve surgery. Her lab work was unremarkable. CT scan performed does not show any movement of the sleeve or acute abnormality secondary to the surgery. Patient tells me that Dr. [name], her surgeon, as mentioned endoscopy. I suspect that this would be the next step. She is already on Protonix. Will add Carafate, have patient follow-up with Dr. [name]  to arrange for endoscopy.  I personally performed the services described in this documentation, which was scribed in my presence. The recorded information has been reviewed and is accurate.'    Orpah Greek, MD 02/12/16 [phone]

## 2016-02-13 ENCOUNTER — Other Ambulatory Visit: Payer: Self-pay | Admitting: General Surgery

## 2016-02-13 DIAGNOSIS — K219 Gastro-esophageal reflux disease without esophagitis: Secondary | ICD-10-CM

## 2016-02-14 ENCOUNTER — Ambulatory Visit: Payer: Self-pay | Admitting: Dietician

## 2016-02-15 ENCOUNTER — Other Ambulatory Visit: Payer: Self-pay | Admitting: General Surgery

## 2016-02-15 ENCOUNTER — Ambulatory Visit
Admission: RE | Admit: 2016-02-15 | Discharge: 2016-02-15 | Disposition: A | Discharge status: Home / Self Care | Payer: Medicare Other | Source: Ambulatory Visit | Attending: General Surgery | Admitting: General Surgery

## 2016-02-15 DIAGNOSIS — K219 Gastro-esophageal reflux disease without esophagitis: Secondary | ICD-10-CM

## 2016-02-20 ENCOUNTER — Encounter (HOSPITAL_COMMUNITY)
Admission: RE | Admit: 2016-02-20 | Discharge: 2016-02-20 | Disposition: A | Discharge status: Home / Self Care | Payer: Medicare Other | Source: Ambulatory Visit | Attending: Specialist | Admitting: Specialist

## 2016-02-20 DIAGNOSIS — E049 Nontoxic goiter, unspecified: Secondary | ICD-10-CM

## 2016-02-20 DIAGNOSIS — E042 Nontoxic multinodular goiter: Secondary | ICD-10-CM | POA: Insufficient documentation

## 2016-02-21 ENCOUNTER — Encounter (HOSPITAL_COMMUNITY)
Admission: RE | Admit: 2016-02-21 | Discharge: 2016-02-21 | Disposition: A | Discharge status: Home / Self Care | Payer: Medicare Other | Source: Ambulatory Visit | Attending: Specialist | Admitting: Specialist

## 2016-02-21 DIAGNOSIS — E042 Nontoxic multinodular goiter: Secondary | ICD-10-CM | POA: Diagnosis present

## 2016-02-21 MED ORDER — SODIUM IODIDE I 131 CAPSULE
12.0000 | Freq: Once | INTRAVENOUS | Status: AC | PRN
  Administered 2016-02-20: 12 via ORAL

## 2016-02-21 MED ORDER — SODIUM PERTECHNETATE TC 99M INJECTION
10.0000 | Freq: Once | INTRAVENOUS | Status: AC | PRN
  Administered 2016-02-21: 10 via INTRAVENOUS

## 2016-03-13 ENCOUNTER — Ambulatory Visit (INDEPENDENT_AMBULATORY_CARE_PROVIDER_SITE_OTHER): Payer: Medicare Other | Admitting: Family Medicine

## 2016-03-13 ENCOUNTER — Encounter: Payer: Self-pay | Admitting: Family Medicine

## 2016-03-13 VITALS — BP 133/84 | HR 97 | Ht 60.0 in | Wt 229.4 lb

## 2016-03-13 DIAGNOSIS — N926 Irregular menstruation, unspecified: Secondary | ICD-10-CM | POA: Diagnosis present

## 2016-03-13 NOTE — Patient Instructions (Signed)
Levonorgestrel intrauterine device (IUD) What is this medicine? LEVONORGESTREL IUD (LEE voe nor jes trel) is a contraceptive (birth control) device. The device is placed inside the uterus by a healthcare professional. It is used to prevent pregnancy and can also be used to treat heavy bleeding that occurs during your period. Depending on the device, it can be used for 3 to 5 years. This medicine may be used for other purposes; ask your health care provider or pharmacist if you have questions. What should I tell my health care provider before I take this medicine? They need to know if you have any of these conditions: -abnormal Pap smear -cancer of the breast, uterus, or cervix -diabetes -endometritis -genital or pelvic infection now or in the past -have more than one sexual partner or your partner has more than one partner -heart disease -history of an ectopic or tubal pregnancy -immune system problems -IUD in place -liver disease or tumor -problems with blood clots or take blood-thinners -use intravenous drugs -uterus of unusual shape -vaginal bleeding that has not been explained -an unusual or allergic reaction to levonorgestrel, other hormones, silicone, or polyethylene, medicines, foods, dyes, or preservatives -pregnant or trying to get pregnant -breast-feeding How should I use this medicine? This device is placed inside the uterus by a health care professional. Talk to your pediatrician regarding the use of this medicine in children. Special care may be needed. Overdosage: If you think you have taken too much of this medicine contact a poison control center or emergency room at once. NOTE: This medicine is only for you. Do not share this medicine with others. What if I miss a dose? This does not apply. What may interact with this medicine? Do not take this medicine with any of the following medications: -amprenavir -bosentan -fosamprenavir This medicine may also interact with  the following medications: -aprepitant -barbiturate medicines for inducing sleep or treating seizures -bexarotene -griseofulvin -medicines to treat seizures like carbamazepine, ethotoin, felbamate, oxcarbazepine, phenytoin, topiramate -modafinil -pioglitazone -rifabutin -rifampin -rifapentine -some medicines to treat HIV infection like atazanavir, indinavir, lopinavir, nelfinavir, tipranavir, ritonavir -St. John's wort -warfarin This list may not describe all possible interactions. Give your health care provider a list of all the medicines, herbs, non-prescription drugs, or dietary supplements you use. Also tell them if you smoke, drink alcohol, or use illegal drugs. Some items may interact with your medicine. What should I watch for while using this medicine? Visit your doctor or health care professional for regular check ups. See your doctor if you or your partner has sexual contact with others, becomes HIV positive, or gets a sexual transmitted disease. This product does not protect you against HIV infection (AIDS) or other sexually transmitted diseases. You can check the placement of the IUD yourself by reaching up to the top of your vagina with clean fingers to feel the threads. Do not pull on the threads. It is a good habit to check placement after each menstrual period. Call your doctor right away if you feel more of the IUD than just the threads or if you cannot feel the threads at all. The IUD may come out by itself. You may become pregnant if the device comes out. If you notice that the IUD has come out use a backup birth control method like condoms and call your health care provider. Using tampons will not change the position of the IUD and are okay to use during your period. What side effects may I notice from receiving this medicine?   Side effects that you should report to your doctor or health care professional as soon as possible: -allergic reactions like skin rash, itching or  hives, swelling of the face, lips, or tongue -fever, flu-like symptoms -genital sores -high blood pressure -no menstrual period for 6 weeks during use -pain, swelling, warmth in the leg -pelvic pain or tenderness -severe or sudden headache -signs of pregnancy -stomach cramping -sudden shortness of breath -trouble with balance, talking, or walking -unusual vaginal bleeding, discharge -yellowing of the eyes or skin Side effects that usually do not require medical attention (report to your doctor or health care professional if they continue or are bothersome): -acne -breast pain -change in sex drive or performance -changes in weight -cramping, dizziness, or faintness while the device is being inserted -headache -irregular menstrual bleeding within first 3 to 6 months of use -nausea This list may not describe all possible side effects. Call your doctor for medical advice about side effects. You may report side effects to FDA at 1-800-FDA-1088. Where should I keep my medicine? This does not apply. NOTE: This sheet is a summary. It may not cover all possible information. If you have questions about this medicine, talk to your doctor, pharmacist, or health care provider.    2016, Elsevier/Gold Standard. (2012-01-15 13:54:04)  

## 2016-03-13 NOTE — Progress Notes (Signed)
   Subjective:    Patient ID: Melinda Ruiz, female    DOB: 1981/02/25, 35 y.o.   MRN: [id number]  HPI Patient seen for initiation of Depo-Provera. Patient has been on Trinessa for the past several years, however has had a lot of irregular bleeding on this. She has had a Mirena IUD in the past which he kept in for 2 months. She had normal periods on this. The IUD was placed shortly after having her daughter. Patient had a gastric sleeve placed proximally 6 months ago.   Review of Systems  Constitutional: Negative for fever, chills and fatigue.  Gastrointestinal: Negative for nausea, abdominal pain and diarrhea.       Objective:   Physical Exam  Constitutional: She appears well-developed.  HENT:  Head: Normocephalic and atraumatic.  Right Ear: External ear normal.  Left Ear: External ear normal.  Abdominal: Soft. She exhibits no distension and no mass. There is no tenderness. There is no rebound and no guarding.  Skin: Skin is warm and dry.  Psychiatric: She has a normal mood and affect. Her behavior is normal. Judgment and thought content normal.      Assessment & Plan:  1. Irregular menstrual bleeding I discussed with the patient that Depo-Provera would lead to weight gain, which would probably be an undesired side effect, given her struggles with obesity and her gastric bypass. Discussed that an IUD probably be the best form of contraception for her.  I discussed with patient that we could place an IUD today, however the patient desired to return at a different time for IUD placement.

## 2016-03-14 IMAGING — CR DG UGI W/ KUB
2 series · 2 of 2 positions shown · non-contrast
Comparison: None.

CLINICAL DATA: Pre bariatric screening. Former smoker. History of
hypertension

EXAM:
UPPER GI SERIES WITH KUB
TECHNIQUE: After obtaining a scout radiograph a routine upper GI series was
performed using thin barium
FLUOROSCOPY TIME:  Radiation Exposure Index (as provided by the
fluoroscopic device):
If the device does not provide the exposure index:
Fluoroscopy Time (in minutes and seconds):  36 seconds
Number of Acquired Images:  11

[abdomen supine (1 of 2)]
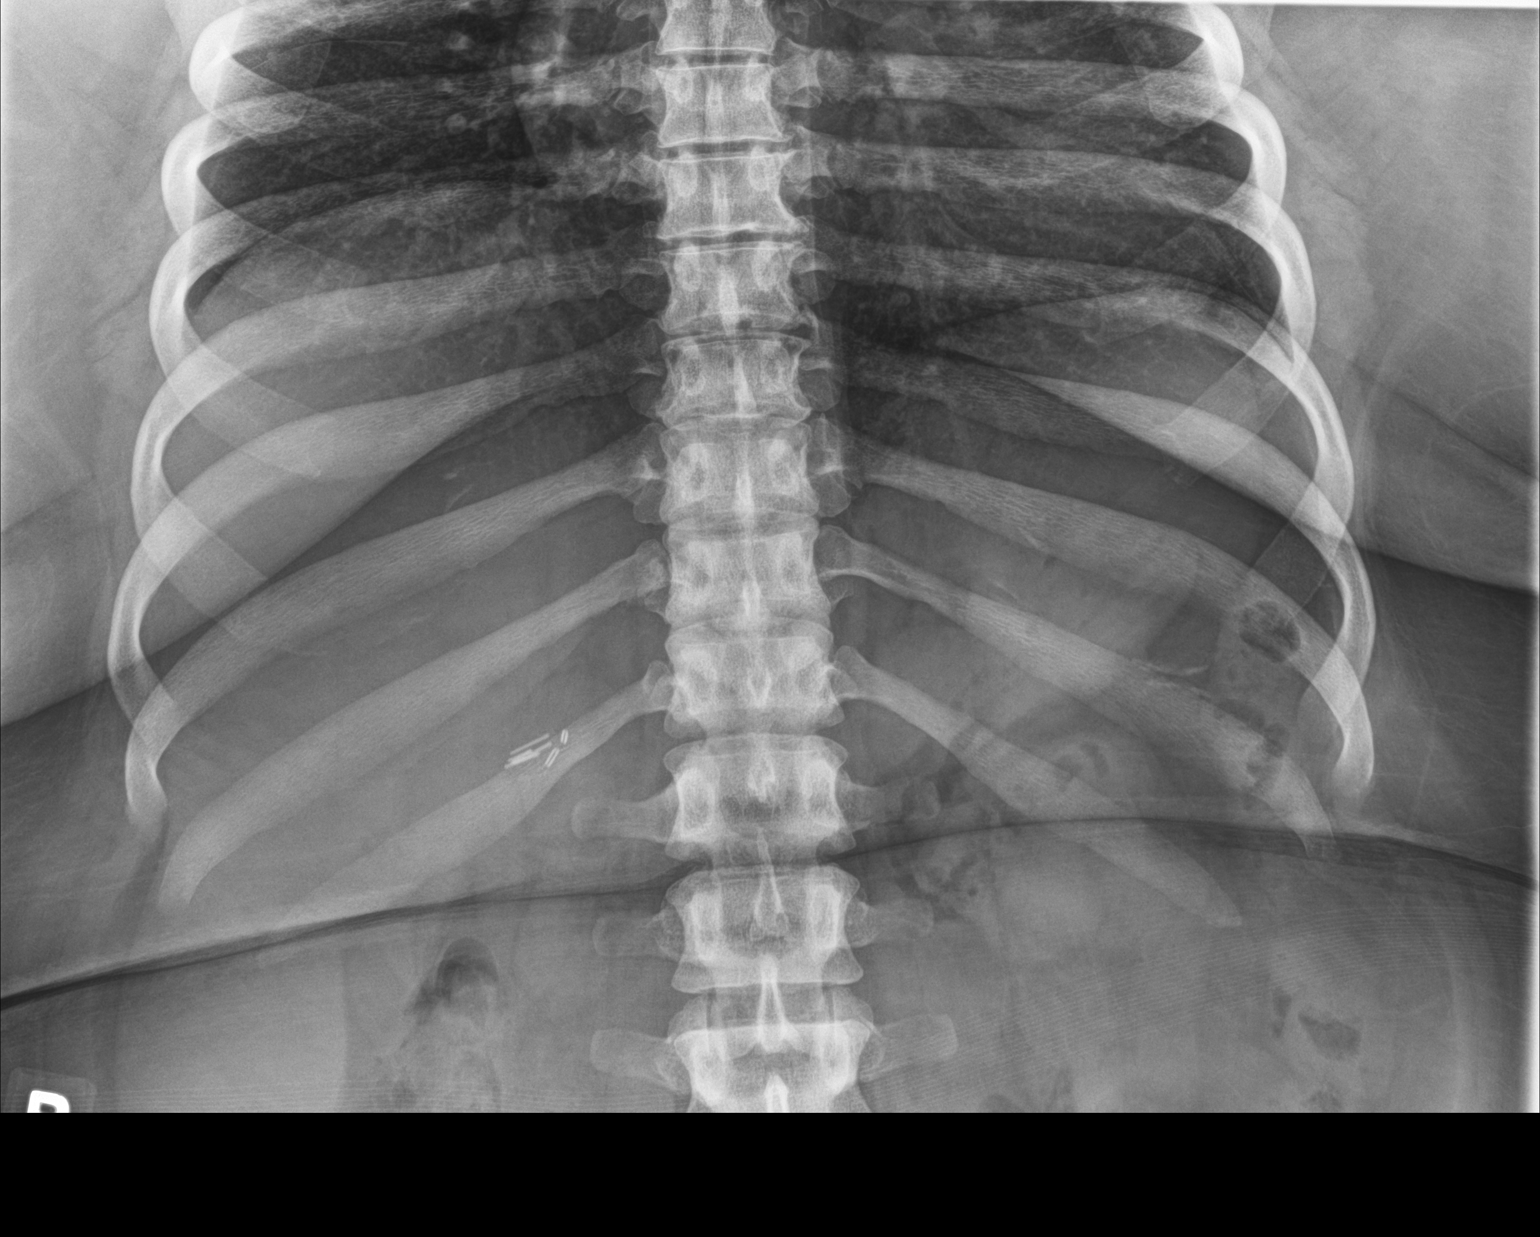

[abdomen supine (2 of 2)]
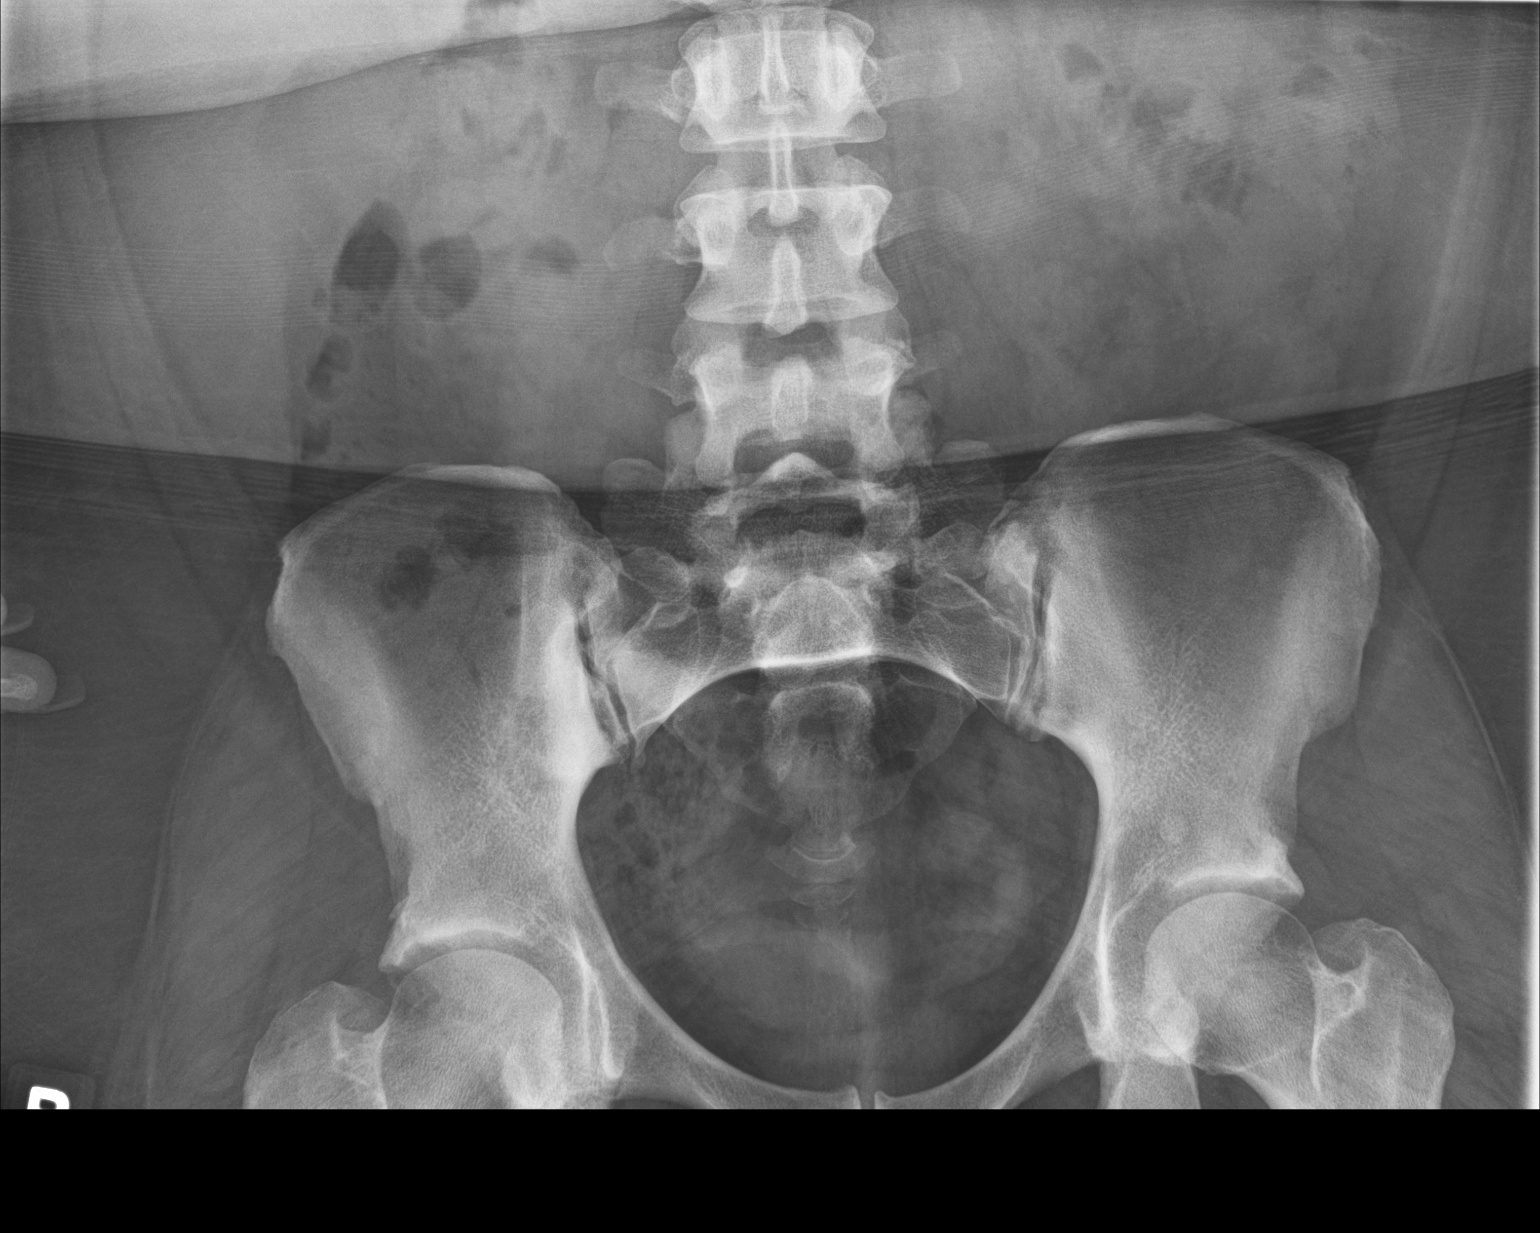

[2 of 2 positions shown; findings below may reference images not displayed]

FINDINGS: The on the scout radiograph there are surgical clips in the right
upper quadrant compatible with previous cholecystectomy. The bowel
gas pattern appears normal.

The esophagus is patent. No stricture or mass identified. Normal
motility. The stomach, duodenal bulb and sweep are unremarkable. No
hiatal hernia. No reflux identified.
IMPRESSION: 1. Normal upper GI exam.
2. Previous cholecystectomy.

## 2016-03-14 IMAGING — CR DG CHEST 2V
2 series · 2 of 2 positions shown · non-contrast
Comparison: 06/08/2012

CLINICAL DATA: Bariatric screening.  Former smoker.

EXAM:
CHEST  2 VIEW

[chest pa]
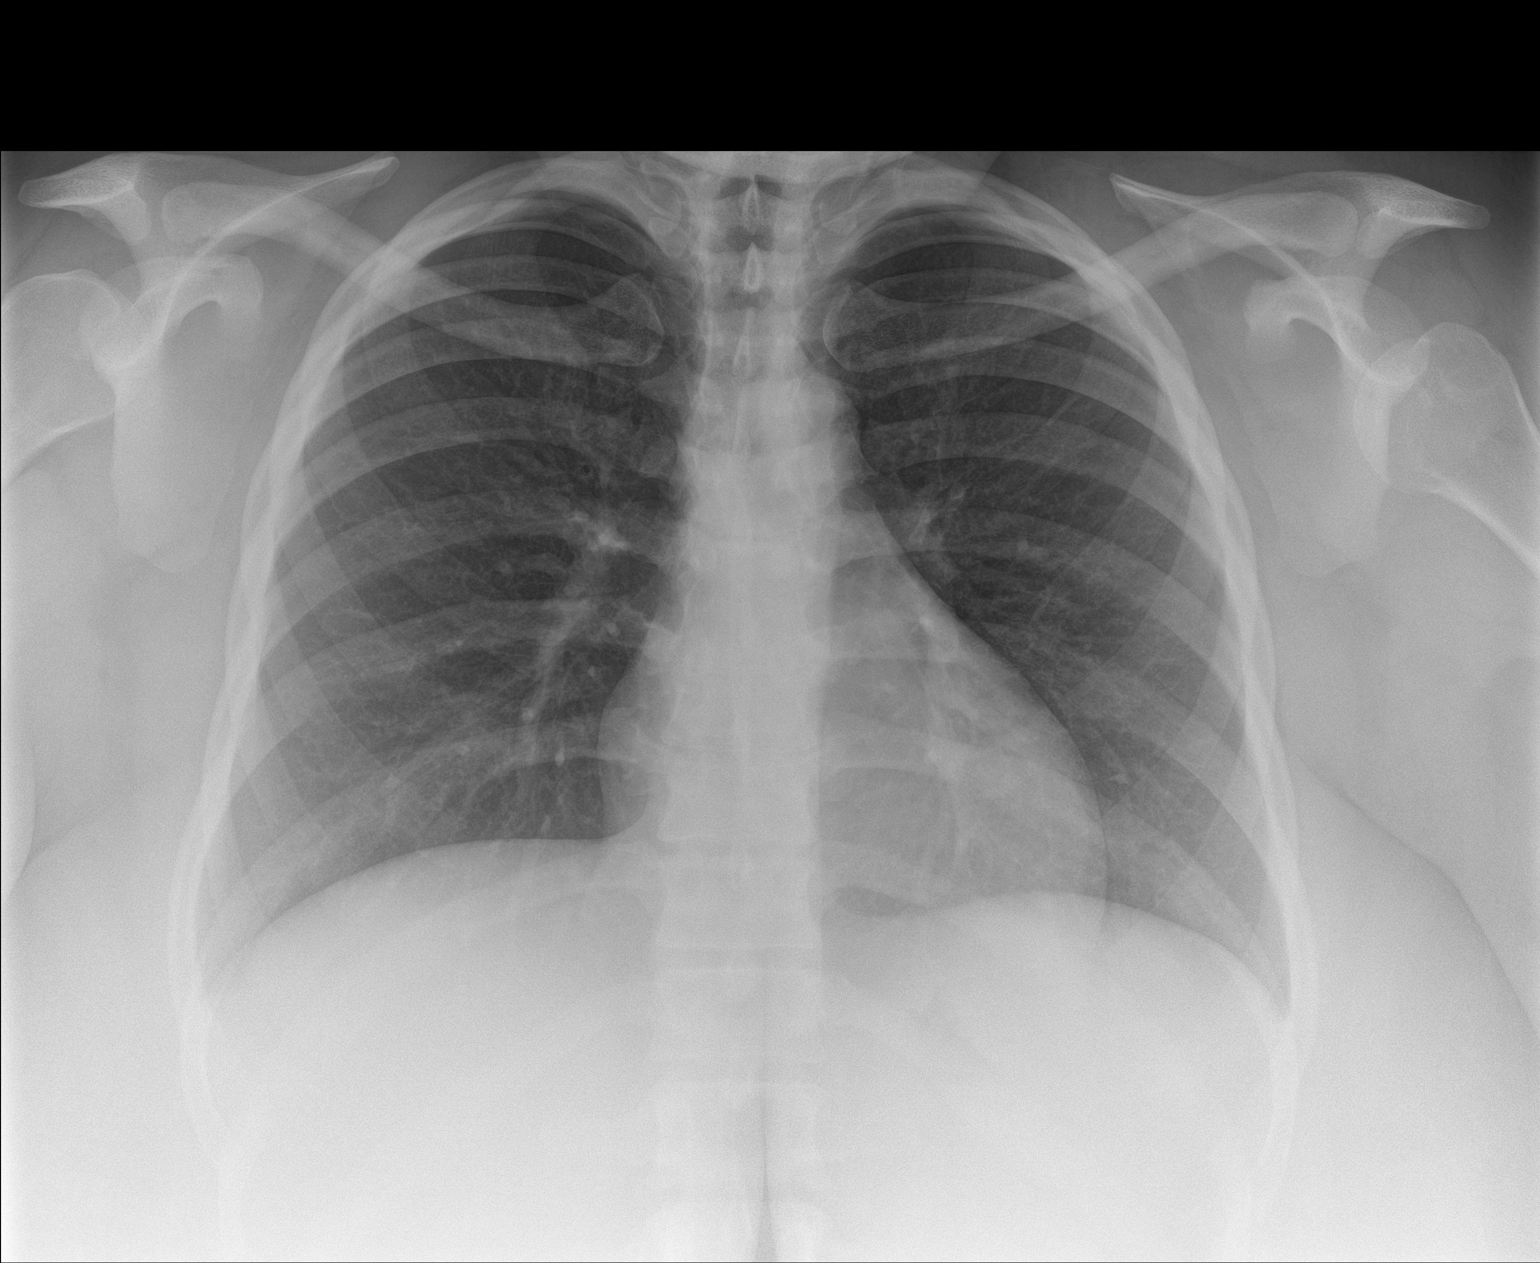

[chest lat]
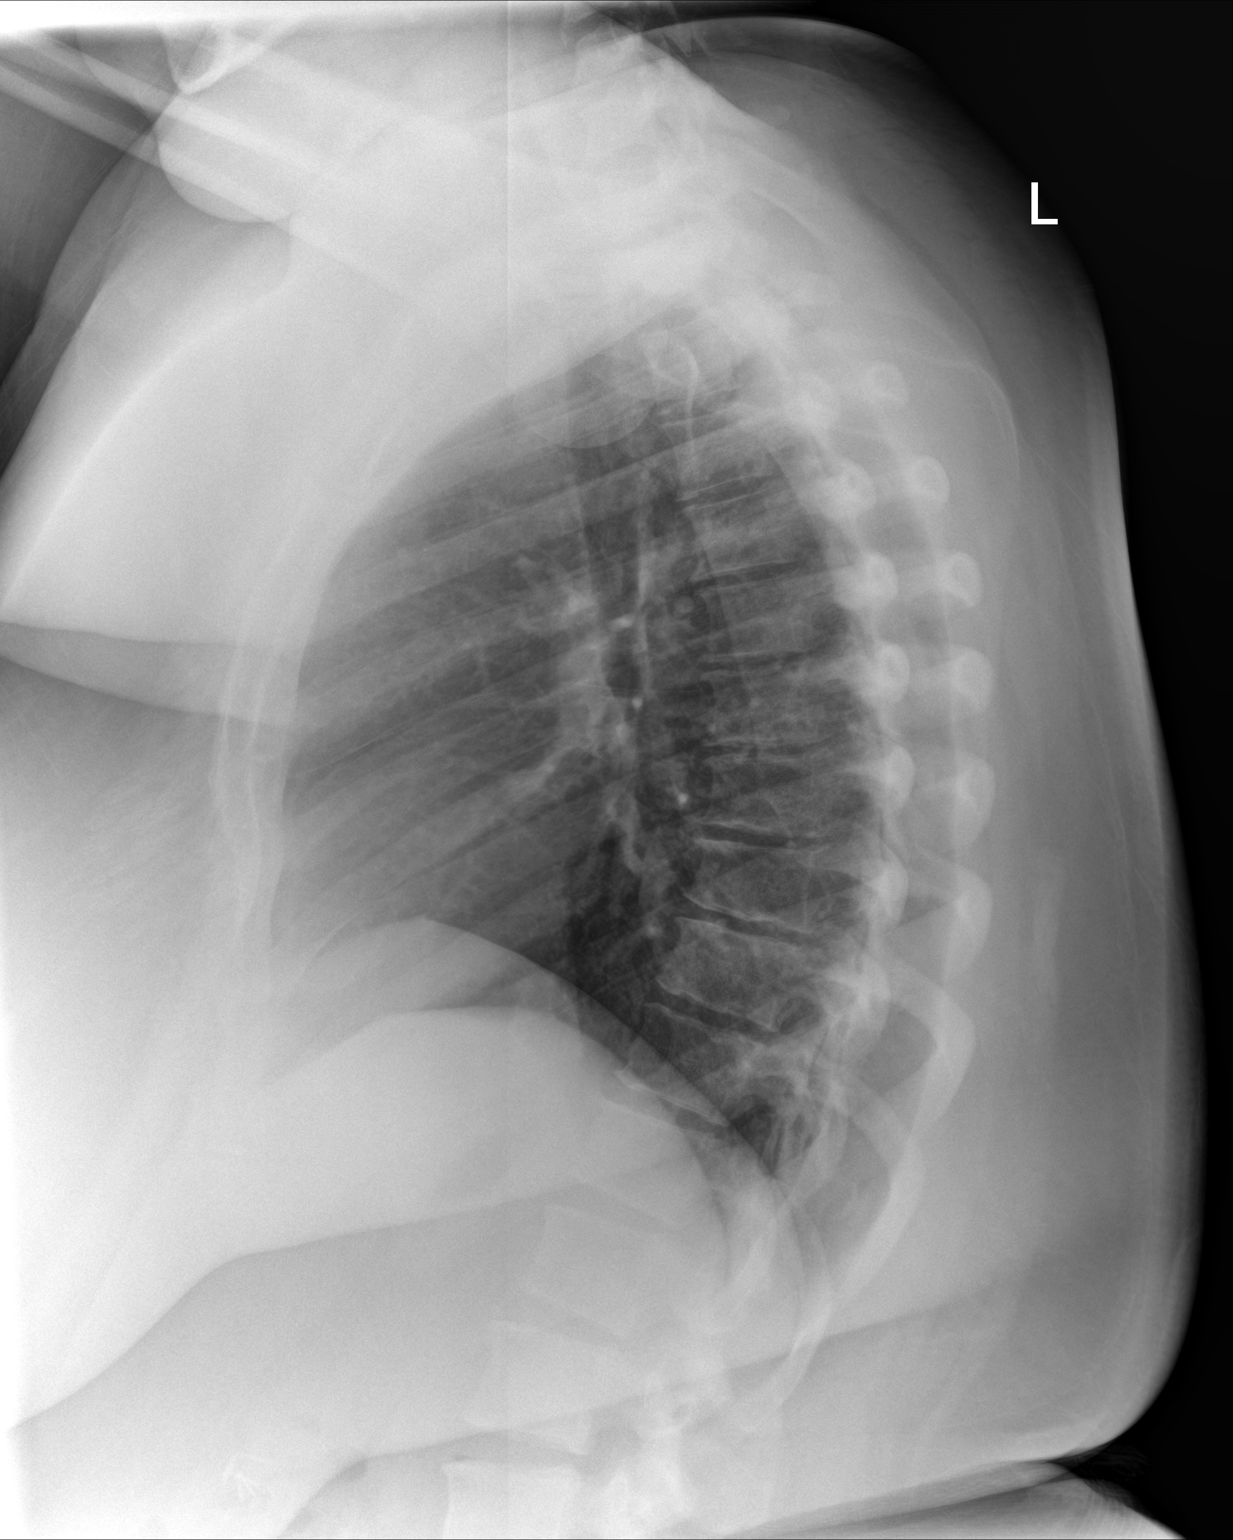

[2 of 2 positions shown; findings below may reference images not displayed]

FINDINGS: The heart size and mediastinal contours are within normal limits.
Both lungs are clear. The visualized skeletal structures are
unremarkable.
IMPRESSION: No active cardiopulmonary disease.

## 2016-04-08 ENCOUNTER — Encounter: Payer: Self-pay | Admitting: Dietician

## 2016-04-08 ENCOUNTER — Encounter: Payer: Medicare Other | Attending: General Surgery | Admitting: Dietician

## 2016-04-08 DIAGNOSIS — Z6841 Body Mass Index (BMI) 40.0 and over, adult: Secondary | ICD-10-CM | POA: Insufficient documentation

## 2016-04-08 DIAGNOSIS — E119 Type 2 diabetes mellitus without complications: Secondary | ICD-10-CM | POA: Insufficient documentation

## 2016-04-08 DIAGNOSIS — E669 Obesity, unspecified: Secondary | ICD-10-CM | POA: Insufficient documentation

## 2016-04-08 DIAGNOSIS — Z713 Dietary counseling and surveillance: Secondary | ICD-10-CM | POA: Insufficient documentation

## 2016-04-08 NOTE — Patient Instructions (Addendum)
Goals:  Follow Phase 3B: High Protein + Non-Starchy Vegetables  Eat 3-6 small meals/snacks, every 3-5 hrs  Increase lean protein foods to meet 60g goal  Start keeping protein snacks available (string cheese, deli meat, boiled egg, beef/turkey jerky, P3 packs, tuna packs, protein shake)  Try not to go more than 3-4 hours without eating  Start having Premier protein shake for breakfast  Aim for >30 min of physical activity daily  Keep focusing on non scale victories  Have unsweetened applesauce OR 1 serving of fruit on the days you exercise  Keep having protein shake for breakfast  Always include a protein with fruit (cottage cheese, cheese, deli meat, boiled egg, P3 packs)  Start taking vitamins again   Try Centrum complete  Take vitamins with food  Surgery date: 10/02/15 Surgery type: Sleeve gastrectomy [facility] weight at [facility]: 304 lbs on 04/17/15 Weight today: 225.5 lbs Weight change: 19 lbs Total weight lost: 78.5 lbs Weight goal: 175-180 lbs  TANITA  BODY COMP RESULTS  09/10/15 10/17/15 11/27/15 01/14/16 04/08/16   BMI (kg/m^2) N/A 51.9 49 46.2 42.6   Fat Mass (lbs)  147.0 133.5 123 109.5   Fat Free Mass (lbs)  127.0 126 121.5 116   Total Body Water (lbs)  93.5 92 89 85

## 2016-04-08 NOTE — Progress Notes (Signed)
  Follow-up visit: 6 months Post-Operative Sleeve gastrectomy Surgery  Medical Nutrition Therapy:  Appt start time: [id number] end time:  [id number]  Primary concerns today: Post-operative Bariatric Surgery Nutrition Management. Tiffay returns today having lost another 19 pounds. Feels like she needs to drink more water. Still feels low energy and lack of motivation. States that she has a stomach ulcer and some foods are irritating. Having trouble seeing her weight loss and has considered trying Herbalife and/or a green smoothie challenge. I explained to the patient that this is not recommended.   Non scale victories: more stamina, able to move faster   Surgery date: 10/02/15 Surgery type: Sleeve gastrectomy Start weight at [facility]: 304 lbs on 04/17/15 Weight today: 225.5 lbs Weight change: 19 lbs Total weight lost: 78.5 lbs Weight goal: 175-180 lbs  TANITA  BODY COMP RESULTS  09/10/15 10/17/15 11/27/15 01/14/16 04/08/16   BMI (kg/m^2) N/A 51.9 49 46.2 42.6   Fat Mass (lbs)  147.0 133.5 123 109.5   Fat Free Mass (lbs)  127.0 126 121.5 116   Total Body Water (lbs)  93.5 92 89 85    Preferred Learning Style:   No preference indicated   Learning Readiness:   Ready  24-hr recall: Wakes up around 5:30 am B (AM): skips sometimes; protein shake (0-30g) Snk (AM):   L (11AM-12PM): salad with lunch meat (14g) Snk (PM): sometimes beef sticks D (PM): 3 oz chicken or shrimp (21g) Snk (PM):   Fluid intake: 3-4 17-oz bottles water with sugar free flavoring Estimated total protein intake: 50-60 g per day  Medications: no longer on Metoprolol or Metformin; increased acid reflux medication Supplementation: not taking  Using straws: yes, not having gas pain Drinking while eating: no Hair loss: none Carbonated beverages: none N/V/D/C: acid reflux and constipation Dumping syndrome: none  Recent physical activity:  [employer] (treadmill and pool exercises) 2-3x a week for 30-60 minutes  Progress Towards  Goal(s):  In progress.   Nutritional Diagnosis:  [place]-3.3 Overweight/obesity related to past poor dietary habits and physical inactivity as evidenced by patient w/ recent sleeve gastrectomy surgery following dietary guidelines for continued weight loss.     Intervention:  Nutrition counseling provided.  Teaching Method Utilized:  Visual Auditory Hands on  Barriers to learning/adherence to lifestyle change: none  Demonstrated degree of understanding via:  Teach Back   Monitoring/Evaluation:  Dietary intake, exercise, and body weight. Follow up in 3 months for 9 month post-op visit.

## 2016-04-09 DIAGNOSIS — E010 Iodine-deficiency related diffuse (endemic) goiter: Secondary | ICD-10-CM | POA: Insufficient documentation

## 2016-04-09 DIAGNOSIS — K219 Gastro-esophageal reflux disease without esophagitis: Secondary | ICD-10-CM | POA: Insufficient documentation

## 2016-04-09 DIAGNOSIS — R1312 Dysphagia, oropharyngeal phase: Secondary | ICD-10-CM | POA: Insufficient documentation

## 2016-04-09 DIAGNOSIS — E042 Nontoxic multinodular goiter: Secondary | ICD-10-CM | POA: Insufficient documentation

## 2016-04-09 DIAGNOSIS — J358 Other chronic diseases of tonsils and adenoids: Secondary | ICD-10-CM | POA: Insufficient documentation

## 2016-04-10 ENCOUNTER — Ambulatory Visit: Payer: Medicare Other | Admitting: Family Medicine

## 2016-04-10 ENCOUNTER — Other Ambulatory Visit: Payer: Self-pay | Admitting: Otolaryngology

## 2016-04-10 DIAGNOSIS — E010 Iodine-deficiency related diffuse (endemic) goiter: Secondary | ICD-10-CM

## 2016-04-18 ENCOUNTER — Ambulatory Visit
Admission: RE | Admit: 2016-04-18 | Discharge: 2016-04-18 | Disposition: A | Discharge status: Home / Self Care | Payer: Medicare Other | Source: Ambulatory Visit | Attending: Otolaryngology | Admitting: Otolaryngology

## 2016-04-18 DIAGNOSIS — E010 Iodine-deficiency related diffuse (endemic) goiter: Secondary | ICD-10-CM

## 2016-04-22 ENCOUNTER — Other Ambulatory Visit: Payer: Self-pay | Admitting: Otolaryngology

## 2016-04-22 DIAGNOSIS — E041 Nontoxic single thyroid nodule: Secondary | ICD-10-CM

## 2016-04-24 ENCOUNTER — Inpatient Hospital Stay: Admission: RE | Admit: 2016-04-24 | Payer: Medicare Other | Source: Ambulatory Visit

## 2016-04-30 ENCOUNTER — Other Ambulatory Visit: Payer: Self-pay | Admitting: Otolaryngology

## 2016-05-01 ENCOUNTER — Ambulatory Visit
Admission: RE | Admit: 2016-05-01 | Discharge: 2016-05-01 | Disposition: A | Discharge status: Home / Self Care | Payer: Medicare Other | Source: Ambulatory Visit | Attending: Otolaryngology | Admitting: Otolaryngology

## 2016-05-01 ENCOUNTER — Other Ambulatory Visit (HOSPITAL_COMMUNITY)
Admission: RE | Admit: 2016-05-01 | Discharge: 2016-05-01 | Disposition: A | Discharge status: Home / Self Care | Payer: Medicare Other | Source: Ambulatory Visit | Attending: Radiology | Admitting: Radiology

## 2016-05-01 DIAGNOSIS — E041 Nontoxic single thyroid nodule: Secondary | ICD-10-CM

## 2016-06-01 ENCOUNTER — Ambulatory Visit (HOSPITAL_BASED_OUTPATIENT_CLINIC_OR_DEPARTMENT_OTHER): Payer: Medicare Other | Attending: Specialist | Admitting: Internal Medicine

## 2016-06-01 VITALS — Ht 60.0 in | Wt 213.0 lb

## 2016-06-01 DIAGNOSIS — E669 Obesity, unspecified: Secondary | ICD-10-CM | POA: Insufficient documentation

## 2016-06-01 DIAGNOSIS — I10 Essential (primary) hypertension: Secondary | ICD-10-CM | POA: Diagnosis not present

## 2016-06-01 DIAGNOSIS — G4733 Obstructive sleep apnea (adult) (pediatric): Secondary | ICD-10-CM | POA: Diagnosis present

## 2016-06-01 DIAGNOSIS — Z6841 Body Mass Index (BMI) 40.0 and over, adult: Secondary | ICD-10-CM | POA: Diagnosis not present

## 2016-06-01 DIAGNOSIS — G4763 Sleep related bruxism: Secondary | ICD-10-CM | POA: Insufficient documentation

## 2016-06-01 DIAGNOSIS — R0683 Snoring: Secondary | ICD-10-CM | POA: Diagnosis not present

## 2016-06-01 DIAGNOSIS — G4719 Other hypersomnia: Secondary | ICD-10-CM | POA: Insufficient documentation

## 2016-06-01 DIAGNOSIS — E119 Type 2 diabetes mellitus without complications: Secondary | ICD-10-CM | POA: Diagnosis not present

## 2016-06-01 DIAGNOSIS — F513 Sleepwalking [somnambulism]: Secondary | ICD-10-CM | POA: Insufficient documentation

## 2016-06-01 DIAGNOSIS — G4761 Periodic limb movement disorder: Secondary | ICD-10-CM | POA: Insufficient documentation

## 2016-06-09 ENCOUNTER — Ambulatory Visit: Payer: Medicare Other | Admitting: Family Medicine

## 2016-07-03 ENCOUNTER — Ambulatory Visit: Payer: Medicare Other | Admitting: Family Medicine

## 2016-07-05 NOTE — Procedures (Signed)
       Patient Name: Melinda Ruiz, Melinda Ruiz Date: 06/01/2016 Gender: Female D.O.B: 1981-04-10 Age (years): 34 Referring Provider: Lillia Corporal Height (inches): 36 Interpreting Physician: [name] MD, ABSM Weight (lbs): 213 RPSGT: Jonna Coup BMI: 42 MRN: [id number] Neck Size: 15.50 CLINICAL INFORMATION Sleep Study Type: NPSG Indication for sleep study: Diabetes, Excessive Daytime Sleepiness, Hypertension, Obesity, OSA, Sleep walking/talking/parasomnias, Witnessed Apneas Epworth Sleepiness Score: 11  SLEEP STUDY TECHNIQUE As per the AASM Manual for the Scoring of Sleep and Associated Events v2.3 (April 2016) with a hypopnea requiring 4% desaturations. The channels recorded and monitored were frontal, central and occipital EEG, electrooculogram (EOG), submentalis EMG (chin), nasal and oral airflow, thoracic and abdominal wall motion, anterior tibialis EMG, snore microphone, electrocardiogram, and pulse oximetry.  MEDICATIONS Patient's medications include: charted for review. Medications self-administered by patient during sleep study : No sleep medicine administered.  SLEEP ARCHITECTURE The study was initiated at 10:48:57 PM and ended at 4:49:53 AM. Sleep onset time was 10.8 minutes and the sleep efficiency was 93.0%. The total sleep time was 335.7 minutes. Stage REM latency was 71.5 minutes. The patient spent 2.09% of the night in stage N1 sleep, 78.85% in stage N2 sleep, 2.68% in stage N3 and 16.38% in REM. Alpha intrusion was absent. Supine sleep was 49.62%.  RESPIRATORY PARAMETERS The overall apnea/hypopnea index (AHI) was 2.0 per hour. There were 0 total apneas, including 0 obstructive, 0 central and 0 mixed apneas. There were 11 hypopneas and 7 RERAs. The AHI during Stage REM sleep was 9.8 per hour. AHI while supine was 4.0 per hour. The mean oxygen saturation was 97.43%. The minimum SpO2 during sleep was 91.00%. Soft snoring was noted during this  study.  CARDIAC DATA The 2 lead EKG demonstrated sinus rhythm. The mean heart rate was 81.05 beats per minute. Other EKG findings include: None.  LEG MOVEMENT DATA The total PLMS were 399 with a resulting PLMS index of 71.32. Associated arousal with leg movement index was 4.1 .  IMPRESSIONS - No significant obstructive sleep apnea occurred during this study (AHI = 2.0/h). - No significant central sleep apnea occurred during this study (CAI = 0.0/h). - The patient had minimal or no oxygen desaturation during the study (Min O2 = 91.00%) - The patient snored with Soft snoring volume. - No cardiac abnormalities were noted during this study. - Severe periodic limb movements of sleep occurred during the study. No significant associated arousals. 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 - Avoid alcohol, sedatives and other CNS depressants that may worsen sleep apnea and disrupt normal sleep architecture. - Sleep hygiene should be reviewed to assess factors that may improve sleep quality. - Weight management and regular exercise should be initiated or continued if appropriate.  Deneise Lever Diplomate, American Board of Sleep Medicine  ELECTRONICALLY SIGNED ON:  07/05/2016, 1:43 PM [place] PH: [phone]   FX: [phone] [place]

## 2016-07-08 ENCOUNTER — Encounter: Payer: Medicare Other | Attending: General Surgery | Admitting: Dietician

## 2016-07-08 DIAGNOSIS — Z6841 Body Mass Index (BMI) 40.0 and over, adult: Secondary | ICD-10-CM | POA: Diagnosis not present

## 2016-07-08 DIAGNOSIS — E119 Type 2 diabetes mellitus without complications: Secondary | ICD-10-CM | POA: Diagnosis not present

## 2016-07-08 DIAGNOSIS — E669 Obesity, unspecified: Secondary | ICD-10-CM | POA: Diagnosis present

## 2016-07-08 DIAGNOSIS — Z713 Dietary counseling and surveillance: Secondary | ICD-10-CM | POA: Insufficient documentation

## 2016-07-08 NOTE — Patient Instructions (Addendum)
Goals:  Follow Phase 3B: High Protein + Non-Starchy Vegetables  Eat 3-6 small meals/snacks, every 3-5 hrs  Increase lean protein foods to meet 60g goal  Try not to go more than 3-4 hours without eating  Aim for >30 min of physical activity daily  Keep focusing on non scale victories  Consider going back to your counselor  Have unsweetened applesauce OR 1 serving of fruit on the days you exercise  Have a boiled egg or yogurt for breakfast  Have diet cranberry juice  Start keeping a food log  Avoid going to the grocery store hungry  Bring a list to the store and stick to it  Avoid buying your trigger foods  Always include a protein with fruit (cottage cheese, cheese, deli meat, boiled egg, P3 packs)  Start taking vitamins again   Try Alisa Graff liquid vitamins ([place])  Get a sublingual B12   Take vitamins with food  Surgery date: 10/02/15 Surgery type: Sleeve gastrectomy Start weight at [facility]: 304 lbs on 04/17/15 Weight today: 231 lbs Weight change: 5.5 lbs gain Total weight lost: 73 lbs Weight goal: 180-200 lbs

## 2016-07-08 NOTE — Progress Notes (Signed)
  Follow-up visit: 9 months Post-Operative Sleeve gastrectomy Surgery  Medical Nutrition Therapy:  Appt start time: 1005 end time:  1040  Primary concerns today: Post-operative Bariatric Surgery Nutrition Management. Melinda Ruiz returns today having lost another 19 pounds. Feels like she needs to drink more water. Still feels low energy and lack of motivation. States that she has a stomach ulcer and some foods are irritating. Having trouble seeing her weight loss and has considered trying Herbalife and/or a green smoothie challenge. I explained to the patient that this is not recommended.   Feeling like she has been having more mood swings. Struggling with emotional eating and may have chips. Does not eat sweets. Able to tolerate more volume. Not taking vitamins.   Non scale victories: more stamina, able to move faster   Surgery date: 10/02/15 Surgery type: Sleeve gastrectomy Start weight at [facility]: 304 lbs on 04/17/15 Weight today: 231 lbs Weight change: 5.5 lbs gain Total weight lost: 73 lbs Weight goal: 180-200 lbs  TANITA  BODY COMP RESULTS  09/10/15 10/17/15 11/27/15 01/14/16 04/08/16 07/08/16   BMI (kg/m^2) N/A 51.9 49 46.2 42.6 43.6   Fat Mass (lbs)  147.0 133.5 123 109.5 113.2   Fat Free Mass (lbs)  127.0 126 121.5 116 117.8   Total Body Water (lbs)  93.5 92 89 85 86.4    Preferred Learning Style:   No preference indicated   Learning Readiness:   Ready  24-hr recall:  Wakes up around 5:30 am  B (AM): skips Snk (AM):   L (12PM): baked chicken or 1/2 [place] and cheese sandwich Snk (3-4 PM): chef salad  D (8-9 PM): baked chicken or fish and vegetables (21g) Snk (PM):   Goes to bed around 10:30-11 pm  Fluid intake: 3-4 17-oz bottles water with sugar free flavoring and G2, cranberry juice with water Estimated total protein intake: 50-60 g per day  Medications: no longer on Metoprolol or Metformin; increased acid reflux medication Supplementation: not taking  Using straws:  yes, not having gas pain Drinking while eating: no Hair loss: none Carbonated beverages: none N/V/D/C: none Dumping syndrome: none  Recent physical activity:  Walking 2 miles + 10 minutes ab exercises every day   Progress Towards Goal(s):  In progress.   Nutritional Diagnosis:  [place]-3.3 Overweight/obesity related to past poor dietary habits and physical inactivity as evidenced by patient w/ recent sleeve gastrectomy surgery following dietary guidelines for continued weight loss.     Intervention:  Nutrition counseling provided.  Teaching Method Utilized:  Visual Auditory Hands on  Barriers to learning/adherence to lifestyle change: none  Demonstrated degree of understanding via:  Teach Back   Monitoring/Evaluation:  Dietary intake, exercise, and body weight. Follow up in 3 months for 12 month post-op visit.

## 2016-07-09 ENCOUNTER — Encounter: Payer: Self-pay | Admitting: Dietician

## 2016-07-09 DIAGNOSIS — G4733 Obstructive sleep apnea (adult) (pediatric): Secondary | ICD-10-CM | POA: Diagnosis not present

## 2016-07-09 DIAGNOSIS — G4761 Periodic limb movement disorder: Secondary | ICD-10-CM | POA: Diagnosis not present

## 2016-07-25 ENCOUNTER — Ambulatory Visit: Payer: Medicare Other | Admitting: Family Medicine

## 2016-10-06 ENCOUNTER — Ambulatory Visit: Payer: Medicare Other | Admitting: Dietician

## 2016-12-25 ENCOUNTER — Ambulatory Visit: Payer: Medicare Other | Admitting: Dietician

## 2016-12-30 ENCOUNTER — Ambulatory Visit: Payer: Medicare Other | Admitting: Psychiatry

## 2017-01-02 ENCOUNTER — Encounter: Payer: Self-pay | Admitting: Dietician

## 2017-01-02 ENCOUNTER — Encounter: Payer: Medicare Other | Attending: General Surgery | Admitting: Dietician

## 2017-01-02 DIAGNOSIS — E669 Obesity, unspecified: Secondary | ICD-10-CM | POA: Insufficient documentation

## 2017-01-02 NOTE — Progress Notes (Signed)
  Follow-up visit: 15 months Post-Operative Sleeve gastrectomy Surgery  Medical Nutrition Therapy:  Appt start time: [id number] end time:  1020  Primary concerns today: Post-operative Bariatric Surgery Nutrition Management. Has gained 7.8 pounds. Finding it hard to adhere to diet recommendations. Saw Dr. [name] about a month ago and getting vitamin labwork done soon.  Non scale victories: more stamina, able to move faster   Surgery date: 10/02/15 Surgery type: Sleeve gastrectomy Start weight at [facility]: 304 lbs on 04/17/15 Weight today: 238.8 lbs Weight change: 7.8 lbs gain Total weight lost: 65 lbs Weight goal: 200 lbs  TANITA  BODY COMP RESULTS  09/10/15 10/17/15 11/27/15 01/14/16 04/08/16 07/08/16 01/02/17   BMI (kg/m^2) N/A 51.9 49 46.2 42.6 43.6 n/a   Fat Mass (lbs)  147.0 133.5 123 109.5 113.2    Fat Free Mass (lbs)  127.0 126 121.5 116 117.8    Total Body Water (lbs)  93.5 92 89 85 86.4     Preferred Learning Style:   No preference indicated   Learning Readiness:   Ready  24-hr recall:  Wakes up around 7-7:30 am  B (9-10AM): eggs and liver pudding OR cereal Snk (AM):   L (12PM): 1/2 [place] and cheese sandwich on white bread Snk (3-4 PM): applesauce, trail mix, or fruit D (8-9 PM): baked chicken or fish and vegetables and rice (21g) Snk (PM):   Goes to bed around 10:30-11 pm  Fluid intake: 3-4 17-oz bottles water with sugar free flavoring and G2, cranberry juice with water Estimated total protein intake: 50-60 g per day  Medications: no longer on Metoprolol or Metformin; increased acid reflux medication Supplementation: taking liquid form  Using straws: yes, not having gas pain Drinking while eating: no Hair loss: none Carbonated beverages: none N/V/D/C: none Dumping syndrome: none  Recent physical activity:  none  Progress Towards Goal(s):  In progress.   Nutritional Diagnosis:  [place]-3.3 Overweight/obesity related to past poor dietary habits and physical inactivity  as evidenced by patient w/ recent sleeve gastrectomy surgery following dietary guidelines for continued weight loss.     Intervention:  Nutrition counseling provided.  Teaching Method Utilized:  Visual Auditory Hands on  Barriers to learning/adherence to lifestyle change: none  Demonstrated degree of understanding via:  Teach Back   Monitoring/Evaluation:  Dietary intake, exercise, and body weight. Follow up in 1 month.

## 2017-01-02 NOTE — Patient Instructions (Addendum)
Goals:  Always eat your protein foods first!  Get back into exercise routine  Buy only wheat bread and brown rice  Keep using tiny plates and bowls  Continue to eat a high protein breakfast: eggs, coffee+protein shake  Get back into seeing your therapist (talk about feelings of guilt and food)  Try Smart Ones, [employer], or Healthy Choice (14+ grams of protein and less than 25 grams carbs)

## 2017-01-29 ENCOUNTER — Ambulatory Visit: Payer: Self-pay | Admitting: Dietician

## 2017-02-09 ENCOUNTER — Encounter: Payer: Medicare Other | Attending: General Surgery | Admitting: Skilled Nursing Facility1

## 2017-02-09 ENCOUNTER — Encounter: Payer: Self-pay | Admitting: Skilled Nursing Facility1

## 2017-02-09 DIAGNOSIS — E669 Obesity, unspecified: Secondary | ICD-10-CM

## 2017-02-09 DIAGNOSIS — E1169 Type 2 diabetes mellitus with other specified complication: Secondary | ICD-10-CM

## 2017-02-09 NOTE — Progress Notes (Signed)
  Follow-up visit: 15 months Post-Operative Sleeve gastrectomy Surgery  Medical Nutrition Therapy:  Appt start time: 10:15 end time:  10:50  Primary concerns today: Post-operative Bariatric Surgery Nutrition Management. Non scale victories: more stamina, able to move faster   Pt returns having lost about a pound. Pt states she is stress eating. Pt states she has started walking again to work on stress and is walking with someone that just had the surgery. Pt states she is Low in vitamin A (26). Pts 4.9 A1C. Pt states she is having dry skin on her face and has been struggling to see in her periphery. Pt states she has very low energy. Pt states she has not had a bowel movement in 1 month.   Surgery date: 10/02/15 Surgery type: Sleeve gastrectomy Start weight at [facility]: 304 lbs on 04/17/15 Weight today: 238 lbs Weight change: Maintained Weight goal: 200 lbs  TANITA  BODY COMP RESULTS  09/10/15 10/17/15 11/27/15 01/14/16 04/08/16 07/08/16 01/02/17   BMI (kg/m^2) N/A 51.9 49 46.2 42.6 43.6 n/a   Fat Mass (lbs)  147.0 133.5 123 109.5 113.2    Fat Free Mass (lbs)  127.0 126 121.5 116 117.8    Total Body Water (lbs)  93.5 92 89 85 86.4     Preferred Learning Style:   No preference indicated   Learning Readiness:   Ready  24-hr recall:  Wakes up around 7-7:30 am  B (9-10AM): none---eggs---liver pudding and grits Snk (AM):  chips L (12PM): 1/2 [place] and cheese sandwich on white bread Snk (3-4 PM): applesauce, trail mix, or fruit D (8-9 PM): baked chicken or fish and vegetables and brown rice (21g) Snk (PM):  Beverages: water, diluted juice  Goes to bed around 10:30-11 pm  Fluid intake: 1 17 ounce bottle of water, diluted juice Estimated total protein intake: 50-60 g per day  Medications: no longer on Metoprolol or Metformin; increased acid reflux medication Supplementation: taking centrum  Using straws: yes, not having gas pain Drinking while eating: YES Hair loss: YES Carbonated  beverages: NO N/V/D/C: YES: feels like her stomach is sore on the inside or an anxious feeling, NO, NO: have not had an bowel movement in one month  Recent physical activity:  none  Progress Towards Goal(s):  In progress.   Nutritional Diagnosis:  [place]-3.3 Overweight/obesity related to past poor dietary habits and physical inactivity as evidenced by patient w/ recent sleeve gastrectomy surgery following dietary guidelines for continued weight loss. 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  Teaching Method Utilized:  Visual Auditory Hands on  Barriers to learning/adherence to lifestyle change: none  Demonstrated degree of understanding via:  Teach Back   Monitoring/Evaluation:  Dietary intake, exercise, and body weight. Follow up in 1 month.

## 2017-02-09 NOTE — Patient Instructions (Addendum)
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

## 2017-03-10 ENCOUNTER — Ambulatory Visit: Payer: Self-pay | Admitting: Skilled Nursing Facility1

## 2017-03-13 ENCOUNTER — Other Ambulatory Visit (HOSPITAL_COMMUNITY)
Admission: RE | Admit: 2017-03-13 | Discharge: 2017-03-13 | Disposition: A | Discharge status: Home / Self Care | Payer: Medicare Other | Source: Ambulatory Visit | Attending: Obstetrics & Gynecology | Admitting: Obstetrics & Gynecology

## 2017-03-13 ENCOUNTER — Ambulatory Visit: Payer: Medicare Other

## 2017-03-13 DIAGNOSIS — N898 Other specified noninflammatory disorders of vagina: Secondary | ICD-10-CM | POA: Diagnosis present

## 2017-03-13 NOTE — Progress Notes (Signed)
Patient presented to office today  she reports having some vaginal discharge. Patient ws instructed on how to perform a self swab. I informed patient we will call her with her results on Monday. Patient voice understanding.

## 2017-03-16 LAB — CERVICOVAGINAL ANCILLARY ONLY
BACTERIAL VAGINITIS: POSITIVE — AB
Chlamydia: NEGATIVE
Neisseria Gonorrhea: NEGATIVE
Trichomonas: NEGATIVE

## 2017-03-18 ENCOUNTER — Telehealth: Payer: Self-pay | Admitting: General Practice

## 2017-03-18 DIAGNOSIS — B379 Candidiasis, unspecified: Secondary | ICD-10-CM

## 2017-03-18 DIAGNOSIS — N760 Acute vaginitis: Secondary | ICD-10-CM

## 2017-03-18 DIAGNOSIS — B9689 Other specified bacterial agents as the cause of diseases classified elsewhere: Secondary | ICD-10-CM

## 2017-03-18 MED ORDER — FLUCONAZOLE 150 MG PO TABS: 150.0000 mg | ORAL_TABLET | Freq: Once | ORAL | 0 refills | Status: AC

## 2017-03-18 MED ORDER — METRONIDAZOLE 500 MG PO TABS: 500.0000 mg | ORAL_TABLET | Freq: Two times a day (BID) | ORAL | 0 refills | Status: DC

## 2017-03-18 NOTE — Telephone Encounter (Signed)
Patient called and left message requesting test results. Per chart review, wet prep shows BV. Called patient & informed her of results and medication sent to pharmacy. Patient verbalized understanding & states since her visit here she has developed more of a discharge and vaginal itching. Told patient I will send another medication in for a yeast infection but to not take the medication until she finishes the flagyl. Also reminded patient to not consume alcohol while on the medicine. Patient verbalized understanding & had no questions

## 2017-04-02 ENCOUNTER — Other Ambulatory Visit (HOSPITAL_COMMUNITY)
Admission: RE | Admit: 2017-04-02 | Discharge: 2017-04-02 | Disposition: A | Discharge status: Home / Self Care | Payer: Medicare Other | Source: Ambulatory Visit | Attending: Family Medicine | Admitting: Family Medicine

## 2017-04-02 ENCOUNTER — Ambulatory Visit (INDEPENDENT_AMBULATORY_CARE_PROVIDER_SITE_OTHER): Payer: Medicare Other | Admitting: Family Medicine

## 2017-04-02 VITALS — BP 122/78 | HR 81 | Ht 61.0 in | Wt 244.1 lb

## 2017-04-02 DIAGNOSIS — Z124 Encounter for screening for malignant neoplasm of cervix: Secondary | ICD-10-CM | POA: Diagnosis present

## 2017-04-02 DIAGNOSIS — Z01419 Encounter for gynecological examination (general) (routine) without abnormal findings: Secondary | ICD-10-CM

## 2017-04-02 NOTE — Progress Notes (Signed)
GYNECOLOGY ANNUAL PREVENTATIVE CARE ENCOUNTER NOTE  Subjective:   Melinda Ruiz is a 36 y.o. G58P2012 female here for a routine annual gynecologic exam.  Current complaints: none.   Denies abnormal vaginal bleeding, discharge, pelvic pain, problems with intercourse or other gynecologic concerns.    Gynecologic History No LMP recorded. Patient is not sexually active Contraception: BTL Last Pap: years ago. Has history of abnormal PAP. Had biopsy, but unsure of results. Last mammogram: NA.   Obstetric History OB History  Gravida Para Term Preterm AB Living  3 2 2  0 1 2  SAB TAB Ectopic Multiple Live Births  1 0 0 0 2    # Outcome Date GA Lbr Len/2nd Weight Sex Delivery Anes PTL Lv  3 Term 12/13/10    F CS-Unspec Spinal  LIV     Birth Comments: repeat c/s  2 SAB 2008          1 Term 09/06/05    M CS-Unspec EPI  LIV     Birth Comments: c/s for nrfhr      Past Medical History:  Diagnosis Date  . Anemia    during pregancy  . Bipolar disorder ([place])   . Complication of anesthesia    "Oxygen level drops"  . Constipation   . Depression   . Diabetes mellitus without complication ([place])   . Family history of adverse reaction to anesthesia    thinks Mom has problem with oxygen level dropping  . Fibroid   . Headache(784.0)   . Hypertension    takes meds daily  . Kidney stones 2010  . Mental disorder    Bi poloar,  Schizohrenia- not taking meds.  Has been seen at [facility]  . Morbid obesity with BMI of 50.0-59.9, adult ([place])   . Schizophrenia [facility])     Past Surgical History:  Procedure Laterality Date  . BREATH TEK H PYLORI N/A 05/31/2015   Procedure: BREATH TEK H PYLORI;  Surgeon: Greer Pickerel, MD;  Location: [facility];  Service: General;  Laterality: N/A;  . CARPAL TUNNEL RELEASE  02/03/2012   Procedure: CARPAL TUNNEL RELEASE;  Surgeon: Meredith Pel, MD;  Location: [place];  Service: Orthopedics;  Laterality: Left;  left carpal tunnel release  . CARPAL  TUNNEL RELEASE  01/11/2013   Procedure: CARPAL TUNNEL RELEASE;  Surgeon: Meredith Pel, MD;  Location: [place];  Service: Orthopedics;  Laterality: Right;  Hand Set, Small Retractors, Lead Hand  . CESAREAN SECTION  2006, 2011  . CHOLECYSTECTOMY  06/2011  . cystectomy    . GANGLION CYST EXCISION  02/03/2012   Procedure: REMOVAL GANGLION OF WRIST;  Surgeon: Meredith Pel, MD;  Location: [place];  Service: Orthopedics;  Laterality: Left;  left dorsal ganglion cyst excision  . LAPAROSCOPIC GASTRIC SLEEVE RESECTION N/A 10/02/2015   Procedure: LAPAROSCOPIC GASTRIC SLEEVE RESECTION;  Surgeon: Greer Pickerel, MD;  Location: [facility];  Service: General;  Laterality: N/A;  . Lymp Glands Removed--under arm bil    . Lympectomy    . MULTIPLE TOOTH EXTRACTIONS  2003   and wisdom teeth  . Pilonidial Cyst Removed  1990's  . TUBAL LIGATION  2011  . UPPER GI ENDOSCOPY  10/02/2015   Procedure: UPPER GI ENDOSCOPY;  Surgeon: Greer Pickerel, MD;  Location: [facility];  Service: General;;    Current Outpatient Prescriptions on File Prior to Visit  Medication Sig Dispense Refill  . pantoprazole (PROTONIX) 20 MG tablet Take 20 mg by mouth daily.  Reported on 03/13/2016    . ondansetron (ZOFRAN-ODT) 4 MG disintegrating tablet Take 4 mg by mouth every 6 (six) hours as needed for nausea. Reported on 03/13/2016     No current facility-administered medications on file prior to visit.     Allergies  Allergen Reactions  . Penicillins Hives    .Has patient had a PCN reaction causing immediate rash, facial/tongue/throat swelling, SOB or lightheadedness with hypotension: No Has patient had a PCN reaction causing severe rash involving mucus membranes or skin necrosis: No Has patient had a PCN reaction that required hospitalization No Has patient had a PCN reaction occurring within the last 10 years: No If all of the above answers are "NO", then may proceed with Cephalosporin use.  Marland Kitchen Oxycodone-Acetaminophen Itching    Social  History   Social History  . Marital status: Single    Spouse name: N/A  . Number of children: N/A  . Years of education: N/A   Occupational History  . Not on file.   Social History Main Topics  . Smoking status: Former Smoker    Packs/day: 0.25    Types: Cigarettes    Quit date: 10/01/2009  . Smokeless tobacco: Never Used  . Alcohol use No  . Drug use: No  . Sexual activity: Yes    Birth control/ protection: Injection   Other Topics Concern  . Not on file   Social History Narrative  . No narrative on file    Family History  Problem Relation Age of Onset  . Kidney cancer Father   . Cancer Father   . Diabetes Mellitus II Father   . Hypertension Father   . Arthritis Mother   . Cancer Mother   . Diabetes Mellitus II Mother   . Hypertension Mother   . Anesthesia problems Neg Hx     The following portions of the patient's history were reviewed and updated as appropriate: allergies, current medications, past family history, past medical history, past social history, past surgical history and problem list.  Review of Systems Pertinent items noted in HPI and remainder of comprehensive ROS otherwise negative.   Objective:  BP 122/78   Pulse 81   Ht 5\' 1"  (1.549 m)   Wt 244 lb 1.6 oz (110.7 kg)   BMI 46.12 kg/m  CONSTITUTIONAL: Well-developed, well-nourished female in no acute distress.  HENT:  Normocephalic, atraumatic, External right and left ear normal. Oropharynx is clear and moist EYES: Conjunctivae and EOM are normal. Pupils are equal, round, and reactive to light. No scleral icterus.  NECK: Normal range of motion, supple, no masses.  Normal thyroid.   CARDIOVASCULAR: Normal heart rate noted, regular rhythm RESPIRATORY: Clear to auscultation bilaterally. Effort and breath sounds normal, no problems with respiration noted. BREASTS: Symmetric in size. No masses, skin changes, nipple drainage, or lymphadenopathy. ABDOMEN: Soft, normal bowel sounds, no distention  noted.  No tenderness, rebound or guarding.  PELVIC: Normal appearing external genitalia; normal appearing vaginal mucosa and cervix.  No abnormal discharge noted.  Pap smear obtained.  Normal uterine size, no other palpable masses, no uterine or adnexal tenderness. MUSCULOSKELETAL: Normal range of motion. No tenderness.  No cyanosis, clubbing, or edema.  2+ distal pulses. SKIN: Skin is warm and dry. No rash noted. Not diaphoretic. No erythema. No pallor. NEUROLOGIC: Alert and oriented to person, place, and time. Normal reflexes, muscle tone coordination. No cranial nerve deficit noted. PSYCHIATRIC: Normal mood and affect. Normal behavior. Normal judgment and thought content.  Assessment:  Annual  gynecologic examination with pap smear   Plan:  Will follow up results of pap smear and manage accordingly. STD testing discussed. Patient declined testing Discussed exercise and diet Calcium/Vitamin D Supplementation  Routine preventative health maintenance measures emphasized. Please refer to After Visit Summary for other counseling recommendations.    Loma Boston, [place] for [employer]

## 2017-04-07 LAB — CYTOLOGY - PAP
DIAGNOSIS: NEGATIVE
HPV (WINDOPATH): NOT DETECTED

## 2017-06-01 ENCOUNTER — Other Ambulatory Visit: Payer: Self-pay | Admitting: Ophthalmology

## 2017-06-01 DIAGNOSIS — R519 Headache, unspecified: Secondary | ICD-10-CM

## 2017-06-01 DIAGNOSIS — R51 Headache: Principal | ICD-10-CM

## 2017-06-12 ENCOUNTER — Ambulatory Visit
Admission: RE | Admit: 2017-06-12 | Discharge: 2017-06-12 | Disposition: A | Discharge status: Home / Self Care | Payer: Medicare Other | Source: Ambulatory Visit | Attending: Ophthalmology | Admitting: Ophthalmology

## 2017-06-12 DIAGNOSIS — R51 Headache: Principal | ICD-10-CM

## 2017-06-12 DIAGNOSIS — R519 Headache, unspecified: Secondary | ICD-10-CM

## 2017-06-12 MED ORDER — GADOBENATE DIMEGLUMINE 529 MG/ML IV SOLN
20.0000 mL | Freq: Once | INTRAVENOUS | Status: AC | PRN
Start: 2017-06-12 — End: 2017-06-12
  Administered 2017-06-12: 20 mL via INTRAVENOUS

## 2017-06-15 ENCOUNTER — Inpatient Hospital Stay
Admission: RE | Admit: 2017-06-15 | Discharge: 2017-06-15 | Disposition: A | Discharge status: Home / Self Care | Payer: Self-pay | Source: Ambulatory Visit | Attending: Ophthalmology | Admitting: Ophthalmology

## 2017-07-30 ENCOUNTER — Encounter: Payer: Self-pay | Admitting: Neurology

## 2017-07-30 ENCOUNTER — Ambulatory Visit (INDEPENDENT_AMBULATORY_CARE_PROVIDER_SITE_OTHER): Payer: Medicare Other | Admitting: Neurology

## 2017-07-30 ENCOUNTER — Other Ambulatory Visit: Payer: Self-pay | Admitting: *Deleted

## 2017-07-30 VITALS — BP 122/74 | HR 95 | Ht 61.0 in | Wt 247.0 lb

## 2017-07-30 DIAGNOSIS — E1169 Type 2 diabetes mellitus with other specified complication: Secondary | ICD-10-CM | POA: Diagnosis not present

## 2017-07-30 DIAGNOSIS — G935 Compression of brain: Secondary | ICD-10-CM

## 2017-07-30 DIAGNOSIS — E669 Obesity, unspecified: Secondary | ICD-10-CM

## 2017-07-30 DIAGNOSIS — G43709 Chronic migraine without aura, not intractable, without status migrainosus: Secondary | ICD-10-CM | POA: Diagnosis not present

## 2017-07-30 DIAGNOSIS — IMO0002 Reserved for concepts with insufficient information to code with codable children: Secondary | ICD-10-CM | POA: Insufficient documentation

## 2017-07-30 MED ORDER — ONDANSETRON 4 MG PO TBDP: 4.0000 mg | ORAL_TABLET | Freq: Three times a day (TID) | ORAL | 6 refills | Status: DC | PRN

## 2017-07-30 MED ORDER — RIZATRIPTAN BENZOATE 10 MG PO TBDP: 10.0000 mg | ORAL_TABLET | ORAL | 6 refills | Status: DC | PRN

## 2017-07-30 MED ORDER — ZONISAMIDE 100 MG PO CAPS: 100.0000 mg | ORAL_CAPSULE | Freq: Every day | ORAL | 11 refills | Status: DC

## 2017-07-30 NOTE — Progress Notes (Signed)
PATIENT: Melinda Ruiz DOB: 05-13-1981  Chief Complaint  Patient presents with  . Headaches    Reports headaches nearly daily along with vision disturbances.  She is currently using Tylenol to treat her pain.  She would like to further discuss her recent brain scans, ordered by her ophthalmologist, that revealed Chiari 1 Malformation.  Marland Kitchen Ophthalmologist    Melinda Cotton, MD - referring MD  . 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 initial evaluation was on July 30 2017.  She has past medical history of mood disorder, hypertension, kidney stone, Obesity, status post bariatric surgery in 2016, with 80 pound weight loss.  She had a history of migraine headaches since high school, her typical migraine headaches are left side severe pounding headache with associated light noise sensitivity, nauseous, lasting for a few hours, sleep, Tylenol is helpful  Trigger for her migraines are stress, weather change, hungry, menstruation, bright light, loud noise, she used to have migraine few times each months, but she complains of increased migraine since 2017, become almost daily bases, she has been taking Tylenol daily for few months.  She also complains of blurry vision since 2018, despite changing classes, she has difficulty focusing sometimes, she was seen by optometrist, eventually referred to Dr. Unk Lightning reviewed and summarized the most recent ophthalmology evaluation on June 22 2017, there was description of bilateral irregular margin elevation  We have personally reviewed MRI of the brain on June sixth 2018, Chiari I malformation with the cerebellar tonsils positioned up to 11 mm through the foramen magnum.  No evidence of compression,  Normal intracranial MRV.   REVIEW OF SYSTEMS: Full 14 system review of systems performed and notable only for confusion, headaches, depression, not enough sleep, blurred vision, constipation, sleepiness  ALLERGIES: Allergies  Allergen Reactions  . Penicillins Hives    .Has patient had a PCN reaction causing immediate rash, facial/tongue/throat swelling, SOB or lightheadedness with hypotension: No Has patient had a PCN reaction causing severe rash involving mucus membranes or skin necrosis: No Has patient had a PCN reaction that required hospitalization No Has patient had a PCN reaction occurring within the last 10 years: No If all of the above answers are "NO", then may proceed with Cephalosporin use.  Marland Kitchen Oxycodone-Acetaminophen Itching    HOME MEDICATIONS: Current Outpatient Prescriptions  Medication Sig Dispense Refill  . Acetaminophen (TYLENOL PO) Take by mouth as needed.    . pantoprazole (PROTONIX) 20 MG tablet Take 20 mg by mouth daily. Reported on 03/13/2016    . sucralfate (CARAFATE) 1 g tablet TAKE 1 TABLET BY MOUTH EVERY 6 HOURS AS NEEDED. can crush and mix with water or applesauce if needed.     No current facility-administered medications for this visit.     PAST MEDICAL HISTORY: Past Medical History:  Diagnosis Date  . Anemia    during pregancy  . Bipolar disorder ([place])   . Chiari I malformation ([place])   . Complication of anesthesia    "Oxygen level drops"  . Constipation   . Depression   . Diabetes mellitus without complication ([place])   . Family history of adverse reaction to anesthesia    thinks Mom has problem with oxygen level dropping  . Fibroid   .  Headache(784.0)   . Hypertension    takes meds daily  . Kidney stones 2010  . Mental disorder    Bi poloar,  Schizohrenia- not taking meds.  Has been seen at [facility]  . Morbid obesity with BMI of 50.0-59.9, adult ([place])   . Schizophrenia ([place])     PAST SURGICAL HISTORY: Past Surgical History:    Procedure Laterality Date  . BREATH TEK H PYLORI N/A 05/31/2015   Procedure: BREATH TEK H PYLORI;  Surgeon: Greer Pickerel, MD;  Location: [facility];  Service: General;  Laterality: N/A;  . CARPAL TUNNEL RELEASE  02/03/2012   Procedure: CARPAL TUNNEL RELEASE;  Surgeon: Meredith Pel, MD;  Location: [place];  Service: Orthopedics;  Laterality: Left;  left carpal tunnel release  . CARPAL TUNNEL RELEASE  01/11/2013   Procedure: CARPAL TUNNEL RELEASE;  Surgeon: Meredith Pel, MD;  Location: [place];  Service: Orthopedics;  Laterality: Right;  Hand Set, Small Retractors, Lead Hand  . CESAREAN SECTION  2006, 2011  . CHOLECYSTECTOMY  06/2011  . cystectomy    . GANGLION CYST EXCISION  02/03/2012   Procedure: REMOVAL GANGLION OF WRIST;  Surgeon: Meredith Pel, MD;  Location: [place];  Service: Orthopedics;  Laterality: Left;  left dorsal ganglion cyst excision  . LAPAROSCOPIC GASTRIC SLEEVE RESECTION N/A 10/02/2015   Procedure: LAPAROSCOPIC GASTRIC SLEEVE RESECTION;  Surgeon: Greer Pickerel, MD;  [facility];  Service: General;  Laterality: N/A;  . Lymp Glands Removed--under arm bil    . Lympectomy    . MULTIPLE TOOTH EXTRACTIONS  2003   and wisdom teeth  . Pilonidial Cyst Removed  1990's  . TUBAL LIGATION  2011  . UPPER GI ENDOSCOPY  10/02/2015   Procedure: UPPER GI ENDOSCOPY;  Surgeon: Greer Pickerel, MD;  [facility];  Service: General;;    FAMILY HISTORY: Family History  Problem Relation Age of Onset  . Kidney cancer Father   . Diabetes Mellitus II Father   . Hypertension Father   . Arthritis Mother   . Diabetes Mellitus II Mother   . Hypertension Mother   . Anesthesia problems Neg Hx     SOCIAL HISTORY:  Social History   Social History  . Marital status: Single    Spouse name: N/A  . Number of children: 2  . Years of education: 12   Occupational History  . Dealer    Social History Main Topics  . Smoking status: Former Smoker    Packs/day: 0.25     Types: Cigarettes    Quit date: 10/01/2009  . Smokeless tobacco: Never Used  . Alcohol use Yes     Comment: has a drink twice weekly  . Drug use: No  . Sexual activity: Yes    Birth control/ protection: Injection   Other Topics Concern  . Not on file   Social History Narrative   Lives at home with children.   Right-handed.   No caffeine use.     PHYSICAL EXAM   Vitals:   07/30/17 0822  BP: 122/74  Pulse: 95  Weight: 247 lb (112 kg)  Height: 5\' 1"  (1.549 m)    Not recorded      Body mass index is 46.67 kg/m.  PHYSICAL EXAMNIATION:  Gen: NAD, conversant, well nourised, obese, well groomed                     Cardiovascular: Regular rate rhythm, no peripheral edema, warm, nontender.  Eyes: Conjunctivae clear without exudates or hemorrhage Neck: Supple, no carotid bruits. Pulmonary: Clear to auscultation bilaterally   NEUROLOGICAL EXAM:  MENTAL STATUS: Speech:    Speech is normal; fluent and spontaneous with normal comprehension.  Cognition:     Orientation to time, place and person     Normal recent and remote memory     Normal Attention span and concentration     Normal Language, naming, repeating,spontaneous speech     Fund of knowledge   CRANIAL NERVES: CN II: Visual fields are full to confrontation. Fundoscopic exam Mild blurry disc edge, I was not able to appreciate venous pulsations. Pupils are round equal and briskly reactive to light. CN III, IV, VI: extraocular movement are normal. No ptosis. CN V: Facial sensation is intact to pinprick in all 3 divisions bilaterally. Corneal responses are intact.  CN VII: Face is symmetric with normal eye closure and smile. CN VIII: Hearing is normal to rubbing fingers CN IX, X: Palate elevates symmetrically. Phonation is normal. CN XI: Head turning and shoulder shrug are intact CN XII: Tongue is midline with normal movements and no atrophy.  MOTOR: There is no pronator drift of out-stretched arms. Muscle  bulk and tone are normal. Muscle strength is normal.  REFLEXES: Reflexes are 2+ and symmetric at the biceps, triceps, knees, and ankles. Plantar responses are flexor.  SENSORY: Intact to light touch, pinprick, positional sensation and vibratory sensation are intact in fingers and toes.  COORDINATION: Rapid alternating movements and fine finger movements are intact. There is no dysmetria on finger-to-nose and heel-knee-shin.    GAIT/STANCE: Posture is normal. Gait is steady with normal steps, base, arm swing, and turning. Heel and toe walking are normal. Tandem gait is normal.  Romberg is absent.   DIAGNOSTIC DATA (LABS, IMAGING, TESTING) - I reviewed patient records, labs, notes, testing and imaging myself where available.   ASSESSMENT AND PLAN  Taytem Ghattas Rodd is a 36 y.o. female   Chronic migraine headache No Chiari malformation type I Possible papillary edema, intracranial hypertension  Referred to lumbar puncture, checked (pressure  Zonegran 100 mg twice a day as migraine prevention  Maxalt and Zofran as needed   Marcial Pacas, M.D. Ph.D.  [facility] [address], [place], [place] [address] Ph: [phone] Fax: [phone]  CC: Melinda Cotton, MD, Pavelock, Ralene Bathe, MD

## 2017-07-31 MED FILL — RIZATRIPTAN 10 MG ODT: 10 | 18 days supply | Qty: 9 | Fill #0

## 2017-07-31 MED FILL — ZONISAMIDE 100 MG CAPSULE: 100 | 60 days supply | Qty: 60 | Fill #0

## 2017-07-31 MED FILL — ONDANSETRON ODT 4 MG TABLET: 4 | 6 days supply | Qty: 20 | Fill #0

## 2017-08-12 ENCOUNTER — Inpatient Hospital Stay
Admission: RE | Admit: 2017-08-12 | Discharge: 2017-08-12 | Disposition: A | Discharge status: Home / Self Care | Payer: Self-pay | Source: Ambulatory Visit | Attending: Neurology | Admitting: Neurology

## 2017-08-12 ENCOUNTER — Other Ambulatory Visit: Payer: Self-pay | Admitting: Obstetrics and Gynecology

## 2017-08-12 ENCOUNTER — Other Ambulatory Visit (HOSPITAL_COMMUNITY)
Admission: RE | Admit: 2017-08-12 | Discharge: 2017-08-12 | Disposition: A | Discharge status: Home / Self Care | Payer: Medicare Other | Source: Ambulatory Visit | Attending: Obstetrics and Gynecology | Admitting: Obstetrics and Gynecology

## 2017-08-12 ENCOUNTER — Ambulatory Visit (INDEPENDENT_AMBULATORY_CARE_PROVIDER_SITE_OTHER): Payer: Medicare Other

## 2017-08-12 DIAGNOSIS — Z113 Encounter for screening for infections with a predominantly sexual mode of transmission: Secondary | ICD-10-CM | POA: Diagnosis present

## 2017-08-12 DIAGNOSIS — N898 Other specified noninflammatory disorders of vagina: Secondary | ICD-10-CM

## 2017-08-12 DIAGNOSIS — B373 Candidiasis of vulva and vagina: Secondary | ICD-10-CM | POA: Diagnosis not present

## 2017-08-12 DIAGNOSIS — B9689 Other specified bacterial agents as the cause of diseases classified elsewhere: Secondary | ICD-10-CM | POA: Insufficient documentation

## 2017-08-12 DIAGNOSIS — Z7689 Persons encountering health services in other specified circumstances: Secondary | ICD-10-CM

## 2017-08-12 DIAGNOSIS — Z01419 Encounter for gynecological examination (general) (routine) without abnormal findings: Secondary | ICD-10-CM | POA: Diagnosis present

## 2017-08-12 NOTE — Progress Notes (Signed)
Patient presented to the office for discharge with a fishy odor. Patient did a self swab and also requested STD tests. Explained to patient that we will call her with results. Patient verbalized understanding and had no questions.

## 2017-08-13 LAB — CERVICOVAGINAL ANCILLARY ONLY
BACTERIAL VAGINITIS: POSITIVE — AB
Candida vaginitis: POSITIVE — AB
Chlamydia: NEGATIVE
NEISSERIA GONORRHEA: NEGATIVE
Trichomonas: NEGATIVE

## 2017-08-13 LAB — HIV ANTIBODY (ROUTINE TESTING W REFLEX): HIV Screen 4th Generation wRfx: NONREACTIVE

## 2017-08-13 LAB — RPR QUALITATIVE: RPR: NONREACTIVE

## 2017-08-14 ENCOUNTER — Other Ambulatory Visit: Payer: Self-pay | Admitting: Obstetrics and Gynecology

## 2017-08-14 MED ORDER — MICONAZOLE NITRATE 2 % VA CREA: 1.0000 | TOPICAL_CREAM | Freq: Every day | VAGINAL | 2 refills | Status: DC

## 2017-08-14 MED ORDER — METRONIDAZOLE 500 MG PO TABS: 500.0000 mg | ORAL_TABLET | Freq: Two times a day (BID) | ORAL | 1 refills | Status: AC

## 2017-08-14 MED FILL — metroNIDAZOLE 500 MG TABS: 500 | 7 days supply | Qty: 14 | Fill #0

## 2017-08-21 ENCOUNTER — Ambulatory Visit
Admission: RE | Admit: 2017-08-21 | Discharge: 2017-08-21 | Disposition: A | Discharge status: Home / Self Care | Payer: Medicare Other | Source: Ambulatory Visit | Attending: Neurology | Admitting: Neurology

## 2017-08-21 ENCOUNTER — Telehealth: Payer: Self-pay | Admitting: Neurology

## 2017-08-21 VITALS — BP 125/83 | HR 77

## 2017-08-21 DIAGNOSIS — E669 Obesity, unspecified: Secondary | ICD-10-CM

## 2017-08-21 DIAGNOSIS — G935 Compression of brain: Secondary | ICD-10-CM

## 2017-08-21 DIAGNOSIS — E1169 Type 2 diabetes mellitus with other specified complication: Secondary | ICD-10-CM

## 2017-08-21 DIAGNOSIS — G43709 Chronic migraine without aura, not intractable, without status migrainosus: Secondary | ICD-10-CM

## 2017-08-21 DIAGNOSIS — IMO0002 Reserved for concepts with insufficient information to code with codable children: Secondary | ICD-10-CM

## 2017-08-21 DIAGNOSIS — Z6841 Body Mass Index (BMI) 40.0 and over, adult: Principal | ICD-10-CM

## 2017-08-21 LAB — CSF CELL COUNT WITH DIFFERENTIAL
RBC COUNT CSF: 1 {cells}/uL (ref 0–10)
WBC CSF: 0 {cells}/uL (ref 0–5)

## 2017-08-21 LAB — GLUCOSE, CSF: GLUCOSE CSF: 54 mg/dL (ref 43–76)

## 2017-08-21 LAB — GRAM STAIN
Gram Stain: NONE SEEN
Gram Stain: NONE SEEN

## 2017-08-21 LAB — PROTEIN, CSF: TOTAL PROTEIN, CSF: 28 mg/dL (ref 15–45)

## 2017-08-21 NOTE — Discharge Instructions (Signed)

## 2017-08-21 NOTE — Telephone Encounter (Signed)
[facility] lab called for CSF labs.   I called back to the lab and they stated that so far CSF partial/preliminary results showed no abnormalities. They will fax the CSF result to [facility] attention to Dr. Krista Blue. I thanked for their effort.   Rosalin Hawking, MD PhD Stroke Neurology 08/21/2017 6:02 PM

## 2017-08-23 LAB — VDRL, CSF: SYPHILIS VDRL QUANT CSF: NONREACTIVE

## 2017-09-14 ENCOUNTER — Encounter (HOSPITAL_COMMUNITY): Payer: Self-pay | Admitting: Emergency Medicine

## 2017-09-14 ENCOUNTER — Emergency Department (HOSPITAL_COMMUNITY)
Admission: EM | Admit: 2017-09-14 | Discharge: 2017-09-14 | Disposition: A | Discharge status: Home / Self Care | Payer: Medicare Other | Attending: Emergency Medicine | Admitting: Emergency Medicine

## 2017-09-14 DIAGNOSIS — Z79899 Other long term (current) drug therapy: Secondary | ICD-10-CM | POA: Insufficient documentation

## 2017-09-14 DIAGNOSIS — E119 Type 2 diabetes mellitus without complications: Secondary | ICD-10-CM | POA: Insufficient documentation

## 2017-09-14 DIAGNOSIS — Q0700 Arnold-Chiari syndrome without spina bifida or hydrocephalus: Secondary | ICD-10-CM | POA: Diagnosis not present

## 2017-09-14 DIAGNOSIS — R197 Diarrhea, unspecified: Secondary | ICD-10-CM | POA: Diagnosis present

## 2017-09-14 DIAGNOSIS — Z87891 Personal history of nicotine dependence: Secondary | ICD-10-CM | POA: Diagnosis not present

## 2017-09-14 DIAGNOSIS — K529 Noninfective gastroenteritis and colitis, unspecified: Secondary | ICD-10-CM | POA: Insufficient documentation

## 2017-09-14 DIAGNOSIS — I10 Essential (primary) hypertension: Secondary | ICD-10-CM | POA: Diagnosis not present

## 2017-09-14 LAB — CBC
HCT: 43.1 % (ref 36.0–46.0)
Hemoglobin: 14.4 g/dL (ref 12.0–15.0)
MCH: 28.9 pg (ref 26.0–34.0)
MCHC: 33.4 g/dL (ref 30.0–36.0)
MCV: 86.4 fL (ref 78.0–100.0)
PLATELETS: 214 10*3/uL (ref 150–400)
RBC: 4.99 MIL/uL (ref 3.87–5.11)
RDW: 14.5 % (ref 11.5–15.5)
WBC: 6.5 10*3/uL (ref 4.0–10.5)

## 2017-09-14 LAB — COMPREHENSIVE METABOLIC PANEL
ALK PHOS: 88 U/L (ref 38–126)
ALT: 13 U/L — AB (ref 14–54)
ANION GAP: 13 (ref 5–15)
AST: 23 U/L (ref 15–41)
Albumin: 4.1 g/dL (ref 3.5–5.0)
BUN: 11 mg/dL (ref 6–20)
CO2: 17 mmol/L — ABNORMAL LOW (ref 22–32)
CREATININE: 0.66 mg/dL (ref 0.44–1.00)
Calcium: 8.9 mg/dL (ref 8.9–10.3)
Chloride: 108 mmol/L (ref 101–111)
Glucose, Bld: 53 mg/dL — ABNORMAL LOW (ref 65–99)
Potassium: 4.8 mmol/L (ref 3.5–5.1)
Sodium: 138 mmol/L (ref 135–145)
Total Bilirubin: 0.9 mg/dL (ref 0.3–1.2)
Total Protein: 8.1 g/dL (ref 6.5–8.1)

## 2017-09-14 LAB — URINALYSIS, ROUTINE W REFLEX MICROSCOPIC
BACTERIA UA: NONE SEEN
Bilirubin Urine: NEGATIVE
Glucose, UA: NEGATIVE mg/dL
HGB URINE DIPSTICK: NEGATIVE
Ketones, ur: NEGATIVE mg/dL
Leukocytes, UA: NEGATIVE
NITRITE: NEGATIVE
PROTEIN: NEGATIVE mg/dL
Specific Gravity, Urine: 1.023 (ref 1.005–1.030)
Squamous Epithelial / LPF: NONE SEEN
pH: 5 (ref 5.0–8.0)

## 2017-09-14 LAB — LIPASE, BLOOD: Lipase: 25 U/L (ref 11–51)

## 2017-09-14 LAB — C DIFFICILE QUICK SCREEN W PCR REFLEX
C DIFFICLE (CDIFF) ANTIGEN: NEGATIVE
C Diff interpretation: NOT DETECTED
C Diff toxin: NEGATIVE

## 2017-09-14 MED ORDER — SODIUM CHLORIDE 0.9 % IV BOLUS (SEPSIS)
2000.0000 mL | Freq: Once | INTRAVENOUS | Status: AC
  Administered 2017-09-14: 2000 mL via INTRAVENOUS

## 2017-09-14 MED ORDER — ONDANSETRON 8 MG PO TBDP: 8.0000 mg | ORAL_TABLET | Freq: Three times a day (TID) | ORAL | 0 refills | Status: DC | PRN

## 2017-09-14 MED ORDER — SODIUM CHLORIDE 0.9 % IV SOLN
INTRAVENOUS | Status: DC
  Administered 2017-09-14: 15:00:00 via INTRAVENOUS

## 2017-09-14 NOTE — ED Notes (Signed)
Deandra, NT attempted to draw blood for blood samples. Obtained a light green top and sent to main lab. Spoke with Verdis Frederickson, phlebotomy to see about obtaining blood samples. She reports she will come down.

## 2017-09-14 NOTE — ED Notes (Signed)
Their has been three different staff members to attempt to obtain blood but unable to. Informed Dr. Madison Hickman of the unavailability to obtain blood. Deandra, NT will attempt to get blood at 13:00 after fluids have infused.

## 2017-09-14 NOTE — ED Triage Notes (Signed)
Per patient states she works in [employer] she might have been exposed to C-Diff-states watery stools and vomitting

## 2017-09-14 NOTE — ED Provider Notes (Signed)
[place] DEPT Provider Note   CSN: [id number] Arrival date & time: 09/14/17  1009     History   Chief Complaint Chief Complaint  Patient presents with  . Diarrhea    HPI Melinda Ruiz is a 36 y.o. female.  36 year old female who presents with several-day history of watery diarrhea with abdominal cramping as nonbilious emesis. Has subjective myalgias without cough or congestion. No recent travel history. No recent antibiotic usage. Does work in Water [employer] and was exposed possibly to C. Difficile colitis. She has no other sick exposures and is concerned that she may have this illness. Denies any weakness with standing. No treatment used prior to arrival      Past Medical History:  Diagnosis Date  . Anemia    during pregancy  . Bipolar disorder ([place])   . Chiari I malformation ([place])   . Complication of anesthesia    "Oxygen level drops"  . Constipation   . Depression   . Diabetes mellitus without complication ([place])   . Family history of adverse reaction to anesthesia    thinks Mom has problem with oxygen level dropping  . Fibroid   . Headache(784.0)   . Hypertension    takes meds daily  . Kidney stones 2010  . Mental disorder    Bi poloar,  Schizohrenia- not taking meds.  Has been seen at [facility]  . Morbid obesity with BMI of 50.0-59.9, adult ([place])   . Schizophrenia [facility])     Patient Active Problem List   Diagnosis Date Noted  . Chronic migraine 07/30/2017  . Chiari malformation type I ([place]) 07/30/2017  . Diabetes mellitus type 2 in obese ([place]) 10/02/2015  . Low HDL (under 40) 10/02/2015  . OSA (obstructive sleep apnea) 10/02/2015  . 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 SOB (shortness of breath) 04/30/2015  . Dysfunctional uterine bleeding 06/19/2014  . Depo-Provera contraceptive status 06/19/2014  . Morbid obesity with BMI of 50.0-59.9, adult ([place]) 06/08/2013  . Hypertension, benign 06/08/2013  . Menorrhagia  06/08/2013    Past Surgical History:  Procedure Laterality Date  . BREATH TEK H PYLORI N/A 05/31/2015   Procedure: BREATH TEK H PYLORI;  Surgeon: Greer Pickerel, MD;  Location: [facility];  Service: General;  Laterality: N/A;  . CARPAL TUNNEL RELEASE  02/03/2012   Procedure: CARPAL TUNNEL RELEASE;  Surgeon: Meredith Pel, MD;  Location: [place];  Service: Orthopedics;  Laterality: Left;  left carpal tunnel release  . CARPAL TUNNEL RELEASE  01/11/2013   Procedure: CARPAL TUNNEL RELEASE;  Surgeon: Meredith Pel, MD;  Location: [place];  Service: Orthopedics;  Laterality: Right;  Hand Set, Small Retractors, Lead Hand  . CESAREAN SECTION  2006, 2011  . CHOLECYSTECTOMY  06/2011  . cystectomy    . GANGLION CYST EXCISION  02/03/2012   Procedure: REMOVAL GANGLION OF WRIST;  Surgeon: Meredith Pel, MD;  Location: [place];  Service: Orthopedics;  Laterality: Left;  left dorsal ganglion cyst excision  . LAPAROSCOPIC GASTRIC SLEEVE RESECTION N/A 10/02/2015   Procedure: LAPAROSCOPIC GASTRIC SLEEVE RESECTION;  Surgeon: Greer Pickerel, MD;  Location: [facility];  Service: General;  Laterality: N/A;  . Lymp Glands Removed--under arm bil    . Lympectomy    . MULTIPLE TOOTH EXTRACTIONS  2003   and wisdom teeth  . Pilonidial Cyst Removed  1990's  . TUBAL LIGATION  2011  . UPPER GI ENDOSCOPY  10/02/2015   Procedure: UPPER GI ENDOSCOPY;  Surgeon: Greer Pickerel, MD;  Location: [facility];  Service: General;;    OB History    Gravida Para Term Preterm AB Living   3 2 2  0 1 2   SAB TAB Ectopic Multiple Live Births   1 0 0 0 2       Home Medications    Prior to Admission medications   Medication Sig Start Date End Date Taking? Authorizing Provider  Acetaminophen (TYLENOL PO) Take by mouth as needed.    [provider]  miconazole (MONISTAT 7) 2 % vaginal cream Place 1 Applicatorful vaginally at bedtime. Apply for seven nights 08/14/17   Aletha Halim, MD  ondansetron (ZOFRAN ODT) 4 MG disintegrating  tablet Take 1 tablet (4 mg total) by mouth every 8 (eight) hours as needed for nausea or vomiting. 07/30/17   Marcial Pacas, MD  pantoprazole (PROTONIX) 20 MG tablet Take 20 mg by mouth daily. Reported on 03/13/2016 02/06/16   [provider]  rizatriptan (MAXALT-MLT) 10 MG disintegrating tablet Take 1 tablet (10 mg total) by mouth as needed. May repeat in 2 hours if needed 07/30/17   Marcial Pacas, MD  sucralfate (CARAFATE) 1 g tablet TAKE 1 TABLET BY MOUTH EVERY 6 HOURS AS NEEDED. can crush and mix with water or applesauce if needed. 02/25/16   [provider]  zonisamide (ZONEGRAN) 100 MG capsule Take 1 capsule (100 mg total) by mouth daily. 07/30/17   Marcial Pacas, MD    Family History Family History  Problem Relation Age of Onset  . Kidney cancer Father   . Diabetes Mellitus II Father   . Hypertension Father   . Arthritis Mother   . Diabetes Mellitus II Mother   . Hypertension Mother   . Anesthesia problems Neg Hx     Social History Social History  Substance Use Topics  . Smoking status: Former Smoker    Packs/day: 0.25    Types: Cigarettes    Quit date: 10/01/2009  . Smokeless tobacco: Never Used  . Alcohol use Yes     Comment: has a drink twice weekly     Allergies   Penicillins and Oxycodone-acetaminophen   Review of Systems Review of Systems  All other systems reviewed and are negative.    Physical Exam Updated Vital Signs BP (!) 148/91   Pulse (!) 113   Temp 99 F (37.2 C) (Oral)   Resp 15   LMP 08/31/2017   SpO2 100%   Physical Exam  Constitutional: She is oriented to person, place, and time. She appears well-developed and well-nourished.  Non-toxic appearance. No distress.  HENT:  Head: Normocephalic and atraumatic.  Eyes: Pupils are equal, round, and reactive to light. Conjunctivae, EOM and lids are normal.  Neck: Normal range of motion. Neck supple. No tracheal deviation present. No thyroid mass present.  Cardiovascular: Regular rhythm and normal  heart sounds.  Tachycardia present.  Exam reveals no gallop.   No murmur heard. Pulmonary/Chest: Effort normal and breath sounds normal. No stridor. No respiratory distress. She has no decreased breath sounds. She has no wheezes. She has no rhonchi. She has no rales.  Abdominal: Soft. Normal appearance and bowel sounds are normal. She exhibits no distension. There is no tenderness. There is no rebound and no CVA tenderness.  Musculoskeletal: Normal range of motion. She exhibits no edema or tenderness.  Neurological: She is alert and oriented to person, place, and time. She has normal strength. No cranial nerve deficit or sensory deficit. GCS eye subscore is 4. GCS verbal subscore  is 5. GCS motor subscore is 6.  Skin: Skin is warm and dry. No abrasion and no rash noted.  Psychiatric: She has a normal mood and affect. Her speech is normal and behavior is normal.  Nursing note and vitals reviewed.    ED Treatments / Results  Labs (all labs ordered are listed, but only abnormal results are displayed) Labs Reviewed  LIPASE, BLOOD  COMPREHENSIVE METABOLIC PANEL  CBC  URINALYSIS, ROUTINE W REFLEX MICROSCOPIC    EKG  EKG Interpretation None       Radiology No results found.  Procedures Procedures (including critical care time)  Medications Ordered in ED Medications - No data to display   Initial Impression / Assessment and Plan / ED Course  I have reviewed the triage vital signs and the nursing notes.  Pertinent labs & imaging results that were available during my care of the patient were reviewed by me and considered in my medical decision making (see chart for details).     Patient given IV fluids here. Because of concern for C. Difficile she had a stool assay done which was negative. Feels better and will be discharged home. Likely gastroenteritis  Final Clinical Impressions(s) / ED Diagnoses   Final diagnoses:  None    New Prescriptions New Prescriptions   No  medications on file     Lacretia Leigh, MD 09/14/17 1540

## 2017-09-14 NOTE — ED Notes (Signed)
Patient reports she has a PCP but decided to come to the ED instead due to not sure if the office was open.

## 2017-09-14 NOTE — ED Notes (Signed)
Provided patient ginger ale and a pillow.

## 2017-09-14 NOTE — ED Notes (Signed)
Blood draw attempted x2 unsuccessful 

## 2017-09-21 LAB — FUNGUS CULTURE W SMEAR

## 2017-09-29 ENCOUNTER — Encounter: Payer: Self-pay | Admitting: Neurology

## 2017-09-29 ENCOUNTER — Ambulatory Visit (INDEPENDENT_AMBULATORY_CARE_PROVIDER_SITE_OTHER): Payer: Medicare Other | Admitting: Neurology

## 2017-09-29 VITALS — BP 118/78 | HR 76 | Ht 61.0 in | Wt 247.5 lb

## 2017-09-29 DIAGNOSIS — E669 Obesity, unspecified: Secondary | ICD-10-CM

## 2017-09-29 DIAGNOSIS — G43709 Chronic migraine without aura, not intractable, without status migrainosus: Secondary | ICD-10-CM

## 2017-09-29 DIAGNOSIS — E1169 Type 2 diabetes mellitus with other specified complication: Secondary | ICD-10-CM

## 2017-09-29 DIAGNOSIS — IMO0002 Reserved for concepts with insufficient information to code with codable children: Secondary | ICD-10-CM

## 2017-09-29 MED ORDER — ONDANSETRON 8 MG PO TBDP: 8.0000 mg | ORAL_TABLET | Freq: Three times a day (TID) | ORAL | 11 refills | Status: DC | PRN

## 2017-09-29 NOTE — Progress Notes (Signed)
PATIENT: Melinda Ruiz DOB: 02/25/1981  Chief Complaint  Patient presents with  . Migraine    Reports only two migraines since her LP.  She has continued taking Zonegran 100mg  daily. She has not tried Maxalt yet.  She would like to discuss her lab results.     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 initial evaluation was on July 30 2017.  She has past medical history of mood disorder, hypertension, kidney stone, Obesity, status post bariatric surgery in 2016, with 80 pound weight loss.  She had a history of migraine headaches since high school, her typical migraine headaches are left side severe pounding headache with associated light noise sensitivity, nauseous, lasting for a few hours, sleep, Tylenol is helpful  Trigger for her migraines are stress, weather change, hungry, menstruation, bright light, loud noise, she used to have migraine few times each months, but she complains of increased migraine since 2017, become almost daily bases, she has been taking Tylenol daily for few months.  She also complains of blurry vision since 2018, despite changing classes, she has difficulty focusing sometimes, she was seen by optometrist, eventually referred to Dr. Unk Lightning reviewed and summarized the most recent ophthalmology evaluation on June 22 2017, there was description of bilateral irregular margin elevation  We have personally reviewed MRI of the brain on June sixth 2018, Chiari I malformation with the cerebellar tonsils positioned up to 11 mm through the foramen magnum.  No evidence of compression, Normal intracranial MRV.  UPDATE Sep 29 2017: She had lumbar puncture on August 21 2017, open pressure was 31 cm water, protein was 28, glucose was 54, VDRL was nonreactive,  clear colorless spinal fluid, WBC was 0, RBC was 1, Gram stain was negative,  She still has headache 2 bad headache since August 21 2017, her headache are right occipital area, severe, pounding, with light noise sensitivity, mild nauseous, no vomitting,   REVIEW OF SYSTEMS: Full 14 system review of systems performed and notable only for blurred vision, ringing in ears, shortness of breath, agitation  ALLERGIES: Allergies  Allergen Reactions  . Penicillins Hives    .Has patient had a PCN reaction causing immediate rash, facial/tongue/throat swelling, SOB or lightheadedness with hypotension: No Has patient had a PCN reaction causing severe rash involving mucus membranes or skin necrosis: No Has patient had a PCN reaction that required hospitalization No Has patient had a PCN reaction occurring within the last 10 years: No If all of the above answers are "NO", then may proceed with Cephalosporin use.  Marland Kitchen Oxycodone-Acetaminophen Itching    HOME MEDICATIONS: Current Outpatient Prescriptions  Medication Sig Dispense Refill  . miconazole (MONISTAT 7) 2 % vaginal cream Place 1 Applicatorful vaginally at bedtime. Apply for seven nights 30 g 2  . ondansetron (ZOFRAN ODT) 8 MG disintegrating tablet Take 1 tablet (8 mg total) by mouth every 8 (eight) hours as needed for nausea or vomiting. 20 tablet 0  . pantoprazole (PROTONIX) 20 MG tablet Take 20 mg by mouth daily. Reported on 03/13/2016    . rizatriptan (MAXALT-MLT) 10 MG disintegrating tablet Take 1 tablet (10 mg total) by mouth as needed. May repeat in 2 hours if needed 15 tablet 6  . sucralfate (CARAFATE) 1 g tablet TAKE 1 TABLET BY MOUTH EVERY 6 HOURS AS NEEDED. can  crush and mix with water or applesauce if needed.    . zonisamide (ZONEGRAN) 100 MG capsule Take 1 capsule (100 mg total) by mouth daily. 60 capsule 11   No current facility-administered medications for this visit.     PAST MEDICAL HISTORY: Past Medical History:  Diagnosis Date    . Anemia    during pregancy  . Bipolar disorder ([place])   . Chiari I malformation ([place])   . Complication of anesthesia    "Oxygen level drops"  . Constipation   . Depression   . Diabetes mellitus without complication ([place])   . Family history of adverse reaction to anesthesia    thinks Mom has problem with oxygen level dropping  . Fibroid   . Headache(784.0)   . Hypertension    takes meds daily  . Kidney stones 2010  . Mental disorder    Bi poloar,  Schizohrenia- not taking meds.  Has been seen at [facility]  . Morbid obesity with BMI of 50.0-59.9, adult ([place])   . Schizophrenia ([place])     PAST SURGICAL HISTORY: Past Surgical History:  Procedure Laterality Date  . BREATH TEK H PYLORI N/A 05/31/2015   Procedure: BREATH TEK H PYLORI;  Surgeon: Greer Pickerel, MD;  Location: [facility];  Service: General;  Laterality: N/A;  . CARPAL TUNNEL RELEASE  02/03/2012   Procedure: CARPAL TUNNEL RELEASE;  Surgeon: Meredith Pel, MD;  Location: [place];  Service: Orthopedics;  Laterality: Left;  left carpal tunnel release  . CARPAL TUNNEL RELEASE  01/11/2013   Procedure: CARPAL TUNNEL RELEASE;  Surgeon: Meredith Pel, MD;  Location: [place];  Service: Orthopedics;  Laterality: Right;  Hand Set, Small Retractors, Lead Hand  . CESAREAN SECTION  2006, 2011  . CHOLECYSTECTOMY  06/2011  . cystectomy    . GANGLION CYST EXCISION  02/03/2012   Procedure: REMOVAL GANGLION OF WRIST;  Surgeon: Meredith Pel, MD;  Location: [place];  Service: Orthopedics;  Laterality: Left;  left dorsal ganglion cyst excision  . LAPAROSCOPIC GASTRIC SLEEVE RESECTION N/A 10/02/2015   Procedure: LAPAROSCOPIC GASTRIC SLEEVE RESECTION;  Surgeon: Greer Pickerel, MD;  [facility];  Service: General;  Laterality: N/A;  . Lymp Glands Removed--under arm bil    . Lympectomy    . MULTIPLE TOOTH EXTRACTIONS  2003   and wisdom teeth  . Pilonidial Cyst Removed  1990's  . TUBAL LIGATION  2011  . UPPER GI ENDOSCOPY  10/02/2015    Procedure: UPPER GI ENDOSCOPY;  Surgeon: Greer Pickerel, MD;  [facility];  Service: General;;    FAMILY HISTORY: Family History  Problem Relation Age of Onset  . Kidney cancer Father   . Diabetes Mellitus II Father   . Hypertension Father   . Arthritis Mother   . Diabetes Mellitus II Mother   . Hypertension Mother   . Anesthesia problems Neg Hx     SOCIAL HISTORY:  Social History   Social History  . Marital status: Single    Spouse name: N/A  . Number of children: 2  . Years of education: 12   Occupational History  . Dealer    Social History Main Topics  . Smoking status: Former Smoker    Packs/day: 0.25    Types: Cigarettes    Quit date: 10/01/2009  . Smokeless tobacco: Never Used  . Alcohol use Yes     Comment: has a drink twice weekly  . Drug use: No  . Sexual activity:  Yes    Birth control/ protection: Injection   Other Topics Concern  . Not on file   Social History Narrative   Lives at home with children.   Right-handed.   No caffeine use.     PHYSICAL EXAM   Vitals:   09/29/17 1312  BP: 118/78  Pulse: 76  Weight: 247 lb 8 oz (112.3 kg)  Height: 5\' 1"  (1.549 m)    Not recorded      Body mass index is 46.76 kg/m.  PHYSICAL EXAMNIATION:  Gen: NAD, conversant, well nourised, obese, well groomed                     Cardiovascular: Regular rate rhythm, no peripheral edema, warm, nontender. Eyes: Conjunctivae clear without exudates or hemorrhage Neck: Supple, no carotid bruits. Pulmonary: Clear to auscultation bilaterally   NEUROLOGICAL EXAM:  MENTAL STATUS: Speech:    Speech is normal; fluent and spontaneous with normal comprehension.  Cognition:     Orientation to time, place and person     Normal recent and remote memory     Normal Attention span and concentration     Normal Language, naming, repeating,spontaneous speech     Fund of knowledge   CRANIAL NERVES: CN II: Visual fields are full to  confrontation. Fundoscopic exam Mild blurry disc edge, I was not able to appreciate venous pulsations. Pupils are round equal and briskly reactive to light. CN III, IV, VI: extraocular movement are normal. No ptosis. CN V: Facial sensation is intact to pinprick in all 3 divisions bilaterally. Corneal responses are intact.  CN VII: Face is symmetric with normal eye closure and smile. CN VIII: Hearing is normal to rubbing fingers CN IX, X: Palate elevates symmetrically. Phonation is normal. CN XI: Head turning and shoulder shrug are intact CN XII: Tongue is midline with normal movements and no atrophy.  MOTOR: There is no pronator drift of out-stretched arms. Muscle bulk and tone are normal. Muscle strength is normal.  REFLEXES: Reflexes are 2+ and symmetric at the biceps, triceps, knees, and ankles. Plantar responses are flexor.  SENSORY: Intact to light touch, pinprick, positional sensation and vibratory sensation are intact in fingers and toes.  COORDINATION: Rapid alternating movements and fine finger movements are intact. There is no dysmetria on finger-to-nose and heel-knee-shin.    GAIT/STANCE: Posture is normal. Gait is steady with normal steps, base, arm swing, and turning. Heel and toe walking are normal. Tandem gait is normal.  Romberg is absent.   DIAGNOSTIC DATA (LABS, IMAGING, TESTING) - I reviewed patient records, labs, notes, testing and imaging myself where available.   ASSESSMENT AND PLAN  Massie Cogliano Kulzer is a 36 y.o. female   Chronic migraine headache No Chiari malformation type I Possible papillary edema, intracranial hypertension  Lumbar puncture on August 21 2017 showed open pressure of 31 cm H2O,    Zonegran 100 mg twice a day as migraine prevention  Maxalt and Zofran as needed   Marcial Pacas, M.D. Ph.D.  [facility] [address], [place], [place] [address] Ph: [phone] Fax: [phone]  CC: Gevena Cotton, MD,  [place], Ralene Bathe, MD

## 2017-10-02 ENCOUNTER — Emergency Department (HOSPITAL_COMMUNITY): Payer: Medicare Other

## 2017-10-02 ENCOUNTER — Encounter (HOSPITAL_COMMUNITY): Payer: Self-pay | Admitting: Emergency Medicine

## 2017-10-02 ENCOUNTER — Emergency Department (HOSPITAL_COMMUNITY)
Admission: EM | Admit: 2017-10-02 | Discharge: 2017-10-02 | Disposition: A | Discharge status: Home / Self Care | Payer: Medicare Other | Attending: Emergency Medicine | Admitting: Emergency Medicine

## 2017-10-02 DIAGNOSIS — Z79899 Other long term (current) drug therapy: Secondary | ICD-10-CM | POA: Insufficient documentation

## 2017-10-02 DIAGNOSIS — R0602 Shortness of breath: Secondary | ICD-10-CM | POA: Diagnosis present

## 2017-10-02 DIAGNOSIS — E119 Type 2 diabetes mellitus without complications: Secondary | ICD-10-CM | POA: Insufficient documentation

## 2017-10-02 DIAGNOSIS — I10 Essential (primary) hypertension: Secondary | ICD-10-CM | POA: Insufficient documentation

## 2017-10-02 DIAGNOSIS — R002 Palpitations: Secondary | ICD-10-CM | POA: Diagnosis not present

## 2017-10-02 DIAGNOSIS — Z87891 Personal history of nicotine dependence: Secondary | ICD-10-CM | POA: Diagnosis not present

## 2017-10-02 DIAGNOSIS — Z880 Allergy status to penicillin: Secondary | ICD-10-CM | POA: Insufficient documentation

## 2017-10-02 DIAGNOSIS — R1013 Epigastric pain: Secondary | ICD-10-CM | POA: Diagnosis not present

## 2017-10-02 LAB — BASIC METABOLIC PANEL
Anion gap: 12 (ref 5–15)
BUN: 16 mg/dL (ref 6–20)
CO2: 20 mmol/L — ABNORMAL LOW (ref 22–32)
CREATININE: 0.78 mg/dL (ref 0.44–1.00)
Calcium: 9.7 mg/dL (ref 8.9–10.3)
Chloride: 104 mmol/L (ref 101–111)
GFR calc Af Amer: >60 mL/min (ref >60)
Glucose, Bld: 64 mg/dL — ABNORMAL LOW (ref 65–99)
Potassium: 3.6 mmol/L (ref 3.5–5.1)
SODIUM: 136 mmol/L (ref 135–145)

## 2017-10-02 LAB — I-STAT TROPONIN, ED: Troponin i, poc: 0 ng/mL (ref 0.00–0.08)

## 2017-10-02 LAB — D-DIMER, QUANTITATIVE (NOT AT ARMC)

## 2017-10-02 LAB — CBC
HCT: 39.2 % (ref 36.0–46.0)
Hemoglobin: 13 g/dL (ref 12.0–15.0)
MCH: 28.6 pg (ref 26.0–34.0)
MCHC: 33.2 g/dL (ref 30.0–36.0)
MCV: 86.3 fL (ref 78.0–100.0)
Platelets: 396 10*3/uL (ref 150–400)
RBC: 4.54 MIL/uL (ref 3.87–5.11)
RDW: 14.2 % (ref 11.5–15.5)
WBC: 9.3 10*3/uL (ref 4.0–10.5)

## 2017-10-02 LAB — POCT PREGNANCY, URINE: Preg Test, Ur: NEGATIVE

## 2017-10-02 MED ORDER — GI COCKTAIL ~~LOC~~
30.0000 mL | Freq: Once | ORAL | Status: DC
  Filled 2017-10-02: qty 30

## 2017-10-02 MED ORDER — GI COCKTAIL ~~LOC~~
30.0000 mL | Freq: Once | ORAL | Status: AC
  Administered 2017-10-02: 30 mL via ORAL
  Filled 2017-10-02: qty 30

## 2017-10-02 NOTE — ED Triage Notes (Signed)
Pt reports intermittent CP, palpitations, and SOB at rest for the past few months. No n/v/d.

## 2017-10-02 NOTE — ED Provider Notes (Signed)
[place] of shortness of breath onset 3-4 months ago. Waxes and wanes nonexertional. States it was worse today. No nausea or vomiting. No fever. No other associated symptoms that he. On exam alert no distress lungs clear auscultation heart regular rate and rhythm abdomen obese, nontender   Orlie Dakin, MD 10/02/17 2124

## 2017-10-02 NOTE — ED Provider Notes (Signed)
[facility] [facility] DEPT Provider Note   CSN: [id number] Arrival date & time: 10/02/17  1726     History   Chief Complaint Chief Complaint  Patient presents with  . Shortness of Breath  . Palpitations    HPI Melinda Ruiz is a 36 y.o. female presenting with epigastric pain, palpitations, and shortness of breath 1 year.  Patient states that for the past year, she's had intermittent episodes of epigastric pain, palpitations, shortness of breath.  Symptoms appear together, and all subside together. It has become more frequent over the past 3-4 months. Symptoms are present multiple times a week, but not every day. Is not associated with food intake or exercise. She denies associated chest pain, vision changes, dizziness, headaches, nausea, vomiting, or diaphoresis. She has never seen a cardiologist except when she was cleared for surgery in 2016. She denies fevers, chills, urinary symptoms, normal bowel movements. She states she does not always take her vitamins she is supposed s/p gastric sleeve. She has a history of acid reflux, is taking medication for this. She states this does not feel like acid reflux. She denies history of smoking, reports occasional alcohol use, denies drug use. Alcohol use is not associated with symptoms. She denies current symptoms.   HPI  Past Medical History:  Diagnosis Date  . Anemia    during pregancy  . Bipolar disorder ([place])   . Chiari I malformation ([place])   . Complication of anesthesia    "Oxygen level drops"  . Constipation   . Depression   . Diabetes mellitus without complication ([place])   . Family history of adverse reaction to anesthesia    thinks Mom has problem with oxygen level dropping  . Fibroid   . Headache(784.0)   . Hypertension    takes meds daily  . Kidney stones 2010  . Mental disorder    Bi poloar,  Schizohrenia- not taking meds.  Has been seen at [facility]  . Morbid obesity with BMI of 50.0-59.9, adult ([place])   .  Schizophrenia [facility])     Patient Active Problem List   Diagnosis Date Noted  . Chronic migraine 07/30/2017  . Chiari malformation type I ([place]) 07/30/2017  . Diabetes mellitus type 2 in obese ([place]) 10/02/2015  . Low HDL (under 40) 10/02/2015  . OSA (obstructive sleep apnea) 10/02/2015  . 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 SOB (shortness of breath) 04/30/2015  . Dysfunctional uterine bleeding 06/19/2014  . Depo-Provera contraceptive status 06/19/2014  . Morbid obesity with BMI of 50.0-59.9, adult ([place]) 06/08/2013  . Hypertension, benign 06/08/2013  . Menorrhagia 06/08/2013    Past Surgical History:  Procedure Laterality Date  . BREATH TEK H PYLORI N/A 05/31/2015   Procedure: BREATH TEK H PYLORI;  Surgeon: Greer Pickerel, MD;  Location: [facility];  Service: General;  Laterality: N/A;  . CARPAL TUNNEL RELEASE  02/03/2012   Procedure: CARPAL TUNNEL RELEASE;  Surgeon: Meredith Pel, MD;  Location: [place];  Service: Orthopedics;  Laterality: Left;  left carpal tunnel release  . CARPAL TUNNEL RELEASE  01/11/2013   Procedure: CARPAL TUNNEL RELEASE;  Surgeon: Meredith Pel, MD;  Location: [place];  Service: Orthopedics;  Laterality: Right;  Hand Set, Small Retractors, Lead Hand  . CESAREAN SECTION  2006, 2011  . CHOLECYSTECTOMY  06/2011  . cystectomy    . GANGLION CYST EXCISION  02/03/2012   Procedure: REMOVAL GANGLION OF WRIST;  Surgeon: Meredith Pel, MD;  Location: [place];  Service: Orthopedics;  Laterality: Left;  left dorsal ganglion cyst excision  . LAPAROSCOPIC GASTRIC SLEEVE RESECTION N/A 10/02/2015   Procedure: LAPAROSCOPIC GASTRIC SLEEVE RESECTION;  Surgeon: Greer Pickerel, MD;  Location: [facility];  Service: General;  Laterality: N/A;  . Lymp Glands Removed--under arm bil    . Lympectomy    . MULTIPLE TOOTH EXTRACTIONS  2003   and wisdom teeth  . Pilonidial Cyst Removed  1990's  . TUBAL LIGATION  2011  . UPPER GI ENDOSCOPY  10/02/2015   Procedure: UPPER GI  ENDOSCOPY;  Surgeon: Greer Pickerel, MD;  Location: [facility];  Service: General;;    OB History    Gravida Para Term Preterm AB Living   3 2 2  0 1 2   SAB TAB Ectopic Multiple Live Births   1 0 0 0 2       Home Medications    Prior to Admission medications   Medication Sig Start Date End Date Taking? Authorizing Provider  acetaminophen (TYLENOL) 500 MG tablet Take 1,000 mg by mouth every 6 (six) hours as needed for moderate pain.   Yes [provider]  ondansetron (ZOFRAN ODT) 8 MG disintegrating tablet Take 1 tablet (8 mg total) by mouth every 8 (eight) hours as needed for nausea or vomiting. 09/29/17  Yes Marcial Pacas, MD  pantoprazole (PROTONIX) 20 MG tablet Take 20 mg by mouth daily. Reported on 03/13/2016 02/06/16  Yes [provider]  rizatriptan (MAXALT-MLT) 10 MG disintegrating tablet Take 1 tablet (10 mg total) by mouth as needed. May repeat in 2 hours if needed Patient taking differently: Take 10 mg by mouth as needed for migraine. May repeat in 2 hours if needed 07/30/17  Yes Marcial Pacas, MD  sucralfate (CARAFATE) 1 g tablet TAKE 1 TABLET BY MOUTH EVERY 6 HOURS AS NEEDED FOR ULCERS. Can crush and mix with water or applesauce if needed. 02/25/16  Yes [provider]  zonisamide (ZONEGRAN) 100 MG capsule Take 1 capsule (100 mg total) by mouth daily. 07/30/17  Yes Marcial Pacas, MD  miconazole (MONISTAT 7) 2 % vaginal cream Place 1 Applicatorful vaginally at bedtime. Apply for seven nights Patient not taking: Reported on 10/02/2017 08/14/17   Aletha Halim, MD    Family History Family History  Problem Relation Age of Onset  . Kidney cancer Father   . Diabetes Mellitus II Father   . Hypertension Father   . Arthritis Mother   . Diabetes Mellitus II Mother   . Hypertension Mother   . Anesthesia problems Neg Hx     Social History Social History  Substance Use Topics  . Smoking status: Former Smoker    Packs/day: 0.25    Types: Cigarettes    Quit date:  10/01/2009  . Smokeless tobacco: Never Used  . Alcohol use Yes     Comment: has a drink twice weekly     Allergies   Penicillins and Oxycodone-acetaminophen   Review of Systems Review of Systems  Respiratory: Positive for shortness of breath.   Cardiovascular: Positive for palpitations.  Gastrointestinal: Positive for abdominal pain.  All other systems reviewed and are negative.    Physical Exam Updated Vital Signs BP 130/73 (BP Location: Left Arm)   Pulse 89   Temp 98.9 F (37.2 C) (Oral)   Resp 19   Ht 5' (1.524 m)   Wt 108.9 kg (240 lb)   LMP 09/24/2017   SpO2 99%   BMI 46.87 kg/m   Physical Exam  Constitutional: She is oriented to  person, place, and time. She appears well-developed and well-nourished. No distress.  HENT:  Head: Normocephalic and atraumatic.  Eyes: Pupils are equal, round, and reactive to light. EOM are normal.  Neck: Normal range of motion.  Cardiovascular: Normal rate, regular rhythm and intact distal pulses.   Pulmonary/Chest: Effort normal and breath sounds normal. No respiratory distress. She has no decreased breath sounds. She has no wheezes. She has no rhonchi. She has no rales. She exhibits no tenderness.  Patient speaking full sentences without difficulty. Good air movement in all lung fields.  Abdominal: Soft. Normal appearance and bowel sounds are normal. She exhibits no distension. There is tenderness in the epigastric area. There is no rigidity, no rebound, no guarding, no tenderness at McBurney's point and negative Murphy's sign.  Minimal tenderness to palpation of epigastric area. Patient able to sit up without assistance and without pain.  Musculoskeletal: Normal range of motion. She exhibits no tenderness.  No pain or swelling of legs  Neurological: She is alert and oriented to person, place, and time.  Skin: Skin is warm and dry.  Psychiatric: She has a normal mood and affect.  Nursing note and vitals reviewed.   ED Treatments  / Results  Labs (all labs ordered are listed, but only abnormal results are displayed) Labs Reviewed  BASIC METABOLIC PANEL - Abnormal; Notable for the following:       Result Value   CO2 20 (*)    Glucose, Bld 64 (*)    All other components within normal limits  CBC  D-DIMER, QUANTITATIVE (NOT AT [facility])  I-STAT TROPONIN, ED  POC URINE PREG, ED  POCT PREGNANCY, URINE    EKG  EKG Interpretation  Date/Time:  Friday October 02 2017 17:43:48 EDT Ventricular Rate:  97 PR Interval:    QRS Duration: 76 QT Interval:  339 QTC Calculation: 431 R Axis:   41 Text Interpretation:  Sinus rhythm No significant change since last tracing Confirmed by Orlie Dakin [phone]) on 10/02/2017 5:54:38 PM       Radiology Dg Chest 2 View  Result Date: 10/02/2017 CLINICAL DATA:  Intermittent central chest pain, palpitations and shortness of breath at rest for 3-4 months, history hypertension, diabetes mellitus, obesity EXAM: CHEST  2 VIEW COMPARISON:  05/16/2015 FINDINGS: Normal heart size, mediastinal contours, and pulmonary vascularity. Lungs clear. No pleural effusion or pneumothorax. Bones unremarkable. IMPRESSION: Normal exam. Electronically Signed   By: Lavonia Dana M.D.   On: 10/02/2017 19:00    Procedures Procedures (including critical care time)  Medications Ordered in ED Medications  gi cocktail (Maalox,Lidocaine,Donnatal) (30 mLs Oral Given 10/02/17 2246)     Initial Impression / Assessment and Plan / ED Course  I have reviewed the triage vital signs and the nursing notes.  Pertinent labs & imaging results that were available during my care of the patient were reviewed by me and considered in my medical decision making (see chart for details).     Patient presents in 1 year of intermittent symptoms, worsening over the past several months. Physical exam reassuring, as heart rate stable and regular, Sats stable. Abdominal exam shows minimal epigastric tenderness, but otherwise benign.  Pulmonary and cardiac exams benign. Basic lab work shows negative troponin, no leukocytosis, stable hemoglobin, electrolytes and kidney function normal. EKG and chest x-ray negative. At this time, doubt cardiac cause of symptoms. Minimal tenderness to palpation of epigastric, possible GERD. Doubt pulmonary cause. Patient does not appear toxic. Discussed case with attending, and Dr. Winfred Leeds evaluated  the patient. Will order pregnancy and d-dimer.   Pregnancy negative. D-dimer negative. GI cocktail slightly improved epigastric pain. Discussed findings with patient. Discussed that there is no obvious apparent emergent process at this time. Patient appears safe for discharge. Return precautions given. Patient to follow-up with her primary care and GI surgeon. Patient states she understands and agrees to plan.   Final Clinical Impressions(s) / ED Diagnoses   Final diagnoses:  Palpitations  Shortness of breath    New Prescriptions Discharge Medication List as of 10/02/2017 11:24 PM       Franchot Heidelberg, PA-C 10/03/17 0032    Orlie Dakin, MD 10/09/17 [phone]

## 2017-10-02 NOTE — Discharge Instructions (Signed)
Stay well hydrated with water. Follow-up with your primary care doctor for further evaluation of your symptoms and/or follow-up with your GI surgeon for evaluation of your symptoms. Return to the emergency room if you develop persistent chest pain, change in symptoms, fevers, difficulty breathing, or any new or worsening symptoms.

## 2017-10-12 MED FILL — RIZATRIPTAN 10 MG ODT: 10 | 18 days supply | Qty: 9 | Fill #1 | Status: TO

## 2017-10-12 MED FILL — metroNIDAZOLE 500 MG TABS: 500 | 7 days supply | Qty: 14 | Fill #1

## 2017-10-12 MED FILL — ONDANSETRON ODT 4 MG TABLET: 4 | 6 days supply | Qty: 20 | Fill #1 | Status: TO

## 2017-10-12 MED FILL — ZONISAMIDE 100 MG CAPSULE: 100 | 60 days supply | Qty: 60 | Fill #1 | Status: TO

## 2018-02-08 ENCOUNTER — Ambulatory Visit: Payer: Self-pay | Admitting: Family Medicine

## 2018-03-04 ENCOUNTER — Emergency Department (HOSPITAL_COMMUNITY)
Admission: EM | Admit: 2018-03-04 | Discharge: 2018-03-04 | Disposition: A | Discharge status: Home / Self Care | Payer: Medicare Other | Attending: Emergency Medicine | Admitting: Emergency Medicine

## 2018-03-04 ENCOUNTER — Encounter (HOSPITAL_COMMUNITY): Payer: Self-pay

## 2018-03-04 ENCOUNTER — Other Ambulatory Visit: Payer: Self-pay

## 2018-03-04 DIAGNOSIS — F209 Schizophrenia, unspecified: Secondary | ICD-10-CM | POA: Insufficient documentation

## 2018-03-04 DIAGNOSIS — F329 Major depressive disorder, single episode, unspecified: Secondary | ICD-10-CM | POA: Diagnosis not present

## 2018-03-04 DIAGNOSIS — Z87891 Personal history of nicotine dependence: Secondary | ICD-10-CM | POA: Diagnosis not present

## 2018-03-04 DIAGNOSIS — Z9049 Acquired absence of other specified parts of digestive tract: Secondary | ICD-10-CM | POA: Diagnosis not present

## 2018-03-04 DIAGNOSIS — I10 Essential (primary) hypertension: Secondary | ICD-10-CM | POA: Diagnosis not present

## 2018-03-04 DIAGNOSIS — Z9884 Bariatric surgery status: Secondary | ICD-10-CM | POA: Insufficient documentation

## 2018-03-04 DIAGNOSIS — Z79899 Other long term (current) drug therapy: Secondary | ICD-10-CM | POA: Insufficient documentation

## 2018-03-04 DIAGNOSIS — J029 Acute pharyngitis, unspecified: Secondary | ICD-10-CM | POA: Diagnosis present

## 2018-03-04 DIAGNOSIS — F319 Bipolar disorder, unspecified: Secondary | ICD-10-CM | POA: Insufficient documentation

## 2018-03-04 DIAGNOSIS — E119 Type 2 diabetes mellitus without complications: Secondary | ICD-10-CM | POA: Diagnosis not present

## 2018-03-04 LAB — RAPID STREP SCREEN (MED CTR MEBANE ONLY): STREPTOCOCCUS, GROUP A SCREEN (DIRECT): NEGATIVE

## 2018-03-04 MED ORDER — PROMETHAZINE-DM 6.25-15 MG/5ML PO SYRP: 5.0000 mL | ORAL_SOLUTION | Freq: Every evening | ORAL | 0 refills | Status: DC | PRN

## 2018-03-04 NOTE — ED Notes (Signed)
Strep swab performed again. Sent to lab.

## 2018-03-04 NOTE — ED Notes (Signed)
See EDP secondary assessment.  

## 2018-03-04 NOTE — ED Provider Notes (Signed)
[place] EMERGENCY DEPARTMENT Provider Note   CSN: [id number] Arrival date & time: 03/04/18  7035     History   Chief Complaint Chief Complaint  Patient presents with  . URI    HPI Melinda Ruiz is a 37 y.o. female we denies any past medical history or medications presenting with 1 day of sore throat, cough, body aches.  Reports that last night she started having a mild sore throat and woke up this morning with cough and myalgias.  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  She reports that she has received her flu shot this year.  Denies productive cough, chest pain, difficulty swallowing or breathing.  She has tried DayQuil earlier this morning modest relief.    HPI  Past Medical History:  Diagnosis Date  . Anemia    during pregancy  . Bipolar disorder ([place])   . Chiari I malformation ([place])   . Complication of anesthesia    "Oxygen level drops"  . Constipation   . Depression   . Diabetes mellitus without complication ([place])   . Family history of adverse reaction to anesthesia    thinks Mom has problem with oxygen level dropping  . Fibroid   . Headache(784.0)   . Hypertension    takes meds daily  . Kidney stones 2010  . Mental disorder    Bi poloar,  Schizohrenia- not taking meds.  Has been [facility] at [facility]  . Morbid obesity with BMI of 50.0-59.9, adult ([place])   . Schizophrenia [facility])     Patient Active Problem List   Diagnosis Date Noted  . Chronic migraine 07/30/2017  . Chiari malformation type I ([place]) 07/30/2017  . Diabetes mellitus type 2 in obese ([place]) 10/02/2015  . Low HDL (under 40) 10/02/2015  . OSA (obstructive sleep apnea) 10/02/2015  . 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 SOB (shortness of breath) 04/30/2015  . Dysfunctional uterine bleeding 06/19/2014  . Depo-Provera contraceptive status 06/19/2014  . Morbid obesity with BMI of 50.0-59.9, adult ([place]) 06/08/2013  . Hypertension, benign  06/08/2013  . Menorrhagia 06/08/2013    Past Surgical History:  Procedure Laterality Date  . BREATH TEK H PYLORI N/A 05/31/2015   Procedure: BREATH TEK H PYLORI;  Surgeon: Greer Pickerel, MD;  Location: [facility];  Service: General;  Laterality: N/A;  . CARPAL TUNNEL RELEASE  02/03/2012   Procedure: CARPAL TUNNEL RELEASE;  Surgeon: Meredith Pel, MD;  Location: [place];  Service: Orthopedics;  Laterality: Left;  left carpal tunnel release  . CARPAL TUNNEL RELEASE  01/11/2013   Procedure: CARPAL TUNNEL RELEASE;  Surgeon: Meredith Pel, MD;  Location: [place];  Service: Orthopedics;  Laterality: Right;  Hand Set, Small Retractors, Lead Hand  . CESAREAN SECTION  2006, 2011  . CHOLECYSTECTOMY  06/2011  . cystectomy    . GANGLION CYST EXCISION  02/03/2012   Procedure: REMOVAL GANGLION OF WRIST;  Surgeon: Meredith Pel, MD;  Location: [place];  Service: Orthopedics;  Laterality: Left;  left dorsal ganglion cyst excision  . LAPAROSCOPIC GASTRIC SLEEVE RESECTION N/A 10/02/2015   Procedure: LAPAROSCOPIC GASTRIC SLEEVE RESECTION;  Surgeon: Greer Pickerel, MD;  [facility];  Service: General;  Laterality: N/A;  . Lymp Glands Removed--under arm bil    . Lympectomy    . MULTIPLE TOOTH EXTRACTIONS  2003   and wisdom teeth  . Pilonidial Cyst Removed  1990's  . TUBAL LIGATION  2011  .  UPPER GI ENDOSCOPY  10/02/2015   Procedure: UPPER GI ENDOSCOPY;  Surgeon: Greer Pickerel, MD;  [facility];  Service: General;;    OB History    Gravida Para Term Preterm AB Living   3 2 2  0 1 2   SAB TAB Ectopic Multiple Live Births   1 0 0 0 2       Home Medications    Prior to Admission medications   Medication Sig Start Date End Date Taking? Authorizing Provider  acetaminophen (TYLENOL) 500 MG tablet Take 1,000 mg by mouth every 6 (six) hours as needed for moderate pain.    [provider]  miconazole (MONISTAT 7) 2 % vaginal cream Place 1 Applicatorful vaginally at bedtime. Apply for seven  nights Patient not taking: Reported on 10/02/2017 08/14/17   Aletha Halim, MD  ondansetron (ZOFRAN ODT) 8 MG disintegrating tablet Take 1 tablet (8 mg total) by mouth every 8 (eight) hours as needed for nausea or vomiting. 09/29/17   Marcial Pacas, MD  pantoprazole (PROTONIX) 20 MG tablet Take 20 mg by mouth daily. Reported on 03/13/2016 02/06/16   [provider]  promethazine-dextromethorphan (PROMETHAZINE-DM) 6.25-15 MG/5ML syrup Take 5 mLs by mouth at bedtime as needed for cough. 03/04/18   Avie Echevaria B, PA-C  rizatriptan (MAXALT-MLT) 10 MG disintegrating tablet Take 1 tablet (10 mg total) by mouth as needed. May repeat in 2 hours if needed Patient taking differently: Take 10 mg by mouth as needed for migraine. May repeat in 2 hours if needed 07/30/17   Marcial Pacas, MD  sucralfate (CARAFATE) 1 g tablet TAKE 1 TABLET BY MOUTH EVERY 6 HOURS AS NEEDED FOR ULCERS. Can crush and mix with water or applesauce if needed. 02/25/16   [provider]  zonisamide (ZONEGRAN) 100 MG capsule Take 1 capsule (100 mg total) by mouth daily. 07/30/17   Marcial Pacas, MD    Family History Family History  Problem Relation Age of Onset  . Kidney cancer Father   . Diabetes Mellitus II Father   . Hypertension Father   . Arthritis Mother   . Diabetes Mellitus II Mother   . Hypertension Mother   . Anesthesia problems Neg Hx     Social History Social History   Tobacco Use  . Smoking status: Former Smoker    Packs/day: 0.25    Types: Cigarettes    Last attempt to quit: 10/01/2009    Years since quitting: 8.4  . Smokeless tobacco: Never Used  Substance Use Topics  . Alcohol use: Yes    Comment: has a drink twice weekly  . Drug use: No     Allergies   Penicillins and Oxycodone-acetaminophen   Review of Systems Review of Systems  Constitutional: Positive for chills. Negative for activity change, appetite change, diaphoresis, fatigue and fever.  HENT: Positive for congestion, sore throat  and voice change. Negative for ear discharge, ear pain, tinnitus and trouble swallowing.   Eyes: Negative for pain, redness and visual disturbance.  Respiratory: Positive for cough. Negative for choking, chest tightness, wheezing and stridor.   Cardiovascular: Negative for chest pain, palpitations and leg swelling.  Gastrointestinal: Negative for abdominal distention, abdominal pain, diarrhea, nausea and vomiting.  Genitourinary: Negative for dysuria, flank pain and hematuria.  Musculoskeletal: Positive for myalgias. Negative for arthralgias, back pain, gait problem, neck pain and neck stiffness.  Skin: Negative for color change, pallor, rash and wound.  Neurological: Negative for dizziness, weakness, light-headedness and headaches.  Psychiatric/Behavioral: Negative for behavioral  problems.     Physical Exam Updated Vital Signs BP 120/77 (BP Location: Right Arm)   Pulse 85   Temp 98.9 F (37.2 C) (Oral)   Resp 18   Ht 5' (1.524 m)   Wt 113.4 kg (250 lb)   SpO2 100%   BMI 48.82 kg/m   Physical Exam  Constitutional: She appears well-developed and well-nourished. No distress.  Afebrile, nontoxic-appearing, sitting comfortably in bed no acute distress.  HENT:  Head: Normocephalic and atraumatic.  Right Ear: External ear normal.  Left Ear: External ear normal.  Mouth/Throat: Oropharynx is clear and moist. No oropharyngeal exudate.  Normal tympanic membranes bilaterally  Eyes: Conjunctivae and EOM are normal. Right eye exhibits no discharge. Left eye exhibits no discharge.  Neck: Normal range of motion. Neck supple.  Cardiovascular: Normal rate, regular rhythm and normal heart sounds.  No murmur heard. Pulmonary/Chest: Effort normal and breath sounds normal. No stridor. No respiratory distress. She has no wheezes. She has no rales.  Abdominal: She exhibits no distension.  Musculoskeletal: Normal range of motion. She exhibits no edema.  Lymphadenopathy:    She has cervical  adenopathy.  Neurological: She is alert.  Skin: Skin is warm and dry. No rash noted. She is not diaphoretic. No erythema. No pallor.  Psychiatric: She has a normal mood and affect.  Nursing note and vitals reviewed.    ED Treatments / Results  Labs (all labs ordered are listed, but only abnormal results are displayed) Labs Reviewed  RAPID STREP SCREEN (NOT AT [facility])  CULTURE, GROUP A STREP [facility])    EKG  EKG Interpretation None       Radiology No results found.  Procedures Procedures (including critical care time)  Medications Ordered in ED Medications - No data to display   Initial Impression / Assessment and Plan / ED Course  I have reviewed the triage vital signs and the nursing notes.  Pertinent labs & imaging results that were available during my care of the patient were reviewed by me and considered in my medical decision making (see chart for details).    Patient presents with 1 day of sore throat, cough, myalgias. Up-to-date on flu immunization.  Oropharynx is clear without exudate or evidence of peritonsillar abscess.  Is tolerating oral secretions well, no difficulty breathing.  Rapid strep negative Pt presents afebrile without tonsillar exudate, negative strep. mild cervical lymphadenopathy, & odynophagia; diagnosis of viral pharyngitis. No abx indicated. DC w symptomatic tx for pain.  Pt does not appear dehydrated, but did discuss importance of water rehydration. Presentation non concerning for PTA or infxn spread to soft tissue. No trismus or uvula deviation. Specific return precautions discussed. Pt able to drink water in ED without difficulty with intact airway.   Discharge home with symptomatic relief and close follow-up with PCP.  Discussed strict return precautions and advised to return to the emergency department if experiencing any new or worsening symptoms. Instructions were understood and patient agreed with discharge plan. Final Clinical  Impressions(s) / ED Diagnoses   Final diagnoses:  Pharyngitis, unspecified etiology    ED Discharge Orders        Ordered    promethazine-dextromethorphan (PROMETHAZINE-DM) 6.25-15 MG/5ML syrup  At bedtime PRN     03/04/18 1257       Emeline General, PA-C 03/04/18 1259    Lajean Saver, MD 03/04/18 1353

## 2018-03-04 NOTE — Discharge Instructions (Signed)
As discussed, you may want to try tea with honey, cough drops, throat sprays over-the-counter to help soothe your throat.  Make sure that you stay well-hydrated. Follow up with your primary care provider. Return if symptoms worsen, difficulty swallowing, drooling, fever, difficulty breathing or other new concerning symptoms in the meantime.

## 2018-03-04 NOTE — ED Triage Notes (Signed)
Pt states she initially had a cough, sore throat and has continued to feel worse. No has body aches. Afebrile in triage. Voice noted to be hoarse.

## 2018-03-06 LAB — CULTURE, GROUP A STREP (THRC)

## 2018-03-12 ENCOUNTER — Ambulatory Visit: Payer: Medicare Other | Admitting: Family Medicine

## 2018-03-12 ENCOUNTER — Other Ambulatory Visit (HOSPITAL_COMMUNITY)
Admission: RE | Admit: 2018-03-12 | Discharge: 2018-03-12 | Disposition: A | Discharge status: Home / Self Care | Payer: Medicare Other | Source: Ambulatory Visit | Attending: Family Medicine | Admitting: Family Medicine

## 2018-03-12 ENCOUNTER — Ambulatory Visit: Payer: Medicare Other | Admitting: General Practice

## 2018-03-12 DIAGNOSIS — Z113 Encounter for screening for infections with a predominantly sexual mode of transmission: Secondary | ICD-10-CM

## 2018-03-12 NOTE — Progress Notes (Signed)
Patient left urine and requests gc/ch testing. Patient did not see a nurse.

## 2018-03-12 NOTE — Progress Notes (Signed)
Chart reviewed - agree with RN documentation.   

## 2018-03-15 ENCOUNTER — Ambulatory Visit: Payer: Self-pay

## 2018-03-15 LAB — GC/CHLAMYDIA PROBE AMP (~~LOC~~) NOT AT ARMC
Chlamydia: NEGATIVE
NEISSERIA GONORRHEA: NEGATIVE

## 2018-03-30 NOTE — Progress Notes (Deleted)
[facility]  PATIENT: Melinda Ruiz DOB: August 19, 1981   REASON FOR VISIT: *** HISTORY FROM:    HISTORY OF PRESENT ILLNESS:Melinda Ruiz is a 37 years old right-handed female, seen in refer by her ophthalmologist Gevena Cotton, for evaluation of Arnold-Chiari malformation, history of chronic headache her primary care physician is Dr., Alphonzo Grieve, [place]. initial evaluation was on July 30 2017.  She has past medical history of mood disorder, hypertension, kidney stone, Obesity, status post bariatric surgery in 2016, with 80 pound weight loss.  She had a history of migraine headaches since high school, her typical migraine headaches are left side severe pounding headache with associated light noise sensitivity, nauseous, lasting for a few hours, sleep, Tylenol is helpful  Trigger for her migraines are stress, weather change, hungry, menstruation, bright light, loud noise, she used to have migraine few times each months, but she complains of increased migraine since 2017, become almost daily bases, she has been taking Tylenol daily for few months.  She also complains of blurry vision since 2018, despite changing classes, she has difficulty focusing sometimes, she was seen by optometrist, eventually referred to Dr. Unk Lightning reviewed and summarized the most recent ophthalmology evaluation on June 22 2017, there was description of bilateral irregular margin elevation  We have personally reviewed MRI of the brain on June sixth 2018, Chiari I malformation with the cerebellar tonsils positioned up to 11 mm through the foramen magnum.  No evidence of compression, Normal intracranial MRV.  UPDATE Sep 29 2017:YY She had lumbar puncture on August 21 2017, open pressure was 31 cm water, protein was 28, glucose was 54, VDRL was nonreactive, clear colorless spinal fluid, WBC was 0, RBC was 1, Gram stain was negative,  She still has headache 2 bad headache since August 21 2017, her headache are right occipital area, severe, pounding, with light noise sensitivity, mild nauseous, no vomitting,      REVIEW OF SYSTEMS: Full 14 system review of systems performed and notable only for those listed, all others are neg:  Constitutional: neg  Cardiovascular: neg Ear/Nose/Throat: neg  Skin: neg Eyes: neg Respiratory: neg Gastroitestinal: neg  Hematology/Lymphatic: neg  Endocrine: neg Musculoskeletal:neg Allergy/Immunology: neg Neurological: neg Psychiatric: neg Sleep : neg   ALLERGIES: Allergies  Allergen Reactions  . Penicillins Hives    .Has patient had a PCN reaction causing immediate rash, facial/tongue/throat swelling, SOB or lightheadedness with hypotension: No Has patient had a PCN reaction causing severe rash involving mucus membranes or skin necrosis: No Has patient had a PCN reaction that required hospitalization No Has patient had a PCN reaction occurring within the last 10 years: No If all of the above answers are "NO", then may proceed with Cephalosporin use.  Marland Kitchen Oxycodone-Acetaminophen Itching    HOME MEDICATIONS: Outpatient Medications Prior to Visit  Medication Sig Dispense Refill  . acetaminophen (TYLENOL) 500 MG tablet Take 1,000 mg by mouth every 6 (six) hours as needed for moderate pain.    . miconazole (MONISTAT 7) 2 % vaginal cream Place 1 Applicatorful vaginally at bedtime. Apply for seven nights (Patient not taking: Reported on 10/02/2017) 30 g 2  . ondansetron (ZOFRAN ODT) 8 MG disintegrating tablet Take 1 tablet (8 mg total) by mouth every 8 (eight) hours as needed for nausea or vomiting. 20 tablet 11  . pantoprazole (PROTONIX) 20 MG tablet Take 20 mg by mouth daily. Reported on 03/13/2016    . promethazine-dextromethorphan (PROMETHAZINE-DM) 6.25-15 MG/5ML syrup Take 5 mLs by mouth at  bedtime as needed for cough. 118 mL 0  . rizatriptan (MAXALT-MLT) 10 MG disintegrating tablet Take 1 tablet (10 mg total) by mouth as needed.  May repeat in 2 hours if needed (Patient taking differently: Take 10 mg by mouth as needed for migraine. May repeat in 2 hours if needed) 15 tablet 6  . sucralfate (CARAFATE) 1 g tablet TAKE 1 TABLET BY MOUTH EVERY 6 HOURS AS NEEDED FOR ULCERS. Can crush and mix with water or applesauce if needed.    . zonisamide (ZONEGRAN) 100 MG capsule Take 1 capsule (100 mg total) by mouth daily. 60 capsule 11   No facility-administered medications prior to visit.     PAST MEDICAL HISTORY: Past Medical History:  Diagnosis Date  . Anemia    during pregancy  . Bipolar disorder ([place])   . Chiari I malformation ([place])   . Complication of anesthesia    "Oxygen level drops"  . Constipation   . Depression   . Diabetes mellitus without complication ([place])   . Family history of adverse reaction to anesthesia    thinks Mom has problem with oxygen level dropping  . Fibroid   . Headache(784.0)   . Hypertension    takes meds daily  . Kidney stones 2010  . Mental disorder    Bi poloar,  Schizohrenia- not taking meds.  Has [facility]  . Morbid obesity with BMI of 50.0-59.9, adult ([place])   . Schizophrenia ([place])     PAST SURGICAL HISTORY: Past Surgical History:  Procedure Laterality Date  . BREATH TEK H PYLORI N/A 05/31/2015   Procedure: BREATH TEK H PYLORI;  Surgeon: Greer Pickerel, MD;  Location: [facility];  Service: General;  Laterality: N/A;  . CARPAL TUNNEL RELEASE  02/03/2012   Procedure: CARPAL TUNNEL RELEASE;  Surgeon: Meredith Pel, MD;  Location: [place];  Service: Orthopedics;  Laterality: Left;  left carpal tunnel release  . CARPAL TUNNEL RELEASE  01/11/2013   Procedure: CARPAL TUNNEL RELEASE;  Surgeon: Meredith Pel, MD;  Location: [place];  Service: Orthopedics;  Laterality: Right;  Hand Set, Small Retractors, Lead Hand  . CESAREAN SECTION  2006, 2011  . CHOLECYSTECTOMY  06/2011  . cystectomy    . GANGLION CYST EXCISION  02/03/2012   Procedure: REMOVAL GANGLION OF WRIST;   Surgeon: Meredith Pel, MD;  Location: [place];  Service: Orthopedics;  Laterality: Left;  left dorsal ganglion cyst excision  . LAPAROSCOPIC GASTRIC SLEEVE RESECTION N/A 10/02/2015   Procedure: LAPAROSCOPIC GASTRIC SLEEVE RESECTION;  Surgeon: Greer Pickerel, MD;  Location: [facility];  Service: General;  Laterality: N/A;  . Lymp Glands Removed--under arm bil    . Lympectomy    . MULTIPLE TOOTH EXTRACTIONS  2003   and wisdom teeth  . Pilonidial Cyst Removed  1990's  . TUBAL LIGATION  2011  . UPPER GI ENDOSCOPY  10/02/2015   Procedure: UPPER GI ENDOSCOPY;  Surgeon: Greer Pickerel, MD;  Location: [facility];  Service: General;;    FAMILY HISTORY: Family History  Problem Relation Age of Onset  . Kidney cancer Father   . Diabetes Mellitus II Father   . Hypertension Father   . Arthritis Mother   . Diabetes Mellitus II Mother   . Hypertension Mother   . Anesthesia problems Neg Hx     SOCIAL HISTORY: Social History   Socioeconomic History  . Marital status: Single    Spouse name: Not on file  . Number of children: 2  .  Years of education: 33  . Highest education level: Not on file  Occupational History  . Occupation: Dealer  Social Needs  . Financial resource strain: Not on file  . Food insecurity:    Worry: Not on file    Inability: Not on file  . Transportation needs:    Medical: Not on file    Non-medical: Not on file  Tobacco Use  . Smoking status: Former Smoker    Packs/day: 0.25    Types: Cigarettes    Last attempt to quit: 10/01/2009    Years since quitting: 8.4  . Smokeless tobacco: Never Used  Substance and Sexual Activity  . Alcohol use: Yes    Comment: has a drink twice weekly  . Drug use: No  . Sexual activity: Yes    Birth control/protection: Injection  Lifestyle  . Physical activity:    Days per week: Not on file    Minutes per session: Not on file  . Stress: Not on file  Relationships  . Social connections:    Talks on phone: Not on file      Gets together: Not on file    Attends religious service: Not on file    Active member of club or organization: Not on file    Attends meetings of clubs or organizations: Not on file    Relationship status: Not on file  . Intimate partner violence:    Fear of current or ex partner: Not on file    Emotionally abused: Not on file    Physically abused: Not on file    Forced sexual activity: Not on file  Other Topics Concern  . Not on file  Social History Narrative   Lives at home with children.   Right-handed.   No caffeine use.     PHYSICAL EXAM  There were no vitals filed for this visit. There is no height or weight on file to calculate BMI.  Generalized: Well developed, in no acute distress  Head: normocephalic and atraumatic,. Oropharynx benign  Neck: Supple, no carotid bruits  Cardiac: Regular rate rhythm, no murmur  Musculoskeletal: No deformity   Neurological examination   Mentation: Alert oriented to time, place, history taking. Attention span and concentration appropriate. Recent and remote memory intact.  Follows all commands speech and language fluent.   Cranial nerve II-XII: Fundoscopic exam reveals sharp disc margins.Pupils were equal round reactive to light extraocular movements were full, visual field were full on confrontational test. Facial sensation and strength were normal. hearing was intact to finger rubbing bilaterally. Uvula tongue midline. head turning and shoulder shrug were normal and symmetric.Tongue protrusion into cheek strength was normal. Motor: normal bulk and tone, full strength in the BUE, BLE, fine finger movements normal, no pronator drift. No focal weakness Sensory: normal and symmetric to light touch, pinprick, and  Vibration, proprioception  Coordination: finger-nose-finger, heel-to-shin bilaterally, no dysmetria Reflexes: Brachioradialis 2/2, biceps 2/2, triceps 2/2, patellar 2/2, Achilles 2/2, plantar responses were flexor  bilaterally. Gait and Station: Rising up from seated position without assistance, normal stance,  moderate stride, good arm swing, smooth turning, able to perform tiptoe, and heel walking without difficulty. Tandem gait is steady  DIAGNOSTIC DATA (LABS, IMAGING, TESTING) - I reviewed patient records, labs, notes, testing and imaging myself where available.  Lab Results  Component Value Date   WBC 9.3 10/02/2017   HGB 13.0 10/02/2017   HCT 39.2 10/02/2017   MCV 86.3 10/02/2017   PLT 396 10/02/2017  Component Value Date/Time   NA 136 10/02/2017 1753   K 3.6 10/02/2017 1753   CL 104 10/02/2017 1753   CO2 20 (L) 10/02/2017 1753   GLUCOSE 64 (L) 10/02/2017 1753   BUN 16 10/02/2017 1753   CREATININE 0.78 10/02/2017 1753   CALCIUM 9.7 10/02/2017 1753   PROT 8.1 09/14/2017 1330   ALBUMIN 4.1 09/14/2017 1330   AST 23 09/14/2017 1330   ALT 13 (L) 09/14/2017 1330   ALKPHOS 88 09/14/2017 1330   BILITOT 0.9 09/14/2017 1330   GFRNONAA >60 10/02/2017 1753   GFRAA >60 10/02/2017 1753     ASSESSMENT AND PLAN  37 y.o. year old female  has a past medical history of Anemia, Bipolar disorder ([place]), Chiari I malformation ([place]), Complication of anesthesia, Constipation, Depression, Diabetes mellitus without complication ([place]), Family history of adverse reaction to anesthesia, Fibroid, Headache(784.0), Hypertension, Kidney stones (2010), Mental disorder, Morbid obesity with BMI of 50.0-59.9, adult ([place]), and Schizophrenia ([place]). here with ***  KYNNADI DICENSO is a 37 y.o. female   Chronic migraine headache No Chiari malformation type I Possible papillary edema, intracranial hypertension             Lumbar puncture on August 21 2017 showed open pressure of 31 cm H2O,               Zonegran 100 mg twice a day as migraine prevention             Maxalt and Zofran as needed    Dennie Bible, [facility], [facility], [facility] [address] [place], [place]  [address] [phone]

## 2018-03-31 ENCOUNTER — Ambulatory Visit: Payer: Medicare Other | Admitting: Nurse Practitioner

## 2018-04-01 ENCOUNTER — Encounter: Payer: Self-pay | Admitting: Nurse Practitioner

## 2018-04-22 ENCOUNTER — Encounter: Payer: Self-pay | Admitting: Family Medicine

## 2018-04-22 ENCOUNTER — Ambulatory Visit (INDEPENDENT_AMBULATORY_CARE_PROVIDER_SITE_OTHER): Payer: Medicare Other | Admitting: Family Medicine

## 2018-04-22 ENCOUNTER — Other Ambulatory Visit (HOSPITAL_COMMUNITY)
Admission: RE | Admit: 2018-04-22 | Discharge: 2018-04-22 | Disposition: A | Discharge status: Home / Self Care | Payer: Medicare Other | Source: Ambulatory Visit | Attending: Obstetrics & Gynecology | Admitting: Obstetrics & Gynecology

## 2018-04-22 VITALS — BP 121/80 | HR 86 | Ht 60.0 in | Wt 255.7 lb

## 2018-04-22 DIAGNOSIS — E669 Obesity, unspecified: Secondary | ICD-10-CM

## 2018-04-22 DIAGNOSIS — Z113 Encounter for screening for infections with a predominantly sexual mode of transmission: Secondary | ICD-10-CM

## 2018-04-22 DIAGNOSIS — Z01411 Encounter for gynecological examination (general) (routine) with abnormal findings: Secondary | ICD-10-CM | POA: Diagnosis not present

## 2018-04-22 DIAGNOSIS — Z01419 Encounter for gynecological examination (general) (routine) without abnormal findings: Secondary | ICD-10-CM | POA: Diagnosis present

## 2018-04-22 DIAGNOSIS — E1169 Type 2 diabetes mellitus with other specified complication: Secondary | ICD-10-CM

## 2018-04-22 DIAGNOSIS — N920 Excessive and frequent menstruation with regular cycle: Secondary | ICD-10-CM

## 2018-04-22 DIAGNOSIS — E119 Type 2 diabetes mellitus without complications: Secondary | ICD-10-CM

## 2018-04-22 MED ORDER — NORETHINDRONE 0.35 MG PO TABS: 1.0000 | ORAL_TABLET | Freq: Every day | ORAL | 11 refills | Status: DC

## 2018-04-22 NOTE — Progress Notes (Signed)
GYNECOLOGY ANNUAL PREVENTATIVE CARE ENCOUNTER NOTE  Subjective:   Melinda Ruiz is a 37 y.o. G73P2012 female here for a routine annual gynecologic exam.  Current complaints: menorrhagia. Has had IUD, which worked well, but had frequent infections. Also tried [place], but had irregular bleeding.   Denies abnormal vaginal bleeding, discharge, pelvic pain, problems with intercourse or other gynecologic concerns.    Gynecologic History Patient's last menstrual period was 04/03/2018. Patient is sexually active  Contraception: tubal ligation Last Pap: 03/2017. Results were: normal Last mammogram: n/a.  Obstetric History OB History  Gravida Para Term Preterm AB Living  3 2 2  0 1 2  SAB TAB Ectopic Multiple Live Births  1 0 0 0 2    # Outcome Date GA Lbr Len/2nd Weight Sex Delivery Anes PTL Lv  3 Term 12/13/10    F CS-Unspec Spinal  LIV     Birth Comments: repeat c/s  2 SAB 2008          1 Term 09/06/05    M CS-Unspec EPI  LIV     Birth Comments: c/s for nrfhr    Past Medical History:  Diagnosis Date  . Anemia    during pregancy  . Bipolar disorder ([place])   . Chiari I malformation ([place])   . Complication of anesthesia    "Oxygen level drops"  . Constipation   . Depression   . Diabetes mellitus without complication ([place])   . Family history of adverse reaction to anesthesia    thinks Mom has problem with oxygen level dropping  . Fibroid   . Headache(784.0)   . Hypertension    takes meds daily  . Kidney stones 2010  . Mental disorder    Bi poloar,  Schizohrenia- not taking meds.  [facility]  . Morbid obesity with BMI of 50.0-59.9, adult ([place])   . Schizophrenia [facility])     Past Surgical History:  Procedure Laterality Date  . BREATH TEK H PYLORI N/A 05/31/2015   Procedure: BREATH TEK H PYLORI;  Surgeon: Greer Pickerel, MD;  Location: [facility];  Service: General;  Laterality: N/A;  . CARPAL TUNNEL RELEASE  02/03/2012   Procedure: CARPAL TUNNEL  RELEASE;  Surgeon: Meredith Pel, MD;  Location: [place];  Service: Orthopedics;  Laterality: Left;  left carpal tunnel release  . CARPAL TUNNEL RELEASE  01/11/2013   Procedure: CARPAL TUNNEL RELEASE;  Surgeon: Meredith Pel, MD;  Location: [place];  Service: Orthopedics;  Laterality: Right;  Hand Set, Small Retractors, Lead Hand  . CESAREAN SECTION  2006, 2011  . CHOLECYSTECTOMY  06/2011  . cystectomy    . GANGLION CYST EXCISION  02/03/2012   Procedure: REMOVAL GANGLION OF WRIST;  Surgeon: Meredith Pel, MD;  Location: [place];  Service: Orthopedics;  Laterality: Left;  left dorsal ganglion cyst excision  . LAPAROSCOPIC GASTRIC SLEEVE RESECTION N/A 10/02/2015   Procedure: LAPAROSCOPIC GASTRIC SLEEVE RESECTION;  Surgeon: Greer Pickerel, MD;  [facility];  Service: General;  Laterality: N/A;  . Lymp Glands Removed--under arm bil    . Lympectomy    . MULTIPLE TOOTH EXTRACTIONS  2003   and wisdom teeth  . Pilonidial Cyst Removed  1990's  . TUBAL LIGATION  2011  . UPPER GI ENDOSCOPY  10/02/2015   Procedure: UPPER GI ENDOSCOPY;  Surgeon: Greer Pickerel, MD;  [facility];  Service: General;;    Current Outpatient Medications on File Prior to Visit  Medication  Sig Dispense Refill  . acetaminophen (TYLENOL) 500 MG tablet Take 1,000 mg by mouth every 6 (six) hours as needed for moderate pain.    . pantoprazole (PROTONIX) 20 MG tablet Take 20 mg by mouth daily. Reported on 03/13/2016    . rizatriptan (MAXALT-MLT) 10 MG disintegrating tablet Take 1 tablet (10 mg total) by mouth as needed. May repeat in 2 hours if needed (Patient taking differently: Take 10 mg by mouth as needed for migraine. May repeat in 2 hours if needed) 15 tablet 6  . sucralfate (CARAFATE) 1 g tablet TAKE 1 TABLET BY MOUTH EVERY 6 HOURS AS NEEDED FOR ULCERS. Can crush and mix with water or applesauce if needed.     No current facility-administered medications on file prior to visit.     Allergies  Allergen Reactions  .  Penicillins Hives    .Has patient had a PCN reaction causing immediate rash, facial/tongue/throat swelling, SOB or lightheadedness with hypotension: No Has patient had a PCN reaction causing severe rash involving mucus membranes or skin necrosis: No Has patient had a PCN reaction that required hospitalization No Has patient had a PCN reaction occurring within the last 10 years: No If all of the above answers are "NO", then may proceed with Cephalosporin use.  Marland Kitchen Oxycodone-Acetaminophen Itching    Social History   Socioeconomic History  . Marital status: Single    Spouse name: Not on file  . Number of children: 2  . Years of education: 64  . Highest education level: Not on file  Occupational History  . Occupation: Dealer  Social Needs  . Financial resource strain: Not on file  . Food insecurity:    Worry: Not on file    Inability: Not on file  . Transportation needs:    Medical: Not on file    Non-medical: Not on file  Tobacco Use  . Smoking status: Former Smoker    Packs/day: 0.25    Types: Cigarettes    Last attempt to quit: 10/01/2009    Years since quitting: 8.5  . Smokeless tobacco: Never Used  Substance and Sexual Activity  . Alcohol use: Yes    Comment: has a drink twice weekly  . Drug use: No  . Sexual activity: Yes    Birth control/protection: Injection  Lifestyle  . Physical activity:    Days per week: Not on file    Minutes per session: Not on file  . Stress: Not on file  Relationships  . Social connections:    Talks on phone: Not on file    Gets together: Not on file    Attends religious service: Not on file    Active member of club or organization: Not on file    Attends meetings of clubs or organizations: Not on file    Relationship status: Not on file  . Intimate partner violence:    Fear of current or ex partner: Not on file    Emotionally abused: Not on file    Physically abused: Not on file    Forced sexual activity: Not on  file  Other Topics Concern  . Not on file  Social History Narrative   Lives at home with children.   Right-handed.   No caffeine use.    Family History  Problem Relation Age of Onset  . Kidney cancer Father   . Diabetes Mellitus II Father   . Hypertension Father   . Arthritis Mother   . Diabetes Mellitus II  Mother   . Hypertension Mother   . Anesthesia problems Neg Hx     The following portions of the patient's history were reviewed and updated as appropriate: allergies, current medications, past family history, past medical history, past social history, past surgical history and problem list.  Review of Systems Pertinent items are noted in HPI.   Objective:  BP 121/80   Pulse 86   Ht 5' (1.524 m)   Wt 255 lb 11.2 oz (116 kg)   LMP 04/03/2018   BMI 49.94 kg/m  CONSTITUTIONAL: Well-developed, well-nourished female in no acute distress.  HENT:  Normocephalic, atraumatic, External right and left ear normal. Oropharynx is clear and moist EYES: Conjunctivae and EOM are normal. Pupils are equal, round, and reactive to light. No scleral icterus.  NECK: Normal range of motion, supple, no masses.  Normal thyroid.   CARDIOVASCULAR: Normal heart rate noted, regular rhythm RESPIRATORY: Clear to auscultation bilaterally. Effort and breath sounds normal, no problems with respiration noted. BREASTS: Symmetric in size. No masses, skin changes, nipple drainage, or lymphadenopathy. ABDOMEN: Soft, normal bowel sounds, no distention noted.  No tenderness, rebound or guarding.  PELVIC: Normal appearing external genitalia; normal appearing vaginal mucosa and cervix.  No abnormal discharge noted.  Pap smear obtained.  Normal uterine size, no other palpable masses, no uterine or adnexal tenderness. MUSCULOSKELETAL: Normal range of motion. No tenderness.  No cyanosis, clubbing, or edema.  2+ distal pulses. SKIN: Skin is warm and dry. No rash noted. Not diaphoretic. No erythema. No  pallor. NEUROLOGIC: Alert and oriented to person, place, and time. Normal reflexes, muscle tone coordination. No cranial nerve deficit noted. PSYCHIATRIC: Normal mood and affect. Normal behavior. Normal judgment and thought content.  Assessment:  Annual gynecologic examination with pap smear   Plan:  1. Well Woman Exam STD testing discussed. Patient requested testing blood and vaginal Discussed exercise and diet   2. Screen for STD (sexually transmitted disease)  - Hepatitis B surface antigen - Hepatitis C antibody - Cervicovaginal ancillary only  3. Diabetes mellitus type 2 in obese (HCC) - Hemoglobin A1c  4. Menorrhagia Trial of micronor. May need to increase progesterone content. If this fails, may need surgery.  Routine preventative health maintenance measures emphasized. Please refer to After Visit Summary for other counseling recommendations.    Loma Boston, [place] for [employer]

## 2018-04-23 LAB — HEMOGLOBIN A1C
Est. average glucose Bld gHb Est-mCnc: 114 mg/dL
Hgb A1c MFr Bld: 5.6 % (ref 4.8–5.6)

## 2018-04-23 LAB — HEPATITIS C ANTIBODY: Hep C Virus Ab: <0.1 s/co ratio (ref 0.0–0.9)

## 2018-04-23 LAB — HEPATITIS B SURFACE ANTIGEN: Hepatitis B Surface Ag: NEGATIVE

## 2018-04-26 ENCOUNTER — Encounter (INDEPENDENT_AMBULATORY_CARE_PROVIDER_SITE_OTHER): Payer: Self-pay

## 2018-04-26 ENCOUNTER — Telehealth: Payer: Self-pay | Admitting: General Practice

## 2018-04-26 LAB — CERVICOVAGINAL ANCILLARY ONLY
Bacterial vaginitis: POSITIVE — AB
Candida vaginitis: NEGATIVE
Chlamydia: NEGATIVE
Neisseria Gonorrhea: NEGATIVE
Trichomonas: NEGATIVE

## 2018-04-26 NOTE — Telephone Encounter (Signed)
Patient called & left message on nurse voicemail line stating she is calling for results. Called patient, no answer- left message stating we are trying to reach you to return your phone call. I will send you a mychart message with our response.

## 2018-04-29 ENCOUNTER — Other Ambulatory Visit: Payer: Self-pay | Admitting: Family Medicine

## 2018-04-29 ENCOUNTER — Encounter (INDEPENDENT_AMBULATORY_CARE_PROVIDER_SITE_OTHER): Payer: Self-pay

## 2018-04-29 MED ORDER — METRONIDAZOLE 500 MG PO TABS: 500.0000 mg | ORAL_TABLET | Freq: Two times a day (BID) | ORAL | 0 refills | Status: DC

## 2018-08-07 ENCOUNTER — Other Ambulatory Visit: Payer: Self-pay

## 2018-08-07 ENCOUNTER — Encounter (HOSPITAL_COMMUNITY): Payer: Self-pay | Admitting: Emergency Medicine

## 2018-08-07 ENCOUNTER — Emergency Department (HOSPITAL_COMMUNITY)
Admission: EM | Admit: 2018-08-07 | Discharge: 2018-08-07 | Disposition: A | Discharge status: Home / Self Care | Payer: Medicare Other | Attending: Emergency Medicine | Admitting: Emergency Medicine

## 2018-08-07 DIAGNOSIS — W228XXA Striking against or struck by other objects, initial encounter: Secondary | ICD-10-CM | POA: Insufficient documentation

## 2018-08-07 DIAGNOSIS — E119 Type 2 diabetes mellitus without complications: Secondary | ICD-10-CM | POA: Diagnosis not present

## 2018-08-07 DIAGNOSIS — M79674 Pain in right toe(s): Secondary | ICD-10-CM | POA: Diagnosis present

## 2018-08-07 DIAGNOSIS — I10 Essential (primary) hypertension: Secondary | ICD-10-CM | POA: Insufficient documentation

## 2018-08-07 DIAGNOSIS — Z87891 Personal history of nicotine dependence: Secondary | ICD-10-CM | POA: Diagnosis not present

## 2018-08-07 NOTE — ED Triage Notes (Signed)
Patient c/o left great toe pain after a bottle of hot sauce fell on it today.

## 2018-08-07 NOTE — ED Provider Notes (Addendum)
[facility] DEPT Provider Note  CSN: [id number] Arrival date & time: 08/07/18  8563  History   Chief Complaint Chief Complaint  Patient presents with  . Toe Pain    HPI Melinda Ruiz is a 37 y.o. female with a medical history of Type 2 DM and HTN who presented to the ED for right great toe pain. Patient states a 6oz glass bottle of [employer] fell on her foot today during lunch. She was wearing socks and shoes when this occurred. The bottle did not break. Endorses mild tenderness to palpation, but is able to ambulate without issue.   Past Medical History:  Diagnosis Date  . Anemia    during pregancy  . Bipolar disorder ([place])   . Chiari I malformation ([place])   . Complication of anesthesia    "Oxygen level drops"  . Constipation   . Depression   . Diabetes mellitus without complication ([place])   . Family history of adverse reaction to anesthesia    thinks Mom has problem with oxygen level dropping  . Fibroid   . Headache(784.0)   . Hypertension    takes meds daily  . Kidney stones 2010  . Mental disorder    Bi poloar,  Schizohrenia- not taking meds.  Has [facility] at [facility]  . Morbid obesity with BMI of 50.0-59.9, adult ([place])   . Schizophrenia [facility])     Patient Active Problem List   Diagnosis Date Noted  . Chronic migraine 07/30/2017  . Chiari malformation type I ([place]) 07/30/2017  . Diabetes mellitus type 2 in obese ([place]) 10/02/2015  . Low HDL (under 40) 10/02/2015  . OSA (obstructive sleep apnea) 10/02/2015  . 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 SOB (shortness of breath) 04/30/2015  . Dysfunctional uterine bleeding 06/19/2014  . Depo-Provera contraceptive status 06/19/2014  . Morbid obesity with BMI of 50.0-59.9, adult ([place]) 06/08/2013  . Hypertension, benign 06/08/2013  . Menorrhagia 06/08/2013    Past Surgical History:  Procedure Laterality Date  . BREATH TEK H PYLORI N/A 05/31/2015   Procedure: BREATH  TEK H PYLORI;  Surgeon: Greer Pickerel, MD;  Location: [facility];  Service: General;  Laterality: N/A;  . CARPAL TUNNEL RELEASE  02/03/2012   Procedure: CARPAL TUNNEL RELEASE;  Surgeon: Meredith Pel, MD;  Location: [facility];  Service: Orthopedics;  Laterality: Left;  left carpal tunnel release  . CARPAL TUNNEL RELEASE  01/11/2013   Procedure: CARPAL TUNNEL RELEASE;  Surgeon: Meredith Pel, MD;  Location: [facility];  Service: Orthopedics;  Laterality: Right;  Hand Set, Small Retractors, Lead Hand  . CESAREAN SECTION  2006, 2011  . CHOLECYSTECTOMY  06/2011  . cystectomy    . GANGLION CYST EXCISION  02/03/2012   Procedure: REMOVAL GANGLION OF WRIST;  Surgeon: Meredith Pel, MD;  Location: [facility];  Service: Orthopedics;  Laterality: Left;  left dorsal ganglion cyst excision  . LAPAROSCOPIC GASTRIC SLEEVE RESECTION N/A 10/02/2015   Procedure: LAPAROSCOPIC GASTRIC SLEEVE RESECTION;  Surgeon: Greer Pickerel, MD;  Location: [facility];  Service: General;  Laterality: N/A;  . Lymp Glands Removed--under arm bil    . Lympectomy    . MULTIPLE TOOTH EXTRACTIONS  2003   and wisdom teeth  . Pilonidial Cyst Removed  1990's  . TUBAL LIGATION  2011  . UPPER GI ENDOSCOPY  10/02/2015   Procedure: UPPER GI ENDOSCOPY;  Surgeon: Greer Pickerel, MD;  Location: [facility];  Service: General;;     OB  History    Gravida  3   Para  2   Term  2   Preterm  0   AB  1   Living  2     SAB  1   TAB  0   Ectopic  0   Multiple  0   Live Births  2            Home Medications    Prior to Admission medications   Medication Sig Start Date End Date Taking? Authorizing Provider  acetaminophen (TYLENOL) 500 MG tablet Take 1,000 mg by mouth every 6 (six) hours as needed for moderate pain.    [provider]  metroNIDAZOLE (FLAGYL) 500 MG tablet Take 1 tablet (500 mg total) by mouth 2 (two) times daily. 04/29/18   Truett Mainland, DO  norethindrone (MICRONOR,CAMILA,ERRIN) 0.35 MG tablet Take 1 tablet (0.35 mg  total) by mouth daily. 04/22/18   Truett Mainland, DO  pantoprazole (PROTONIX) 20 MG tablet Take 20 mg by mouth daily. Reported on 03/13/2016 02/06/16   [provider]  rizatriptan (MAXALT-MLT) 10 MG disintegrating tablet Take 1 tablet (10 mg total) by mouth as needed. May repeat in 2 hours if needed Patient taking differently: Take 10 mg by mouth as needed for migraine. May repeat in 2 hours if needed 07/30/17   Marcial Pacas, MD  sucralfate (CARAFATE) 1 g tablet TAKE 1 TABLET BY MOUTH EVERY 6 HOURS AS NEEDED FOR ULCERS. Can crush and mix with water or applesauce if needed. 02/25/16   [provider]    Family History Family History  Problem Relation Age of Onset  . Kidney cancer Father   . Diabetes Mellitus II Father   . Hypertension Father   . Arthritis Mother   . Diabetes Mellitus II Mother   . Hypertension Mother   . Anesthesia problems Neg Hx     Social History Social History   Tobacco Use  . Smoking status: Former Smoker    Packs/day: 0.25    Types: Cigarettes    Last attempt to quit: 10/01/2009    Years since quitting: 8.8  . Smokeless tobacco: Never Used  Substance Use Topics  . Alcohol use: Yes    Comment: has a drink twice weekly  . Drug use: No     Allergies   Penicillins and Oxycodone-acetaminophen   Review of Systems Review of Systems  Constitutional: Negative.   Musculoskeletal: Negative for gait problem.       Right great toe pain  Skin: Negative for color change and wound.  Neurological: Negative for weakness and numbness.  Hematological: Does not bruise/bleed easily.     Physical Exam Updated Vital Signs BP (!) 147/95 (BP Location: Right Arm)   Pulse (!) 104   Temp 98.3 F (36.8 C) (Oral)   Resp 18   LMP 07/27/2018   SpO2 98%   Physical Exam  Constitutional:  Obese  Cardiovascular:  Pulses:      Dorsalis pedis pulses are 2+ on the right side, and 2+ on the left side.       Posterior tibial pulses are 2+ on the right side,  and 2+ on the left side.  Musculoskeletal:       Right foot: There is tenderness. There is normal range of motion, no swelling and no deformity.       Left foot: There is normal range of motion and no deformity.       Feet:  Skin: Skin  is warm and intact. Capillary refill takes less than 2 seconds. No abrasion, no bruising and no ecchymosis noted.  Nursing note and vitals reviewed.   ED Treatments / Results  Labs (all labs ordered are listed, but only abnormal results are displayed) Labs Reviewed - No data to display  EKG None  Radiology No results found.  Procedures Procedures (including critical care time)  Medications Ordered in ED Medications - No data to display   Initial Impression / Assessment and Plan / ED Course  Triage vital signs and the nursing notes have been reviewed.  Pertinent labs & imaging results that were available during care of the patient were reviewed and considered in medical decision making (see chart for details).   Patient presents with right great toe pain after a 6 oz glass bottle of hot sauce fell on her foot which was covered with a shoe. Very low risk of fracture due to the mechanism of injury and patient's body habitus. She ambulates without issue and there is not any deformity of the toe or bony tenderness. She does not have any risk factors for osteopenia that raises concern for bone fragility such as malignancy, chronic steroid or EtOH use.  Final Clinical Impressions(s) / ED Diagnoses  1. Right Great Toe Pain. Education provided on OTC and supportive treatment for pain relief. Buddy taped toes in the ED for comfort.  Dispo: Home. After thorough clinical evaluation, this patient is determined to be medically stable and can be safely discharged with the previously mentioned treatment and/or outpatient follow-up/referral(s). At this time, there are no other apparent medical conditions that require further screening, evaluation or  treatment.   Final diagnoses:  Pain of right great toe    ED Discharge Orders    None        Romie Jumper, PA-C 08/07/18 1947    [address] 08/07/18 Doreen Salvage, MD 08/07/18 2101

## 2018-08-07 NOTE — Discharge Instructions (Signed)
I do not believe that your toe is broken. You may buddy tape your toes for the next 2-3 days for relief. Buddy tape can be removed when you are at home, bathing or at bedtime.  You may use Tylenol and/or Ibuprofen (or Naproxen) for pain relief and swelling. You may also put ice on your foot for 15-20 minutes with your foot elevated for swelling.   You may follow-up with your PCP if you continue to have issues for more than 4-6 weeks.

## 2018-08-10 ENCOUNTER — Encounter: Payer: Self-pay | Admitting: Family Medicine

## 2018-08-10 ENCOUNTER — Other Ambulatory Visit: Payer: Self-pay

## 2018-08-10 ENCOUNTER — Ambulatory Visit (INDEPENDENT_AMBULATORY_CARE_PROVIDER_SITE_OTHER): Payer: Medicare Other | Admitting: Family Medicine

## 2018-08-10 VITALS — BP 124/82 | HR 94 | Temp 98.4°F | Ht 60.0 in | Wt 259.4 lb

## 2018-08-10 DIAGNOSIS — Z01818 Encounter for other preprocedural examination: Secondary | ICD-10-CM | POA: Diagnosis not present

## 2018-08-10 DIAGNOSIS — Z9851 Tubal ligation status: Secondary | ICD-10-CM

## 2018-08-10 DIAGNOSIS — I10 Essential (primary) hypertension: Secondary | ICD-10-CM | POA: Diagnosis not present

## 2018-08-10 DIAGNOSIS — Z0000 Encounter for general adult medical examination without abnormal findings: Secondary | ICD-10-CM | POA: Diagnosis present

## 2018-08-10 DIAGNOSIS — E119 Type 2 diabetes mellitus without complications: Secondary | ICD-10-CM | POA: Diagnosis not present

## 2018-08-10 NOTE — Progress Notes (Signed)
[facility]bc   Subjective:   Patient ID: Melinda Ruiz    DOB: 04/13/1981, 37 y.o. female   MRN: [id number]  CC: establish care  HPI: Melinda Ruiz is a 37 y.o. female who presents to clinic today to establish care with new pcp.  Patient received medical care at [facility] previously.  She is from [place].   Establishing with new PCP today.  Current concern: she has h/o tubal ligation in 2011 at [facility], however she and her boyfriend want children.   She would like to discuss her options and see OB-GYN again for possible tubal reversal.     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 No CP, SOB, palpitations.   [place]: Pertinent past medical, surgical, family, and social history were reviewed and updated as appropriate. Smoking status reviewed.   Medications reviewed. Objective:   BP 124/82   Pulse 94   Temp 98.4 F (36.9 C) (Oral)   Ht 5' (1.524 m)   Wt 259 lb 6.4 oz (117.7 kg)   LMP 07/27/2018   SpO2 98%   BMI 50.66 kg/m  Vitals and nursing note reviewed.    General: 37 yo female, NAD  HEENT: NCAT, EOMI, PERRL, MMM, oropharynx clear Neck: supple, non-tender, normal ROM, no LAD  CV: RRR no MRG  Lungs: CTAB, normal effort  Abdomen: soft, NTND, no masses or organomegaly, +bs  Skin: warm, dry, no rash  Extremities: warm and well perfused, normal tone Neuro: alert,  oriented x3, no focal deficits   Assessment & Plan:   History of tubal ligation H/o tubal ligation in 2011, has 2 children via c-section.  She and her current boyfriend now desire children and she would like to discuss options for reversal.  -Referral placed to OB-GYN for evaluation   Patient will return for annual bloodwork.  Future orders placed for CBC, BMP, lipids, a1c.   Orders Placed This Encounter  Procedures  . CBC    Standing Status:   Future    Number of Occurrences:   1    Standing Expiration Date:   08/16/2019  . Basic Metabolic Panel    Standing Status:   Future    Number of Occurrences:   1    Standing Expiration Date:   08/16/2019  . Lipid panel    Standing Status:   Future    Number of Occurrences:   1    Standing Expiration Date:   08/16/2019  . Hemoglobin A1c    Standing Status:   Future    Number of Occurrences:   1    Standing Expiration Date:   08/16/2019  . Ambulatory referral to Obstetrics / Gynecology    Referral Priority:   Routine    Referral Type:   Consultation    Referral Reason:   Specialty Services Required    Requested Specialty:   Obstetrics and Gynecology    Number of Visits Requested:   1  Lovenia Kim, MD [place], PGY-3 08/18/2018 3:25 PM

## 2018-08-10 NOTE — Patient Instructions (Addendum)
It was nice meeting you today.  You were seen in clinic to establish care with me as your new PCP.  We talked about your past medical history, family and surgical history.  We also reviewed medications and allergies, and did a physical exam.  I would like you to follow-up in 1 to 2 weeks for blood work.  Please call if you have any questions.  Lovenia Kim MD

## 2018-08-17 ENCOUNTER — Other Ambulatory Visit: Payer: Medicare Other

## 2018-08-17 DIAGNOSIS — E119 Type 2 diabetes mellitus without complications: Secondary | ICD-10-CM

## 2018-08-17 DIAGNOSIS — I10 Essential (primary) hypertension: Secondary | ICD-10-CM

## 2018-08-18 DIAGNOSIS — Z9851 Tubal ligation status: Secondary | ICD-10-CM | POA: Insufficient documentation

## 2018-08-18 LAB — LIPID PANEL
CHOL/HDL RATIO: 3 ratio (ref 0.0–4.4)
Cholesterol, Total: 130 mg/dL (ref 100–199)
HDL: 44 mg/dL (ref >39)
LDL CALC: 70 mg/dL (ref 0–99)
Triglycerides: 78 mg/dL (ref 0–149)
VLDL Cholesterol Cal: 16 mg/dL (ref 5–40)

## 2018-08-18 LAB — CBC
HEMATOCRIT: 39.1 % (ref 34.0–46.6)
Hemoglobin: 12.4 g/dL (ref 11.1–15.9)
MCH: 27.3 pg (ref 26.6–33.0)
MCHC: 31.7 g/dL (ref 31.5–35.7)
MCV: 86 fL (ref 79–97)
Platelets: 336 10*3/uL (ref 150–450)
RBC: 4.54 x10E6/uL (ref 3.77–5.28)
RDW: 16 % — ABNORMAL HIGH (ref 12.3–15.4)
WBC: 8.8 10*3/uL (ref 3.4–10.8)

## 2018-08-18 LAB — HEMOGLOBIN A1C
ESTIMATED AVERAGE GLUCOSE: 111 mg/dL
HEMOGLOBIN A1C: 5.5 % (ref 4.8–5.6)

## 2018-08-18 LAB — BASIC METABOLIC PANEL
BUN / CREAT RATIO: 13 (ref 9–23)
BUN: 11 mg/dL (ref 6–20)
CO2: 20 mmol/L (ref 20–29)
CREATININE: 0.85 mg/dL (ref 0.57–1.00)
Calcium: 9.5 mg/dL (ref 8.7–10.2)
Chloride: 107 mmol/L — ABNORMAL HIGH (ref 96–106)
GFR calc Af Amer: 103 mL/min/{1.73_m2} (ref >59)
GFR, EST NON AFRICAN AMERICAN: 89 mL/min/{1.73_m2} (ref >59)
GLUCOSE: 129 mg/dL — AB (ref 65–99)
Potassium: 4 mmol/L (ref 3.5–5.2)
SODIUM: 142 mmol/L (ref 134–144)

## 2018-08-18 NOTE — Assessment & Plan Note (Signed)
H/o tubal ligation in 2011, has 2 children via c-section.  She and her current boyfriend now desire children and she would like to discuss options for reversal.  -Referral placed to OB-GYN for evaluation

## 2018-08-25 ENCOUNTER — Telehealth: Payer: Self-pay

## 2018-08-25 NOTE — Telephone Encounter (Signed)
Patient calling for recent lab results.  Call back is [phone]  Danley Danker, RN [facility])

## 2018-09-01 NOTE — Telephone Encounter (Signed)
Pt informed. Deseree Blount, CMA  

## 2018-09-01 NOTE — Telephone Encounter (Signed)
Called pt to discuss lab results, all within normal.  Unable to reach x2 so have left a VM.

## 2018-09-09 ENCOUNTER — Ambulatory Visit (INDEPENDENT_AMBULATORY_CARE_PROVIDER_SITE_OTHER): Payer: Medicare Other | Admitting: Family Medicine

## 2018-09-09 ENCOUNTER — Other Ambulatory Visit (HOSPITAL_COMMUNITY)
Admission: RE | Admit: 2018-09-09 | Discharge: 2018-09-09 | Disposition: A | Discharge status: Home / Self Care | Payer: Medicare Other | Source: Ambulatory Visit | Attending: Family Medicine | Admitting: Family Medicine

## 2018-09-09 ENCOUNTER — Other Ambulatory Visit: Payer: Self-pay

## 2018-09-09 VITALS — BP 118/74 | HR 79 | Temp 98.1°F | Ht 60.0 in | Wt 254.4 lb

## 2018-09-09 DIAGNOSIS — A599 Trichomoniasis, unspecified: Secondary | ICD-10-CM | POA: Diagnosis present

## 2018-09-09 DIAGNOSIS — Z114 Encounter for screening for human immunodeficiency virus [HIV]: Secondary | ICD-10-CM

## 2018-09-09 DIAGNOSIS — Z113 Encounter for screening for infections with a predominantly sexual mode of transmission: Secondary | ICD-10-CM | POA: Diagnosis present

## 2018-09-09 LAB — POCT WET PREP (WET MOUNT): Clue Cells Wet Prep Whiff POC: POSITIVE

## 2018-09-09 MED ORDER — METRONIDAZOLE 500 MG PO TABS: 2000.0000 mg | ORAL_TABLET | Freq: Once | ORAL | 0 refills | Status: AC

## 2018-09-09 NOTE — Progress Notes (Signed)
   Subjective   Patient ID: CAASI GIGLIA    DOB: June 13, 1981, 37 y.o. female   MRN: [id number]  CC: "STD check"  HPI: Kacey Dysert Morgenthaler is a 37 y.o. female who presents to clinic today for the following:  VAGINAL DISCHARGE  Having vaginal discharge for 7 days. Medications tried: no Discharge consistency: thin Discharge color: white to grey Recent antibiotic use: no Sex in last month: yes Possible STD exposure: not sure  Symptoms Fever: no Dysuria: no Vaginal bleeding: no Abdomen or Pelvic pain: some pelvic pain which is chronic, usually around menses Back pain: no Genital sores or ulcers: no Rash: no Pain during sex: no Missed menstrual period: no, LMP about 2 weeks ago, tubes tied  ROS: see HPI for pertinent.  [place]: obesity, HTN, AUB, DM, OSA, migraine, chiari malformation type 1. Surgical tubal, cholecystectomy, carpal tunnel, tooth extraction. Family history DM, HTN. Smoking status reviewed. Medications reviewed.  Objective   BP 118/74   Pulse 79   Temp 98.1 F (36.7 C) (Oral)   Ht 5' (1.524 m)   Wt 254 lb 6.4 oz (115.4 kg)   LMP 08/19/2018 (Approximate)   SpO2 99%   BMI 49.68 kg/m  Vitals and nursing note reviewed.  General: well nourished, well developed, NAD with non-toxic appearance HEENT: normocephalic, atraumatic, moist mucous membranes Cardiovascular: regular rate and rhythm without murmurs, rubs, or gallops Lungs: clear to auscultation bilaterally with normal work of breathing Abdomen: soft, non-tender, non-distended, normoactive bowel sounds GU: accompanied external lesions or rashes, minimal thin gray discharge present without bleeding, minimal odor, cervical os nonfriable, no adnexal tenderness  Assessment & Plan   Trichomonas infection Acute.  Symptomatic.  Positive trichomonas on wet prep.  No signs of BV or candidiasis. - Checking GC/chlamydia, HIV and RPR - Given 2 g of Flagyl instructions to inform partner about need for treatment -  Discussed safe sex practice  Orders Placed This Encounter  Procedures  . RPR  . HIV antibody (with reflex)  . POCT Wet Prep [facility])   Meds ordered this encounter  Medications  . metroNIDAZOLE (FLAGYL) 500 MG tablet    Sig: Take 4 tablets (2,000 mg total) by mouth once for 1 dose.    Dispense:  4 tablet    Refill:  0    Harriet Butte, DO [place], PGY-3 09/09/2018, 12:20 PM

## 2018-09-09 NOTE — Assessment & Plan Note (Signed)
Acute.  Symptomatic.  Positive trichomonas on wet prep.  No signs of BV or candidiasis. - Checking GC/chlamydia, HIV and RPR - Given 2 g of Flagyl instructions to inform partner about need for treatment - Discussed safe sex practice

## 2018-09-09 NOTE — Patient Instructions (Addendum)
Thank you for coming in to see [place] today. Please see below to review our plan for today's visit.  You have a bacterial infection trichomonas.  This is a STD which will be cured with Flagyl.  Take all 4 tablets at once and make sure you avoid alcohol while taking this medication.  Her partner also needs to be treated to prevent spreading or reinfection.  I will call you if there are any abnormalities to your labs, otherwise you will receive results in the mail.  I dissipate receiving results in the next 48 hours.  Please call the clinic at [phone] if your symptoms worsen or you have any concerns. It was our pleasure to serve you.  Harriet Butte, [place], PGY-3

## 2018-09-10 LAB — RPR: RPR: NONREACTIVE

## 2018-09-10 LAB — HIV ANTIBODY (ROUTINE TESTING W REFLEX): HIV SCREEN 4TH GENERATION: NONREACTIVE

## 2018-09-10 LAB — CERVICOVAGINAL ANCILLARY ONLY
Chlamydia: NEGATIVE
Neisseria Gonorrhea: NEGATIVE

## 2018-09-12 ENCOUNTER — Encounter: Payer: Self-pay | Admitting: Family Medicine

## 2018-09-20 ENCOUNTER — Telehealth: Payer: Self-pay | Admitting: Family Medicine

## 2018-10-06 ENCOUNTER — Encounter: Payer: Self-pay | Admitting: Family Medicine

## 2018-10-06 ENCOUNTER — Ambulatory Visit (INDEPENDENT_AMBULATORY_CARE_PROVIDER_SITE_OTHER): Payer: Medicare Other | Admitting: Family Medicine

## 2018-10-06 VITALS — BP 125/85 | HR 95 | Temp 98.5°F | Ht 61.0 in | Wt 254.2 lb

## 2018-10-06 DIAGNOSIS — M25669 Stiffness of unspecified knee, not elsewhere classified: Secondary | ICD-10-CM | POA: Diagnosis present

## 2018-10-06 DIAGNOSIS — Z23 Encounter for immunization: Secondary | ICD-10-CM
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
## 2018-10-06 NOTE — Patient Instructions (Signed)
It was nice seeing you again today.  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  As we discussed, you may benefit from seeing a physical therapist to strengthen and stretch.  You can expect a call within a week or so regarding scheduling this appointment.  You also received your flu shot today in clinic.  Please call clinic if you have any questions.  Be well, Lovenia Kim MD

## 2018-10-11 DIAGNOSIS — M25669 Stiffness of unspecified knee, not elsewhere classified: Secondary | ICD-10-CM | POA: Insufficient documentation

## 2018-10-11 NOTE — Assessment & Plan Note (Signed)
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  Encouraged her to continue stretching when able and walking around to reduce cramping/stiffness.  Unfortunately very little can be done at this time unless she changes or reduces her job duties to another area of the [employer].  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  Normal ROM on exam without concern for mechanical issue within joints bilaterally. Have advised weight loss and discussed  PT which she is agreeable to at this time. PT referral placed; may require OT referral and will place if requested.  Return precautions discussed.

## 2018-10-20 ENCOUNTER — Ambulatory Visit: Payer: Medicare Other | Attending: Family Medicine | Admitting: Physical Therapy

## 2018-10-20 ENCOUNTER — Encounter: Payer: Self-pay | Admitting: Physical Therapy

## 2018-10-20 DIAGNOSIS — M79605 Pain in left leg: Secondary | ICD-10-CM | POA: Insufficient documentation

## 2018-10-20 DIAGNOSIS — R262 Difficulty in walking, not elsewhere classified: Secondary | ICD-10-CM | POA: Diagnosis present

## 2018-10-20 DIAGNOSIS — M79604 Pain in right leg: Secondary | ICD-10-CM | POA: Diagnosis present
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
## 2018-10-21 ENCOUNTER — Ambulatory Visit: Payer: Medicare Other | Admitting: Physical Therapy

## 2018-10-21 ENCOUNTER — Encounter: Payer: Self-pay | Admitting: Physical Therapy

## 2018-10-21 DIAGNOSIS — M79605 Pain in left leg: Secondary | ICD-10-CM | POA: Diagnosis not present

## 2018-10-21 DIAGNOSIS — M79604 Pain in right leg: Secondary | ICD-10-CM

## 2018-10-21 DIAGNOSIS — R262 Difficulty in walking, not elsewhere classified: Secondary | ICD-10-CM

## 2018-10-21 NOTE — Therapy (Addendum)
[place], [place], [address] Phone: [phone]   Fax:  [phone]  Physical Therapy Treatment/discharge   Patient Details  Name: Melinda Ruiz MRN: [id number] Date of Birth: 01-26-1981 Referring Provider (PT): Dr Lovenia Kim    Encounter Date: 10/21/2018  PT End of Session - 10/21/18 0806    Visit Number  2    Number of Visits  12    Date for PT Re-Evaluation  12/01/18    Authorization Type  Medicare    PT Start Time  0800    PT Stop Time  0840    PT Time Calculation (min)  40 min       Past Medical History:  Diagnosis Date  . Anemia    during pregancy  . Bipolar disorder ([place])   . Chiari I malformation ([place])   . Complication of anesthesia    "Oxygen level drops"  . Constipation   . Depression   . Diabetes mellitus without complication ([place])   . Family history of adverse reaction to anesthesia    thinks Mom has problem with oxygen level dropping  . Fibroid   . Headache(784.0)   . Hypertension    takes meds daily  . Kidney stones 2010  . Mental disorder    Bi poloar,  Schizohrenia- not taking meds.  [facility]  . Morbid obesity with BMI of 50.0-59.9, adult ([place])   . Schizophrenia [facility])     Past Surgical History:  Procedure Laterality Date  . BREATH TEK H PYLORI N/A 05/31/2015   Procedure: BREATH TEK H PYLORI;  Surgeon: Greer Pickerel, MD;  Location: [facility];  Service: General;  Laterality: N/A;  . CARPAL TUNNEL RELEASE  02/03/2012   Procedure: CARPAL TUNNEL RELEASE;  Surgeon: Meredith Pel, MD;  Location: [place];  Service: Orthopedics;  Laterality: Left;  left carpal tunnel release  . CARPAL TUNNEL RELEASE  01/11/2013   Procedure: CARPAL TUNNEL RELEASE;  Surgeon: Meredith Pel, MD;  Location: [facility];  Service: Orthopedics;  Laterality: Right;  Hand Set, Small Retractors, Lead Hand  . CESAREAN SECTION  2006, 2011  . CHOLECYSTECTOMY  06/2011  .  cystectomy    . GANGLION CYST EXCISION  02/03/2012   Procedure: REMOVAL GANGLION OF WRIST;  Surgeon: Meredith Pel, MD;  Location: [place];  Service: Orthopedics;  Laterality: Left;  left dorsal ganglion cyst excision  . LAPAROSCOPIC GASTRIC SLEEVE RESECTION N/A 10/02/2015   Procedure: LAPAROSCOPIC GASTRIC SLEEVE RESECTION;  Surgeon: Greer Pickerel, MD;  [facility];  Service: General;  Laterality: N/A;  . Lymp Glands Removed--under arm bil    . Lympectomy    . MULTIPLE TOOTH EXTRACTIONS  2003   and wisdom teeth  . Pilonidial Cyst Removed  1990's  . TUBAL LIGATION  2011  . UPPER GI ENDOSCOPY  10/02/2015   Procedure: UPPER GI ENDOSCOPY;  Surgeon: Greer Pickerel, MD;  [facility];  Service: General;;    There were no vitals filed for this visit.  Subjective Assessment - 10/21/18 0805    Subjective  Doing okay. Tried the tennis ball, still sore.     Currently in Pain?  Yes    Pain Score  7     Pain Location  Leg    Pain Orientation  Left    Pain Descriptors / Indicators  Sore;Tender  [place] Adult PT Treatment/Exercise - 10/21/18 0001      Exercises   Exercises  Knee/Hip      Knee/Hip Exercises: Stretches   Active Hamstring Stretch  3 reps;30 seconds    Hip Flexor Stretch  60 seconds;2 reps    Other Knee/Hip Stretches  single knee to chest 3 x 30 sec     Other Knee/Hip Stretches  Lower trunk rotation x 10       Knee/Hip Exercises: Supine   Quad Sets  20 reps    Short Arc Quad Sets  20 reps    Heel Slides  10 reps    Heel Slides Limitations  without shoe     Straight Leg Raises  5 reps    Straight Leg Raises Limitations  pain anterior , laterior and posterior thigh and knee     Other Supine Knee/Hip Exercises  pelvic tilt x 10       Manual Therapy   Manual therapy comments  IASTM to anterior thigh and lateral thigh in hip flexor stretch and sidelying                PT Short Term Goals - 10/20/18 1439      PT SHORT TERM  GOAL #1   Title  Patient will report decreased tenderness to palpation in the lateral quad    Time  3    Period  Weeks    Status  New    Target Date  11/10/18      PT SHORT TERM GOAL #2   Title  Patient will increase gross left LE strength to 5/5     Time  3    Period  Weeks    Status  New    Target Date  11/10/18      PT SHORT TERM GOAL #3   Title  Patient will increase left hip passive flexion by 20 degrees     Time  3    Period  Weeks    Status  New    Target Date  11/10/18        PT Long Term Goals - 10/20/18 1441      PT LONG TERM GOAL #1   Title  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 in her bus seat for 30 minutes without self reported pain     Time  6    Period  Weeks    Status  New    Target Date  12/01/18      PT LONG TERM GOAL #2   Title  Patient will stand for 1/2 hour without pain in order to perform ADL's     Time  6    Period  Weeks    Status  New    Target Date  12/01/18      PT LONG TERM GOAL #3   Title  Patient will increase left hip flexion mobility to 120 degrees without pain in order to squat without pain     Time  6    Period  Weeks    Status  New    Target Date  12/01/18            Plan - 10/21/18 0834    Clinical Impression Statement  Reviewed HEP and progressed. No progress due to only first treatment. She is on the wait list for next week. Updated HEP with gentle quad strength. Performed IASTM to quad and lasterl thigh. After session she reports thigh feels better and looser however pain  the same in her knee.     PT Next Visit Plan  IASTYm or rolling to the quad; LAD to improve hip flexion; trigger point release to the left quad; review HEP    PT Home Exercise Plan  signle knee to chest stretch; thomas stretch; tennis ball trigger pont release; LTR; Quad set, SAQ, heel slide    Consulted and Agree with Plan of Care  Patient       Patient will benefit from skilled therapeutic intervention in order to improve the following deficits and impairments:   Abnormal gait, Pain, Decreased activity tolerance, Decreased endurance, Decreased range of motion, Decreased strength, Difficulty walking, Increased muscle spasms  Visit Diagnosis: Pain in left leg  Pain in right leg  Difficulty in walking, not elsewhere classified   PHYSICAL THERAPY DISCHARGE SUMMARY  Visits from Start of Care: 2  Current functional level related to goals / functional outcomes: Patient did not return for follow up    Remaining deficits: Unknown    Education / Equipment: Unknown   Plan: Patient agrees to discharge.  Patient goals were not met. Patient is being discharged due to not returning since the last visit.  ?????        Problem List Patient Active Problem List   Diagnosis Date Noted  . Stiffness of joint, lower leg 10/11/2018  . Trichomonas infection 09/09/2018  . History of tubal ligation 08/18/2018  . Chronic migraine 07/30/2017  . Chiari malformation type I ([place]) 07/30/2017  . Diabetes mellitus type 2 in obese ([place]) 10/02/2015  . Low HDL (under 40) 10/02/2015  . OSA (obstructive sleep apnea) 10/02/2015  . 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 SOB (shortness of breath) 04/30/2015  . Dysfunctional uterine bleeding 06/19/2014  . Depo-Provera contraceptive status 06/19/2014  . Morbid obesity with BMI of 50.0-59.9, adult ([place]) 06/08/2013  . Hypertension, benign 06/08/2013  . Menorrhagia 06/08/2013    Dorene Ar, PTA 10/21/2018, 9:01 AM  [facility] [address] [place], [place], [address] Phone: [phone]   Fax:  [phone]  Name: Melinda Ruiz MRN: [id number] Date of Birth: 03-Feb-1981

## 2018-10-26 ENCOUNTER — Other Ambulatory Visit: Payer: Self-pay

## 2018-10-26 ENCOUNTER — Encounter (HOSPITAL_COMMUNITY): Payer: Self-pay

## 2018-10-26 ENCOUNTER — Emergency Department (HOSPITAL_COMMUNITY)
Admission: EM | Admit: 2018-10-26 | Discharge: 2018-10-27 | Disposition: A | Discharge status: Home / Self Care | Payer: No Typology Code available for payment source | Attending: Emergency Medicine | Admitting: Emergency Medicine

## 2018-10-26 DIAGNOSIS — Y999 Unspecified external cause status: Secondary | ICD-10-CM | POA: Insufficient documentation

## 2018-10-26 DIAGNOSIS — Z79899 Other long term (current) drug therapy: Secondary | ICD-10-CM | POA: Diagnosis not present

## 2018-10-26 DIAGNOSIS — S46912A Strain of unspecified muscle, fascia and tendon at shoulder and upper arm level, left arm, initial encounter: Secondary | ICD-10-CM | POA: Diagnosis not present

## 2018-10-26 DIAGNOSIS — S4992XA Unspecified injury of left shoulder and upper arm, initial encounter: Secondary | ICD-10-CM | POA: Diagnosis present

## 2018-10-26 DIAGNOSIS — Y9389 Activity, other specified: Secondary | ICD-10-CM | POA: Diagnosis not present

## 2018-10-26 DIAGNOSIS — I10 Essential (primary) hypertension: Secondary | ICD-10-CM | POA: Diagnosis not present

## 2018-10-26 DIAGNOSIS — Q0700 Arnold-Chiari syndrome without spina bifida or hydrocephalus: Secondary | ICD-10-CM | POA: Insufficient documentation

## 2018-10-26 DIAGNOSIS — Z87891 Personal history of nicotine dependence: Secondary | ICD-10-CM | POA: Diagnosis not present

## 2018-10-26 DIAGNOSIS — E119 Type 2 diabetes mellitus without complications: Secondary | ICD-10-CM | POA: Insufficient documentation

## 2018-10-26 DIAGNOSIS — Y92410 Unspecified street and highway as the place of occurrence of the external cause: Secondary | ICD-10-CM | POA: Insufficient documentation
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
## 2018-10-27 MED ORDER — IBUPROFEN 600 MG PO TABS: 600.0000 mg | ORAL_TABLET | Freq: Three times a day (TID) | ORAL | 0 refills | Status: DC | PRN

## 2018-10-27 MED ORDER — IBUPROFEN 200 MG PO TABS
600.0000 mg | ORAL_TABLET | Freq: Once | ORAL | Status: AC
  Administered 2018-10-27: 600 mg via ORAL
  Filled 2018-10-27: qty 3

## 2018-10-27 MED ORDER — METHOCARBAMOL 750 MG PO TABS: 750.0000 mg | ORAL_TABLET | Freq: Three times a day (TID) | ORAL | 0 refills | Status: DC | PRN
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
## 2018-10-27 NOTE — Discharge Instructions (Addendum)
It was our pleasure to provide your ER care today - we hope that you feel better.  Take motrin as need for pain. Take robaxin as need for muscle spasm/pain - no driving when taking.  Follow up with primary care doctor in 1-2 weeks if symptoms fail to improve/resolve.  Your blood pressure is mildly high tonight - follow up with primary care doctor in the next couple weeks.   Return to ER if worse, new symptoms, severe pain, other concern.

## 2018-10-29 ENCOUNTER — Encounter

## 2018-11-05 ENCOUNTER — Telehealth: Payer: Self-pay | Admitting: Physical Therapy

## 2018-11-05 ENCOUNTER — Ambulatory Visit: Payer: Medicare Other | Attending: Family Medicine | Admitting: Physical Therapy

## 2018-11-05 NOTE — Telephone Encounter (Signed)
Spoke to patient regarding missed appointment this morning. She plans to attend her next scheduled appointment.

## 2018-11-11 ENCOUNTER — Ambulatory Visit: Payer: Medicare Other | Admitting: Physical Therapy

## 2018-11-11 ENCOUNTER — Telehealth: Payer: Self-pay | Admitting: Physical Therapy

## 2018-11-11 NOTE — Telephone Encounter (Signed)
Patient contacted regarding second straight no-show. Patient has not attended PT in over 3 weeks. Therapy left message canceling her last visit per attendance policy. She is welcome to schedule 1 visit at a time if she wishes to continue with therapy.

## 2018-11-18 ENCOUNTER — Ambulatory Visit: Payer: Medicare Other | Admitting: Physical Therapy

## 2018-12-01 ENCOUNTER — Ambulatory Visit: Payer: Medicare Other | Admitting: Family Medicine

## 2018-12-06 ENCOUNTER — Ambulatory Visit: Payer: Medicare Other | Admitting: Family Medicine

## 2018-12-08 NOTE — Telephone Encounter (Signed)
Spoke with pt

## 2018-12-09 ENCOUNTER — Other Ambulatory Visit (HOSPITAL_COMMUNITY)
Admission: RE | Admit: 2018-12-09 | Discharge: 2018-12-09 | Disposition: A | Discharge status: Home / Self Care | Payer: Medicare Other | Source: Ambulatory Visit | Attending: Family Medicine | Admitting: Family Medicine

## 2018-12-09 ENCOUNTER — Encounter: Payer: Self-pay | Admitting: Family Medicine

## 2018-12-09 ENCOUNTER — Ambulatory Visit (INDEPENDENT_AMBULATORY_CARE_PROVIDER_SITE_OTHER): Payer: Medicare Other | Admitting: Family Medicine

## 2018-12-09 VITALS — BP 124/80 | HR 91 | Temp 99.2°F | Wt 268.4 lb

## 2018-12-09 DIAGNOSIS — Z113 Encounter for screening for infections with a predominantly sexual mode of transmission: Secondary | ICD-10-CM | POA: Diagnosis present

## 2018-12-09 LAB — POCT WET PREP (WET MOUNT): Clue Cells Wet Prep Whiff POC: POSITIVE

## 2018-12-09 MED ORDER — METRONIDAZOLE 500 MG PO TABS: 500.0000 mg | ORAL_TABLET | Freq: Two times a day (BID) | ORAL | 0 refills | Status: DC

## 2018-12-09 NOTE — Progress Notes (Signed)
   Subjective:   Patient ID: Melinda Ruiz    DOB: December 05, 1981, 37 y.o. female   MRN: [id number]  CC: vaginal odor   HPI: Melinda Ruiz is a 37 y.o. female who presents to clinic today for the following issue.  Vaginal odor Recently treated for trichomonas in October/November.    Has 1 female sexual partner.   She states her partner told her he had been tested and treated, but her partner was living with his ex-gf at the time.  He claimed he did not have relations with her and would not have contracted it again, however patient would like to get retested again to be sure.  No vaginal discharge, itching or irritation. Has noticed an odor that won't go away.  No dysuria.  No fever, chills.  She does not use any form of birth control.    ROS: See HPI for pertinent ROS.  Social: pt is a former smoker.  Medications reviewed. Objective:   BP 124/80   Pulse 91   Temp 99.2 F (37.3 C) (Oral)   Wt 268 lb 6.4 oz (121.7 kg)   SpO2 100%   BMI 50.71 kg/m  Vitals and nursing note reviewed.  General: 37 year old female, NAD Neck: supple CV: RRR no MRG  Lungs: CTAB, normal effort  Abdomen: soft, NTND, +bs  Pelvic exam: VULVA: normal appearing vulva with no masses, tenderness or lesions, VAGINA: normal appearing vagina with normal color and discharge, no lesions, CERVIX: normal appearing cervix without discharge or lesions. Skin: warm, dry, no rash  Extremities: warm and well perfused  Assessment & Plan:   Vaginal odor Wet prep +for trichomonas GC chlamydia pending -Rx: Flagyl 500 mg BID x7 days  Extensive counseling provided to ensure her partner is tested and treated as well Follow-up as needed  Orders Placed This Encounter  Procedures  . POCT Wet Prep [facility])   Meds ordered this encounter  Medications  . metroNIDAZOLE (FLAGYL) 500 MG tablet    Sig: Take 1 tablet (500 mg total) by mouth 2 (two) times daily for 7 days.    Dispense:  14 tablet    Refill:  0   Lovenia Kim, MD [place]

## 2018-12-09 NOTE — Patient Instructions (Signed)
It was nice seeing you again today.  You were seen in clinic for vaginal odor which may be an indication of a vaginal infection.  We did some testing today and I will give you a call once the results of this are back.  I will go ahead and send something into the pharmacy if it is positive.  Please call clinic if any questions.

## 2018-12-10 LAB — CERVICOVAGINAL ANCILLARY ONLY
Chlamydia: NEGATIVE
NEISSERIA GONORRHEA: NEGATIVE

## 2018-12-27 ENCOUNTER — Other Ambulatory Visit: Payer: Self-pay | Admitting: Family Medicine

## 2018-12-27 NOTE — Telephone Encounter (Signed)
Patient is requesting this refill because while she was being treated with this medication she developed a GI bug and was constantly throwing up. Is concerned she did not have enough medication to treat problem.  Call back is [phone]  Danley Danker, RN [facility] [place])

## 2019-01-17 ENCOUNTER — Ambulatory Visit (INDEPENDENT_AMBULATORY_CARE_PROVIDER_SITE_OTHER): Payer: Medicare Other | Admitting: Family Medicine

## 2019-01-17 VITALS — BP 140/85 | HR 84 | Temp 98.0°F | Wt 268.4 lb

## 2019-01-17 DIAGNOSIS — Z6841 Body Mass Index (BMI) 40.0 and over, adult: Secondary | ICD-10-CM

## 2019-01-17 DIAGNOSIS — F32A Depression, unspecified: Secondary | ICD-10-CM | POA: Insufficient documentation

## 2019-01-17 DIAGNOSIS — F321 Major depressive disorder, single episode, moderate: Secondary | ICD-10-CM | POA: Diagnosis not present

## 2019-01-17 DIAGNOSIS — F329 Major depressive disorder, single episode, unspecified: Secondary | ICD-10-CM | POA: Diagnosis not present

## 2019-01-17 MED ORDER — SERTRALINE HCL 25 MG PO TABS: 25.0000 mg | ORAL_TABLET | Freq: Every day | ORAL | 0 refills | Status: DC

## 2019-01-17 NOTE — Assessment & Plan Note (Signed)
Reviewed goals with patient, she reports she is motivated to lose weight and is interested in meeting with a nutritionist to work on her diet. -Given information for Dr. Jenne Campus, pt will call and schedule this -f/u 1 mo

## 2019-01-17 NOTE — Progress Notes (Signed)
   Subjective:   Patient ID: Melinda Ruiz    DOB: [birth year], 38 y.o. female   MRN: [id number]  CC: discuss depression   HPI: [name] is a 38 y.o. female who presents to clinic today for the following issue.  Feeling depressed  Diagnosed with major depression in 2009, was taking for 2-3 years but has not been taking it.   Does not recall which medication she used to take.   For the past 3 months, she reports being easily irritable and agitated, occurs both in her home and in work setting.  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  Snaps at her children more often than normal, her daughter states she is "being mean" to her.  Loss of interest in activities she used to enjoy ie going out with her children, prefers to stay home now.  Has been eating more, she has been sleeping less and tends to wake up earlier in the morning recently Has not been exercising lately as she has not paid her late fees at the gym but plans on restarting this soon.   She denies auditory and visual hallucinations.   No history of SI/HI reported.   Morbid obesity, BMI 50.71 H/o gastric sleeve Oct 2016, feels like she has gone back to her old eating habits and eats much more than she should be at a time. She is interested in following with a nutritionist to work on this.  Has plans to start going to the gym again soon once she pays off her late fees.   ROS: No fever, chills, nausea, vomiting.  Social: pt is a former smoker, quit 2010  Medications reviewed. Objective:   BP 140/85   Pulse 84   Temp 98 F (36.7 C)   Wt 268 lb 6.4 oz (121.7 kg)   SpO2 100%   BMI 50.71 kg/m  Vitals and nursing note reviewed.  General: obese 38 year old female, NAD Neck: supple CV: RRR no MRG  Lungs: CTAB, normal effort  Abdomen: soft, +bs  Skin: warm, dry, no rash Extremities: warm and well perfused, normal tone Neuro: alert, oriented x3, no focal deficits     Assessment & Plan:   Morbid obesity with BMI of 50.0-59.9, adult [facility]) Reviewed goals with patient, she reports she is motivated to lose weight and is interested in meeting with a nutritionist to work on her diet. -Given information for Dr. Jenne Campus, pt will call and schedule this -f/u 1 mo  Depression Patient describes several symptoms of major depressive disorder.  PHQ- 9 score is 17, GAD-7 score 17 at OV today indicative of severe symptoms.  No history of or current SI/HI, no red flags on exam.  She reports several stressors at work and at home.  Discussed that she would benefit from starting an antidepressant. -Rx: Zoloft 25 mg daily -Side effects and black box warning reviewed - Follow-up in 4 weeks to reevaluate, will redo PHQ-9 and GAD-7 at that time to compare   Meds ordered this encounter  Medications  . sertraline (ZOLOFT) 25 MG tablet    Sig: Take 1 tablet (25 mg total) by mouth daily.    Dispense:  60 tablet    Refill:  0   Follow up: 4 weeks or sooner if needed   Lovenia Kim, MD [place] PGY-3

## 2019-01-17 NOTE — Assessment & Plan Note (Signed)
Patient describes several symptoms of major depressive disorder.  PHQ- 9 score is 17, GAD-7 score 17 at OV today indicative of severe symptoms.  No history of or current SI/HI, no red flags on exam.  She reports several stressors at work and at home.  Discussed that she would benefit from starting an antidepressant. -Rx: Zoloft 25 mg daily -Side effects and black box warning reviewed - Follow-up in 4 weeks to reevaluate, will redo PHQ-9 and GAD-7 at that time to compare

## 2019-01-17 NOTE — Patient Instructions (Addendum)
It was nice seeing you again today.  You were seen in clinic to discuss depression and possibly starting a medication for your symptoms.  As we discussed, I think you would likely benefit from starting an antidepressant and I have sent in a prescription for Zoloft for you.   Start by taking half the prescribed medication daily and after a week you may take a full tablet.  We reviewed side effects of this medication and if you have any suicidal thoughts, please discontinue the medication and let me know.  I would like you to follow-up in 1 month to reevaluate your symptoms.  I have also provided you with a card for Dr. Jenne Campus, our nutritionist.  Dennis Bast may call her to schedule an appointment at your convenience and follow-up with her.  Please call clinic if you have any questions.  Lovenia Kim MD   Sertraline tablets What is this medicine? SERTRALINE (SER tra leen) is used to treat depression. It may also be used to treat obsessive compulsive disorder, panic disorder, post-trauma stress, premenstrual dysphoric disorder (PMDD) or social anxiety. This medicine may be used for other purposes; ask your health care provider or pharmacist if you have questions. COMMON BRAND NAME(S): Zoloft What should I tell my health care provider before I take this medicine? They need to know if you have any of these conditions: -bleeding disorders -bipolar disorder or a family history of bipolar disorder -glaucoma -heart disease -high blood pressure -history of irregular heartbeat -history of low levels of calcium, magnesium, or potassium in the blood -if you often drink alcohol -liver disease -receiving electroconvulsive therapy -seizures -suicidal thoughts, plans, or attempt; a previous suicide attempt by you or a family member -take medicines that treat or prevent blood clots -thyroid disease -an unusual or allergic reaction to sertraline, other medicines, foods, dyes, or preservatives -pregnant or trying  to get pregnant -breast-feeding How should I use this medicine? Take this medicine by mouth with a glass of water. Follow the directions on the prescription label. You can take it with or without food. Take your medicine at regular intervals. Do not take your medicine more often than directed. Do not stop taking this medicine suddenly except upon the advice of your doctor. Stopping this medicine too quickly may cause serious side effects or your condition may worsen. A special MedGuide will be given to you by the pharmacist with each prescription and refill. Be sure to read this information carefully each time. Talk to your pediatrician regarding the use of this medicine in children. While this drug may be prescribed for children as young as 7 years for selected conditions, precautions do apply. Overdosage: If you think you have taken too much of this medicine contact a poison control center or emergency room at once. NOTE: This medicine is only for you. Do not share this medicine with others. What if I miss a dose? If you miss a dose, take it as soon as you can. If it is almost time for your next dose, take only that dose. Do not take double or extra doses. What may interact with this medicine? Do not take this medicine with any of the following medications: -cisapride -dofetilide -dronedarone -linezolid -MAOIs like Carbex, Eldepryl, Marplan, Nardil, and Parnate -methylene blue (injected into a vein) -pimozide -thioridazine This medicine may also interact with the following medications: -alcohol -amphetamines -aspirin and aspirin-like medicines -certain medicines for depression, anxiety, or psychotic disturbances -certain medicines for fungal infections like ketoconazole, fluconazole, posaconazole, and itraconazole -  certain medicines for irregular heart beat like flecainide, quinidine, propafenone -certain medicines for migraine headaches like almotriptan, eletriptan, frovatriptan,  naratriptan, rizatriptan, sumatriptan, zolmitriptan -certain medicines for sleep -certain medicines for seizures like carbamazepine, valproic acid, phenytoin -certain medicines that treat or prevent blood clots like warfarin, enoxaparin, dalteparin -cimetidine -digoxin -diuretics -fentanyl -isoniazid -lithium -NSAIDs, medicines for pain and inflammation, like ibuprofen or naproxen -other medicines that prolong the QT interval (cause an abnormal heart rhythm) -rasagiline -safinamide -supplements like St. John's wort, kava kava, valerian -tolbutamide -tramadol -tryptophan This list may not describe all possible interactions. Give your health care provider a list of all the medicines, herbs, non-prescription drugs, or dietary supplements you use. Also tell them if you smoke, drink alcohol, or use illegal drugs. Some items may interact with your medicine. What should I watch for while using this medicine? Tell your doctor if your symptoms do not get better or if they get worse. Visit your doctor or health care professional for regular checks on your progress. Because it may take several weeks to see the full effects of this medicine, it is important to continue your treatment as prescribed by your doctor. Patients and their families should watch out for new or worsening thoughts of suicide or depression. Also watch out for sudden changes in feelings such as feeling anxious, agitated, panicky, irritable, hostile, aggressive, impulsive, severely restless, overly excited and hyperactive, or not being able to sleep. If this happens, especially at the beginning of treatment or after a change in dose, call your health care professional. Dennis Bast may get drowsy or dizzy. Do not drive, use machinery, or do anything that needs mental alertness until you know how this medicine affects you. Do not stand or sit up quickly, especially if you are an older patient. This reduces the risk of dizzy or fainting spells.  Alcohol may interfere with the effect of this medicine. Avoid alcoholic drinks. Your mouth may get dry. Chewing sugarless gum or sucking hard candy, and drinking plenty of water may help. Contact your doctor if the problem does not go away or is severe. What side effects may I notice from receiving this medicine? Side effects that you should report to your doctor or health care professional as soon as possible: -allergic reactions like skin rash, itching or hives, swelling of the face, lips, or tongue -anxious -black, tarry stools -changes in vision -confusion -elevated mood, decreased need for sleep, racing thoughts, impulsive behavior -eye pain -fast, irregular heartbeat -feeling faint or lightheaded, falls -feeling agitated, angry, or irritable -hallucination, loss of contact with reality -loss of balance or coordination -loss of memory -painful or prolonged erections -restlessness, pacing, inability to keep still -seizures -stiff muscles -suicidal thoughts or other mood changes -trouble sleeping -unusual bleeding or bruising -unusually weak or tired -vomiting Side effects that usually do not require medical attention (report to your doctor or health care professional if they continue or are bothersome): -change in appetite or weight -change in sex drive or performance -diarrhea -increased sweating -indigestion, nausea -tremors This list may not describe all possible side effects. Call your doctor for medical advice about side effects. You may report side effects to FDA at 1-800-FDA-1088. Where should I keep my medicine? Keep out of the reach of children. Store at room temperature between 15 and 30 degrees C (59 and 86 degrees F). Throw away any unused medicine after the expiration date. NOTE: This sheet is a summary. It may not cover all possible information. If you have questions about  this medicine, talk to your doctor, pharmacist, or health care provider.  2019  Elsevier/Gold Standard (2016-12-19 14:17:49)

## 2019-01-31 ENCOUNTER — Encounter: Payer: Medicare Other | Admitting: Family Medicine

## 2019-04-04 ENCOUNTER — Telehealth: Payer: Self-pay | Admitting: Family Medicine
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
## 2019-05-24 ENCOUNTER — Other Ambulatory Visit: Payer: Self-pay | Admitting: General Surgery

## 2019-05-24 ENCOUNTER — Other Ambulatory Visit (HOSPITAL_COMMUNITY): Payer: Self-pay | Admitting: General Surgery

## 2019-05-24 DIAGNOSIS — Z9884 Bariatric surgery status: Secondary | ICD-10-CM

## 2019-05-30 ENCOUNTER — Other Ambulatory Visit: Payer: Self-pay

## 2019-05-30 MED ORDER — NORETHINDRONE 0.35 MG PO TABS: 1.0000 | ORAL_TABLET | Freq: Every day | ORAL | 11 refills | Status: DC

## 2019-06-03 ENCOUNTER — Ambulatory Visit (HOSPITAL_COMMUNITY)
Admission: RE | Admit: 2019-06-03 | Discharge: 2019-06-03 | Disposition: A | Discharge status: Home / Self Care | Payer: Medicare Other | Source: Ambulatory Visit | Attending: General Surgery | Admitting: General Surgery

## 2019-06-03 ENCOUNTER — Other Ambulatory Visit: Payer: Self-pay

## 2019-06-03 ENCOUNTER — Other Ambulatory Visit (HOSPITAL_COMMUNITY): Payer: Self-pay | Admitting: General Surgery

## 2019-06-03 DIAGNOSIS — Z9884 Bariatric surgery status: Secondary | ICD-10-CM | POA: Diagnosis present

## 2019-06-22 ENCOUNTER — Other Ambulatory Visit: Payer: Self-pay | Admitting: Family Medicine

## 2019-07-26 ENCOUNTER — Ambulatory Visit (INDEPENDENT_AMBULATORY_CARE_PROVIDER_SITE_OTHER): Payer: Medicare Other | Admitting: Student in an Organized Health Care Education/Training Program

## 2019-07-26 ENCOUNTER — Other Ambulatory Visit: Payer: Self-pay

## 2019-07-26 VITALS — BP 130/80 | HR 114

## 2019-07-26 DIAGNOSIS — G5602 Carpal tunnel syndrome, left upper limb: Secondary | ICD-10-CM

## 2019-07-26 MED ORDER — DICLOFENAC SODIUM 1 % TD GEL: 2.0000 g | Freq: Four times a day (QID) | TRANSDERMAL | 0 refills | Status: DC

## 2019-07-26 NOTE — Patient Instructions (Addendum)
It was a pleasure to see you today!  To summarize our discussion for this visit:  It looks like you are having an exacerbation of your carpal tunnel syndrome.  I am referring you to occupational therapy where they can help fit you for a wrist brace.   I am also prescribing a topical NSAID  Some additional health maintenance measures we should update are: Health Maintenance Due  Topic Date Due  . PNEUMOCOCCAL POLYSACCHARIDE VACCINE AGE 37-64 HIGH RISK  11/04/1984  . FOOT EXAM  11/04/1992  . OPHTHALMOLOGY EXAM  11/04/1992  . URINE MICROALBUMIN  11/04/1992  . TETANUS/TDAP  11/04/2001  . HEMOGLOBIN A1C  02/17/2019  .   Please return to our clinic to see me please come back in about a month to check on progress and to update your health maintenance that are listed above.  Call the clinic at [phone] if your symptoms worsen or you have any concerns.   Thank you for allowing me to take part in your care,  Dr. Doristine Mango

## 2019-07-26 NOTE — Progress Notes (Signed)
   Subjective:    Patient ID: Melinda Ruiz, female    DOB: 1981-05-09, 38 y.o.   MRN: [id number]   CC: hand numbness  HPI:  Patient presents with recent worsening of numbness to left hand.  Patient states that at certain positions exacerbate the numbness that is of her first 3 digits on her left hand.  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 She has never lost motor function or sensation to touch in the hand.  She has noticed decreased strength in her grip at times.  The sensation is improved by shaking her hand out.  She has a history of carpal tunnel release surgery on both hands.  This improved her symptoms until this new recurrence.  Smoking status reviewed   ROS: pertinent noted in the HPI   Past medical history, surgical, family, and social history reviewed and updated in the EMR as appropriate.  Objective:  BP 130/80   Pulse (!) 114   SpO2 100%   Vitals and nursing note reviewed  General: NAD, pleasant, able to participate in exam Extremities: no edema or cyanosis. Hand: Full painless range of motion in bilateral hands and wrists. Tinel's test was acutely positive on left hand.  Phalen's test was negative.  Finkelstein's test negative.  Negative for thenar eminence atrophy. Skin: warm and dry, no rashes noted Neuro: alert, no obvious focal deficits Psych: Normal affect and mood   Assessment & Plan:    Carpal tunnel syndrome of left wrist -Recommended patient use splint at night -Referring patient to occupational therapy to assist in work related dysfunction. -Prescribed topical NSAID gel as patient is intolerant to oral ibuprofen -Follow-up if not improving with conservative measures    Doristine Mango, [place] PGY-2

## 2019-07-27 ENCOUNTER — Encounter: Payer: Self-pay | Admitting: Gastroenterology

## 2019-07-27 ENCOUNTER — Ambulatory Visit (INDEPENDENT_AMBULATORY_CARE_PROVIDER_SITE_OTHER): Payer: Medicare Other | Admitting: Gastroenterology

## 2019-07-27 VITALS — Ht 61.0 in | Wt 283.0 lb

## 2019-07-27 DIAGNOSIS — K219 Gastro-esophageal reflux disease without esophagitis: Secondary | ICD-10-CM

## 2019-07-27 MED ORDER — PANTOPRAZOLE SODIUM 40 MG PO TBEC: 40.0000 mg | DELAYED_RELEASE_TABLET | Freq: Every day | ORAL | 0 refills | Status: DC

## 2019-07-27 NOTE — Progress Notes (Signed)
This patient contacted our office requesting a physician telemedicine consultation regarding clinical questions and/or test results. Due to COVID restrictions, this was felt to be the most appropriate method of patient evaluation. If new patient, they were referred by Melinda Pickerel, MD [facility])  Participants on the conference : myself and patient   The patient consented to this consultation and was aware that a charge will be placed through their insurance.  They were also made aware of the limitations of telemedicine.  I was in my office and the patient was at home.  Total time 45 minutes, 35 spent in discussion with patient on [employer]  _____________________________________________________________________________________________              [facility] Consult Note:  History: Melinda Ruiz 07/27/2019  Referring provider: see above  Reason for consult/chief complaint: Gastroesophageal Reflux (vomits acid)   Subjective  HPI: This is a 38 year old woman referred by Dr. Greer Ruiz of surgery regarding reflux.  She underwent gastric sleeve surgery for morbid obesity in October 2016 and had initial weight loss, but has since put nearly all the weight back on.  I have an office note from Dr. Redmond Ruiz in late May of this year described the patient's frustrations and his recommendation regarding referral to nutrition and behavioral management as well as consideration of eventual conversion to Roux-en-Y gastric bypass.  She has had frequent heartburn and regurgitation both daytime and night.  Sometimes food feels like it "just sits" in the upper abdomen.  She denies dysphagia or odynophagia.  Symptoms persist despite Protonix 20 mg daily.  Carafate was added but she has not found it helpful.  Her comorbidities have included obstructive sleep apnea, type 2 diabetes, fatty liver and hypertension.  ROS:  Review of Systems  Constitutional: Negative for  appetite change and unexpected weight change.  HENT: Negative for mouth sores and voice change.   Eyes: Negative for pain and redness.  Respiratory: Negative for cough and shortness of breath.   Cardiovascular: Negative for chest pain and palpitations.  Genitourinary: Negative for dysuria and hematuria.  Musculoskeletal: Negative for arthralgias and myalgias.  Skin: Negative for pallor and rash.  Neurological: Negative for weakness and headaches.  Hematological: Negative for adenopathy.  Psychiatric/Behavioral:       Depression and anxiety     Past Medical History: Past Medical History:  Diagnosis Date  . Anemia    during pregancy  . Bipolar disorder (Melinda Ruiz)   . Chiari I malformation (Melinda Ruiz)   . Complication of anesthesia    "Oxygen level drops"  . Constipation   . Depression   . Diabetes mellitus without complication ([place])   . Family history of adverse reaction to anesthesia    thinks Mom has problem with oxygen level dropping  . Fibroid   . Headache(784.0)   . Hypertension    takes meds daily  . Kidney stones 2010  . Mental disorder    Bi poloar,  Schizohrenia- not taking meds.  Has been seen at [facility]  . Morbid obesity with BMI of 50.0-59.9, adult (Melinda Ruiz)   . Schizophrenia [facility])      Past Surgical History: Past Surgical History:  Procedure Laterality Date  . BREATH TEK H PYLORI N/A 05/31/2015   Procedure: BREATH TEK H PYLORI;  Surgeon: Melinda Pickerel, MD;  Location: [facility];  Service: General;  Laterality: N/A;  . CARPAL TUNNEL RELEASE  02/03/2012   Procedure: CARPAL TUNNEL RELEASE;  Surgeon: Meredith Pel, MD;  Location:  [facility];  Service: Orthopedics;  Laterality: Left;  left carpal tunnel release  . CARPAL TUNNEL RELEASE  01/11/2013   Procedure: CARPAL TUNNEL RELEASE;  Surgeon: Meredith Pel, MD;  Location: [facility];  Service: Orthopedics;  Laterality: Right;  Hand Set, Small Retractors, Lead Hand  . CESAREAN SECTION  2006, 2011  . CHOLECYSTECTOMY  06/2011   . cystectomy    . GANGLION CYST EXCISION  02/03/2012   Procedure: REMOVAL GANGLION OF WRIST;  Surgeon: Meredith Pel, MD;  Location: [facility];  Service: Orthopedics;  Laterality: Left;  left dorsal ganglion cyst excision  . LAPAROSCOPIC GASTRIC SLEEVE RESECTION N/A 10/02/2015   Procedure: LAPAROSCOPIC GASTRIC SLEEVE RESECTION;  Surgeon: Melinda Pickerel, MD;  Location: [facility];  Service: General;  Laterality: N/A;  . Lymp Glands Removed--under arm bil    . Lympectomy    . MULTIPLE TOOTH EXTRACTIONS  2003   and wisdom teeth  . Pilonidial Cyst Removed  1990's  . TUBAL LIGATION  2011  . UPPER GI ENDOSCOPY  10/02/2015   Procedure: UPPER GI ENDOSCOPY;  Surgeon: Melinda Pickerel, MD;  Location: [facility];  Service: General;;     Family History: Family History  Problem Relation Age of Onset  . Kidney cancer Father   . Diabetes Mellitus II Father   . Hypertension Father   . Arthritis Mother   . Diabetes Mellitus II Mother   . Hypertension Mother   . Anesthesia problems Neg Hx     Social History: Social History   Socioeconomic History  . Marital status: Single    Spouse name: Not on file  . Number of children: 2  . Years of education: 19  . Highest education level: Not on file  Occupational History  . Occupation: Dealer  Social Needs  . Financial resource strain: Not on file  . Food insecurity    Worry: Not on file    Inability: Not on file  . Transportation needs    Medical: Not on file    Non-medical: Not on file  Tobacco Use  . Smoking status: Former Smoker    Packs/day: 0.25    Types: Cigarettes    Quit date: 10/01/2009    Years since quitting: 9.8  . Smokeless tobacco: Never Used  Substance and Sexual Activity  . Alcohol use: Yes    Comment: has a drink twice weekly  . Drug use: No  . Sexual activity: Yes    Birth control/protection: Pill  Lifestyle  . Physical activity    Days per week: Not on file    Minutes per session: Not on file  . Stress: Not on  file  Relationships  . Social Herbalist on phone: Not on file    Gets together: Not on file    Attends religious service: Not on file    Active member of club or organization: Not on file    Attends meetings of clubs or organizations: Not on file    Relationship status: Not on file  Other Topics Concern  . Not on file  Social History Narrative   Lives at home with children.   Right-handed.   No caffeine use.    Allergies: Allergies  Allergen Reactions  . Ibuprofen Itching  . Penicillins Hives    .Has patient had a PCN reaction causing immediate rash, facial/tongue/throat swelling, SOB or lightheadedness with hypotension: No Has patient had a PCN reaction causing severe rash involving mucus membranes or skin necrosis:  No Has patient had a PCN reaction that required hospitalization No Has patient had a PCN reaction occurring within the last 10 years: No If all of the above answers are "NO", then may proceed with Cephalosporin use.  Marland Kitchen Oxycodone-Acetaminophen Itching    Outpatient Meds: Current Outpatient Medications  Medication Sig Dispense Refill  . acetaminophen (TYLENOL) 500 MG tablet Take 1,000 mg by mouth every 6 (six) hours as needed for moderate pain.    Marland Kitchen diclofenac sodium (VOLTAREN) 1 % GEL Apply 2 g topically 4 (four) times daily. 150 g 0  . methocarbamol (ROBAXIN) 750 MG tablet Take 1 tablet (750 mg total) by mouth 3 (three) times daily as needed for muscle spasms. 15 tablet 0  . norethindrone (MICRONOR) 0.35 MG tablet Take 1 tablet (0.35 mg total) by mouth daily. 1 Package 11  . rizatriptan (MAXALT-MLT) 10 MG disintegrating tablet Take 1 tablet (10 mg total) by mouth as needed. May repeat in 2 hours if needed (Patient taking differently: Take 10 mg by mouth as needed for migraine. May repeat in 2 hours if needed) 15 tablet 6  . sertraline (ZOLOFT) 25 MG tablet Take 1 tablet (25 mg total) by mouth daily. 60 tablet 0  . sucralfate (CARAFATE) 1 g tablet TAKE 1  TABLET BY MOUTH EVERY 6 HOURS AS NEEDED FOR ULCERS. Can crush and mix with water or applesauce if needed.    . pantoprazole (PROTONIX) 40 MG tablet Take 1 tablet (40 mg total) by mouth daily. 90 tablet 0   No current facility-administered medications for this visit.       ___________________________________________________________________ Objective   Exam:  Ht 5\' 1"  (1.549 m)   Wt 283 lb (128.4 kg)   BMI 53.47 kg/m    No exam - virtual visit     Radiologic Studies: CLINICAL DATA:  Prior gastric sleeve.   EXAM: UPPER GI SERIES WITH KUB   TECHNIQUE: After obtaining a scout radiograph a routine upper GI series was performed using thin barium   FLUOROSCOPY TIME:  Fluoroscopy Time:  2 minutes 36 seconds   Radiation Exposure Index (if provided by the fluoroscopic device):   Number of Acquired Spot Images: 0   COMPARISON:  02/15/2016   FINDINGS: Scout film demonstrates normal bowel gas pattern. Prior cholecystectomy.   Fluoroscopic evaluation of swallowing demonstrates normal appearance of the esophagus. No stricture, fold thickening or mass. Normal esophageal peristalsis.   Changes of gastric sleeve. No obstruction or leak. Normal emptying of the stomach into the small bowel. Duodenal bulb and duodenal sweep have a normal appearance. No ulceration, mass or fold thickening.   Small amount of reflux noted with the water siphon maneuver.   IMPRESSION: Postoperative appearance of the stomach with changes of gastric sleeve. No evidence of gastric obstruction or leak.   Trace gastroesophageal reflux.   No acute findings.     Electronically Signed   By: Rolm Baptise M.D.   On: 06/03/2019 10:14    Assessment: Encounter Diagnoses  Name Primary?  . Gastroesophageal reflux disease, esophagitis presence not specified Yes  . Morbid obesity ([place])     She has GERD based on symptoms and upper GI findings.  No gastric or esophageal stricture seen on imaging.   Persistent symptoms despite low-dose acid suppression, most likely related to postsurgical anatomy, morbid obesity and dietary discretion.  I discussed the ability but also limitation of acid suppression therapy to control heartburn, and its great limitation reducing regurgitation, especially laying down.  We discussed  diet and lifestyle measures that are needed.  I increase the Protonix to 40 mg once daily, but I suspect she will still have persistent symptoms until the other issues can be attended to.  She does not need an upper endoscopy at this juncture, and is referred back to Dr. Redmond Ruiz for further discussions regarding bariatrics.  Thank you for the courtesy of this consult.  Please call me with any questions or concerns.  Nelida Meuse III  CC: Referring provider noted above

## 2019-07-27 NOTE — Patient Instructions (Addendum)
I

## 2019-08-10 ENCOUNTER — Encounter (INDEPENDENT_AMBULATORY_CARE_PROVIDER_SITE_OTHER): Payer: Self-pay | Admitting: Family Medicine

## 2019-08-10 ENCOUNTER — Other Ambulatory Visit: Payer: Self-pay

## 2019-08-10 ENCOUNTER — Other Ambulatory Visit (INDEPENDENT_AMBULATORY_CARE_PROVIDER_SITE_OTHER): Payer: Self-pay | Admitting: Family Medicine

## 2019-08-10 ENCOUNTER — Ambulatory Visit (INDEPENDENT_AMBULATORY_CARE_PROVIDER_SITE_OTHER): Payer: Medicare Other | Admitting: Family Medicine

## 2019-08-10 VITALS — BP 122/80 | HR 89 | Temp 98.1°F | Ht 62.0 in | Wt 283.0 lb

## 2019-08-10 DIAGNOSIS — G4733 Obstructive sleep apnea (adult) (pediatric): Secondary | ICD-10-CM

## 2019-08-10 DIAGNOSIS — Z1331 Encounter for screening for depression: Secondary | ICD-10-CM

## 2019-08-10 DIAGNOSIS — Z0289 Encounter for other administrative examinations: Secondary | ICD-10-CM

## 2019-08-10 DIAGNOSIS — E559 Vitamin D deficiency, unspecified: Secondary | ICD-10-CM

## 2019-08-10 DIAGNOSIS — R5383 Other fatigue: Secondary | ICD-10-CM | POA: Diagnosis not present

## 2019-08-10 DIAGNOSIS — R0602 Shortness of breath: Secondary | ICD-10-CM

## 2019-08-10 DIAGNOSIS — E66813 Obesity, class 3: Secondary | ICD-10-CM

## 2019-08-10 DIAGNOSIS — Z6841 Body Mass Index (BMI) 40.0 and over, adult: Secondary | ICD-10-CM

## 2019-08-10 DIAGNOSIS — E1165 Type 2 diabetes mellitus with hyperglycemia: Secondary | ICD-10-CM | POA: Diagnosis not present
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
## 2019-08-11 LAB — CBC WITH DIFFERENTIAL
Basophils Absolute: 0.1 10*3/uL (ref 0.0–0.2)
Basos: 1 %
EOS (ABSOLUTE): 0.2 10*3/uL (ref 0.0–0.4)
Eos: 2 %
Hematocrit: 41 % (ref 34.0–46.6)
Hemoglobin: 12.9 g/dL (ref 11.1–15.9)
Immature Grans (Abs): 0 10*3/uL (ref 0.0–0.1)
Immature Granulocytes: 0 %
Lymphocytes Absolute: 3 10*3/uL (ref 0.7–3.1)
Lymphs: 33 %
MCH: 27.3 pg (ref 26.6–33.0)
MCHC: 31.5 g/dL (ref 31.5–35.7)
MCV: 87 fL (ref 79–97)
Monocytes Absolute: 0.6 10*3/uL (ref 0.1–0.9)
Monocytes: 7 %
Neutrophils Absolute: 5.2 10*3/uL (ref 1.4–7.0)
Neutrophils: 57 %
RBC: 4.73 x10E6/uL (ref 3.77–5.28)
RDW: 14.6 % (ref 11.7–15.4)
WBC: 9 10*3/uL (ref 3.4–10.8)

## 2019-08-11 LAB — MICROALBUMIN / CREATININE URINE RATIO
Creatinine, Urine: 180.1 mg/dL
Microalb/Creat Ratio: 3 mg/g creat (ref 0–29)
Microalbumin, Urine: 5.4 ug/mL

## 2019-08-11 LAB — LIPID PANEL WITH LDL/HDL RATIO
Cholesterol, Total: 141 mg/dL (ref 100–199)
HDL: 42 mg/dL (ref >39)
LDL Calculated: 81 mg/dL (ref 0–99)
LDl/HDL Ratio: 1.9 ratio (ref 0.0–3.2)
Triglycerides: 92 mg/dL (ref 0–149)
VLDL Cholesterol Cal: 18 mg/dL (ref 5–40)

## 2019-08-11 LAB — INSULIN, RANDOM: INSULIN: 27 u[IU]/mL — ABNORMAL HIGH (ref 2.6–24.9)

## 2019-08-11 LAB — COMPREHENSIVE METABOLIC PANEL
ALT: 12 IU/L (ref 0–32)
AST: 13 IU/L (ref 0–40)
Albumin/Globulin Ratio: 1.2 (ref 1.2–2.2)
Albumin: 4.1 g/dL (ref 3.8–4.8)
Alkaline Phosphatase: 90 IU/L (ref 39–117)
BUN/Creatinine Ratio: 13 (ref 9–23)
BUN: 9 mg/dL (ref 6–20)
Bilirubin Total: 0.3 mg/dL (ref 0.0–1.2)
CO2: 21 mmol/L (ref 20–29)
Calcium: 9.4 mg/dL (ref 8.7–10.2)
Chloride: 101 mmol/L (ref 96–106)
Creatinine, Ser: 0.72 mg/dL (ref 0.57–1.00)
GFR calc Af Amer: 125 mL/min/{1.73_m2} (ref >59)
GFR calc non Af Amer: 108 mL/min/{1.73_m2} (ref >59)
Globulin, Total: 3.3 g/dL (ref 1.5–4.5)
Glucose: 66 mg/dL (ref 65–99)
Potassium: 4.4 mmol/L (ref 3.5–5.2)
Sodium: 139 mmol/L (ref 134–144)
Total Protein: 7.4 g/dL (ref 6.0–8.5)

## 2019-08-11 LAB — T3: T3, Total: 135 ng/dL (ref 71–180)

## 2019-08-11 LAB — HEMOGLOBIN A1C
Est. average glucose Bld gHb Est-mCnc: 117 mg/dL
Hgb A1c MFr Bld: 5.7 % — ABNORMAL HIGH (ref 4.8–5.6)

## 2019-08-11 LAB — T4, FREE: Free T4: 1.02 ng/dL (ref 0.82–1.77)

## 2019-08-11 LAB — TSH: TSH: 1.69 u[IU]/mL (ref 0.450–4.500)

## 2019-08-11 LAB — VITAMIN D 25 HYDROXY (VIT D DEFICIENCY, FRACTURES): Vit D, 25-Hydroxy: 27.6 ng/mL — ABNORMAL LOW (ref 30.0–100.0)

## 2019-08-17 ENCOUNTER — Other Ambulatory Visit: Payer: Self-pay

## 2019-08-17 ENCOUNTER — Ambulatory Visit (INDEPENDENT_AMBULATORY_CARE_PROVIDER_SITE_OTHER): Payer: Medicare Other | Admitting: Psychology

## 2019-08-17 DIAGNOSIS — F330 Major depressive disorder, recurrent, mild: Secondary | ICD-10-CM

## 2019-08-17 NOTE — Progress Notes (Signed)
Office: [phone]  /  Fax: [phone]   Dear Dr. [name],   Thank you for referring Melinda Ruiz to our clinic. The following note includes my evaluation and treatment recommendations.  HPI:   Chief Complaint: OBESITY    Melinda Ruiz has been referred by Greer Pickerel, MD for consultation regarding her obesity and obesity related comorbidities.    Melinda Ruiz (MR# [id number]) is a 38 y.o. female who presents on 08/10/2019 for obesity evaluation and treatment. Current BMI is Body mass index is 51.76 kg/m. Melinda Ruiz has been struggling with her weight for many years and has been unsuccessful in either losing weight, maintaining weight loss, or reaching her healthy weight goal.     [name] has a history of gastric sleeve in 2018. She started gradually putting weight on after approximately 6 months to 1 year. For breakfast, she is doing boiled sausage, 2 boiled eggs, or a bowl of Honey Nut Cherrios (2-3 cups) with 2 % milk (drink afterwards). About 1-2 hours later, she is doing [employer] chipotle steak and cheese wrap (1/2 wrap), or [employer] fried chicken. She snacks on snack size Smart Choice popcorn or a bowl of cereal 2-3 cups.     Melinda Ruiz attended our information session and states she is currently in the action stage of change and ready to dedicate time achieving and maintaining a healthier weight. Melinda Ruiz is interested in becoming our patient and working on intensive lifestyle modifications including (but not limited to) diet, exercise and weight loss.    Melinda Ruiz states her family eats meals together she thinks her family will eat healthier with  her her desired weight loss is 93 lbs she has been heavy most of  her life her heaviest weight ever was 305 lbs she is a picky eater and doesn't like to eat healthier foods  she has significant food cravings issues  she skips meals frequently she is frequently drinking liquids with calories she frequently makes poor food choices  she struggles with emotional eating    Melinda Ruiz feels her energy is lower than it should be. This has worsened with weight gain and has not worsened recently. Melinda Ruiz admits to daytime somnolence and  admits to waking up still tired. Patient has a history of obstructive sleep apnea without the use of CPAP. Patent has a history of symptoms of daytime Melinda and morning headache. Patient generally gets 6 hours of sleep per night, and states they generally have nightime awakenings. She is unsure if snoring is present. Apneic episodes are present. Epworth Sleepiness Score is 12.  Dyspnea on exertion Melinda Ruiz notes increasing shortness of breath with exercising and seems to be worsening over time with weight gain. She notes getting out of breath sooner with activity than she used to. This has not gotten worse recently. EKG-normal sinus rhythm at 86 BPM. Melinda Ruiz denies orthopnea.  Diabetes II with Hyperglycemia Melinda Ruiz has a diagnosis of diabetes type II. Melinda Ruiz's last eye exam was approximately 1-2 years ago. Her foot exam is scheduled at the end of this month. She denies hypoglycemia. Last Hgb A1c was 5.5. She has been working on intensive lifestyle modifications including diet, exercise, and weight loss to help control her blood glucose levels.  Obstructive Sleep Apnea Melinda Ruiz has a diagnosis of obstructive sleep apnea. She saw Melinda Lyons, MD at Pulmonology.   Vitamin D Deficiency Melinda Ruiz has a diagnosis of vitamin D deficiency. She is not currently taking Vit D and diagnosis is likely given obesity. She notes Melinda  and denies nausea, vomiting or muscle weakness.  Depression Screen Melinda Ruiz's Food and Mood (modified PHQ-9) score was  Depression screen PHQ 2/9 08/10/2019  Decreased Interest 3  Down, Depressed, Hopeless 3  PHQ - 2 Score 6  Altered sleeping 3  Tired, decreased energy 3  Change in appetite 3  Feeling bad or failure about yourself  3  Trouble concentrating 3  Moving  slowly or fidgety/restless 3  Suicidal thoughts 0  PHQ-9 Score 24  Difficult doing work/chores Not difficult at all    ASSESSMENT AND PLAN:  Other Melinda - Plan: EKG 12-Lead, CBC With Differential, T3, T4, free, TSH  Shortness of breath on exertion - Plan: Lipid Panel With LDL/HDL Ratio  Type 2 diabetes mellitus with hyperglycemia, without long-term current use of insulin (HCC) - Plan: Comprehensive metabolic panel, Hemoglobin A1c, Insulin, random  OSA (obstructive sleep apnea)  Vitamin D deficiency - Plan: VITAMIN D 25 Hydroxy (Vit-D Deficiency, Fractures)  Depression screening  Class 3 severe obesity with serious comorbidity and body mass index (BMI) of 50.0 to 59.9 in adult, unspecified obesity type (HCC)  PLAN:  Melinda [name] was informed that her Melinda may be related to obesity, depression or many other causes. Labs will be ordered, and in the meanwhile Melinda Ruiz has agreed to work on diet, exercise and weight loss to help with Melinda. Proper sleep hygiene was discussed including the need for 7-8 hours of quality sleep each night. A sleep study was not ordered based on symptoms and Epworth score.  Dyspnea on exertion [name]'s shortness of breath appears to be obesity related and exercise induced. She has agreed to work on weight loss and gradually increase exercise to treat her exercise induced shortness of breath. If Melinda Ruiz follows our instructions and loses weight without improvement of her shortness of breath, we will plan to refer to pulmonology. We will monitor this condition regularly. Melinda Ruiz agrees to this plan.  Diabetes II with Hyperglycemia Melinda Ruiz has been given extensive diabetes education by myself today including ideal fasting and post-prandial blood glucose readings, individual ideal Hgb A1c goals and hypoglycemia prevention. We discussed the importance of good blood sugar control to decrease the likelihood of diabetic complications such as nephropathy,  neuropathy, limb loss, blindness, coronary artery disease, and death. We discussed the importance of intensive lifestyle modification including diet, exercise and weight loss as the first line treatment for diabetes. We will check Hgb A1c and insulin level today. Romelia agrees to follow up with our clinic in 2 weeks.  Obstructive Sleep Apnea We will refer back to Pulmonology, as Keishla never got her CPAP. Daliyah agrees to follow up with our clinic in 2 weeks.  Vitamin D Deficiency Ellie was informed that low vitamin D levels contributes to Melinda and are associated with obesity, breast, and colon cancer. She will follow up for routine testing of vitamin D, at least 2-3 times per year. She was informed of the risk of over-replacement of vitamin D and agrees to not increase her dose unless she discusses this with [place] first. We will check Vit D level today. Terissa agrees to follow up with our clinic in 2 weeks.  Depression Screen Charitie had a strongly positive depression screening. Depression is commonly associated with obesity and often results in emotional eating behaviors. We will monitor this closely and work on CBT to help improve the non-hunger eating patterns. Referral to Psychology may be required if no improvement is seen as she continues in our clinic.  Obesity Kuuipo is  currently in the action stage of change and her goal is to continue with weight loss efforts. I recommend Deshea begin the structured treatment plan as follows:  She has agreed to follow the Category 3 plan Brean has been instructed to eventually work up to a goal of 150 minutes of combined cardio and strengthening exercise per week for weight loss and overall health benefits. We discussed the following Behavioral Modification Strategies today: increasing lean protein intake, increasing vegetables and work on meal planning and easy cooking plans, keeping healthy foods in the home, and planning for success   She  was informed of the importance of frequent follow up visits to maximize her success with intensive lifestyle modifications for her multiple health conditions. She was informed we would discuss her lab results at her next visit unless there is a critical issue that needs to be addressed sooner. Susana agreed to keep her next visit at the agreed upon time to discuss these results.  ALLERGIES: Allergies  Allergen Reactions  . Ibuprofen Itching  . Penicillins Hives    .Has patient had a PCN reaction causing immediate rash, facial/tongue/throat swelling, SOB or lightheadedness with hypotension: No Has patient had a PCN reaction causing severe rash involving mucus membranes or skin necrosis: No Has patient had a PCN reaction that required hospitalization No Has patient had a PCN reaction occurring within the last 10 years: No If all of the above answers are "NO", then may proceed with Cephalosporin use.  Marland Kitchen Oxycodone-Acetaminophen Itching    MEDICATIONS: Current Outpatient Medications on File Prior to Visit  Medication Sig Dispense Refill  . acetaminophen (TYLENOL) 500 MG tablet Take 1,000 mg by mouth every 6 (six) hours as needed for moderate pain.    Marland Kitchen diclofenac sodium (VOLTAREN) 1 % GEL Apply 2 g topically 4 (four) times daily. 150 g 0  . methocarbamol (ROBAXIN) 750 MG tablet Take 1 tablet (750 mg total) by mouth 3 (three) times daily as needed for muscle spasms. 15 tablet 0  . norethindrone (MICRONOR) 0.35 MG tablet Take 1 tablet (0.35 mg total) by mouth daily. 1 Package 11  . pantoprazole (PROTONIX) 40 MG tablet Take 1 tablet (40 mg total) by mouth daily. 90 tablet 0  . rizatriptan (MAXALT-MLT) 10 MG disintegrating tablet Take 1 tablet (10 mg total) by mouth as needed. May repeat in 2 hours if needed (Patient taking differently: Take 10 mg by mouth as needed for migraine. May repeat in 2 hours if needed) 15 tablet 6  . sertraline (ZOLOFT) 25 MG tablet Take 1 tablet (25 mg total) by mouth  daily. 60 tablet 0  . sucralfate (CARAFATE) 1 g tablet TAKE 1 TABLET BY MOUTH EVERY 6 HOURS AS NEEDED FOR ULCERS. Can crush and mix with water or applesauce if needed.     No current facility-administered medications on file prior to visit.     PAST MEDICAL HISTORY: Past Medical History:  Diagnosis Date  . Acid reflux   . Anemia    during pregancy  . Bipolar disorder ([place])   . Chiari I malformation ([place])   . Complication of anesthesia    "Oxygen level drops"  . Constipation   . Depression   . Diabetes mellitus without complication ([place])   . Family history of adverse reaction to anesthesia    thinks Mom has problem with oxygen level dropping  . Fibroid   . Gallbladder problem   . Headache(784.0)   . Hypertension    takes meds daily  .  Kidney stones 2010  . Lactose intolerance   . Mental disorder    Bi poloar,  Schizohrenia- not taking meds.  Has [facility]  . Morbid obesity with BMI of 50.0-59.9, adult ([place])   . Osteoarthritis   . Schizophrenia ([place])   . Sleep apnea   . Ulcer of abdomen wall ([place])     PAST SURGICAL HISTORY: Past Surgical History:  Procedure Laterality Date  . BREATH TEK H PYLORI N/A 05/31/2015   Procedure: BREATH TEK H PYLORI;  Surgeon: Greer Pickerel, MD;  Location: [facility];  Service: General;  Laterality: N/A;  . CARPAL TUNNEL RELEASE  02/03/2012   Procedure: CARPAL TUNNEL RELEASE;  Surgeon: Meredith Pel, MD;  Location: [place];  Service: Orthopedics;  Laterality: Left;  left carpal tunnel release  . CARPAL TUNNEL RELEASE  01/11/2013   Procedure: CARPAL TUNNEL RELEASE;  Surgeon: Meredith Pel, MD;  Location: [place];  Service: Orthopedics;  Laterality: Right;  Hand Set, Small Retractors, Lead Hand  . CESAREAN SECTION  2006, 2011  . CHOLECYSTECTOMY  06/2011  . cystectomy    . GANGLION CYST EXCISION  02/03/2012   Procedure: REMOVAL GANGLION OF WRIST;  Surgeon: Meredith Pel, MD;  Location: [place];  Service: Orthopedics;   Laterality: Left;  left dorsal ganglion cyst excision  . LAPAROSCOPIC GASTRIC SLEEVE RESECTION N/A 10/02/2015   Procedure: LAPAROSCOPIC GASTRIC SLEEVE RESECTION;  Surgeon: Greer Pickerel, MD;  Location: [facility];  Service: General;  Laterality: N/A;  . Lymp Glands Removed--under arm bil    . Lympectomy    . MULTIPLE TOOTH EXTRACTIONS  2003   and wisdom teeth  . Pilonidial Cyst Removed  1990's  . TUBAL LIGATION  2011  . UPPER GI ENDOSCOPY  10/02/2015   Procedure: UPPER GI ENDOSCOPY;  Surgeon: Greer Pickerel, MD;  Location: [facility];  Service: General;;    SOCIAL HISTORY: Social History   Tobacco Use  . Smoking status: Former Smoker    Packs/day: 0.25    Types: Cigarettes    Quit date: 10/01/2009    Years since quitting: 9.8  . Smokeless tobacco: Never Used  Substance Use Topics  . Alcohol use: Yes    Comment: has a drink twice weekly  . Drug use: No    FAMILY HISTORY: Family History  Problem Relation Age of Onset  . Kidney cancer Father   . Diabetes Mellitus II Father   . Hypertension Father   . Arthritis Mother   . Diabetes Mellitus II Mother   . Hypertension Mother   . Diabetes Mother   . Anxiety disorder Mother   . Anesthesia problems Neg Hx     ROS: Review of Systems  Constitutional: Positive for malaise/Melinda. Negative for weight loss.  HENT: Positive for tinnitus.   Eyes:       + Wear glasses or contacts  Respiratory: Positive for shortness of breath (with exertion).   Cardiovascular: Negative for orthopnea.  Gastrointestinal: Negative for nausea and vomiting.  Musculoskeletal: Positive for back pain.       Negative muscle weakness + Muscle or joint pain  Skin: Positive for itching.       + Dryness  Neurological: Positive for headaches.  Endo/Heme/Allergies:       Negative hypoglycemia  Psychiatric/Behavioral:       + Stress    PHYSICAL EXAM: Blood pressure 122/80, pulse 89, temperature 98.1 F (36.7 C), temperature source Oral, height 5\' 2"  (1.575 m),  weight 283 lb (  128.4 kg), last menstrual period 08/02/2019, SpO2 99 %. Body mass index is 51.76 kg/m. Physical Exam Vitals signs reviewed.  Constitutional:      Appearance: Normal appearance. She is obese.  HENT:     Head: Normocephalic and atraumatic.     Nose: Nose normal.  Eyes:     General: No scleral icterus.    Extraocular Movements: Extraocular movements intact.  Neck:     Musculoskeletal: Normal range of motion and neck supple.     Comments: No thyromegaly present Cardiovascular:     Rate and Rhythm: Normal rate and regular rhythm.     Pulses: Normal pulses.     Heart sounds: Normal heart sounds.  Pulmonary:     Effort: Pulmonary effort is normal.     Breath sounds: Normal breath sounds.  Abdominal:     Palpations: Abdomen is soft.     Tenderness: There is no abdominal tenderness.     Comments: + Obesity  Musculoskeletal: Normal range of motion.     Right lower leg: No edema.     Left lower leg: No edema.  Skin:    General: Skin is warm and dry.  Neurological:     Mental Status: She is alert and oriented to person, place, and time.     Coordination: Coordination normal.  Psychiatric:        Mood and Affect: Mood normal.        Behavior: Behavior normal.     RECENT LABS AND TESTS: BMET    Component Value Date/Time   NA 139 08/10/2019 1532   K 4.4 08/10/2019 1532   CL 101 08/10/2019 1532   CO2 21 08/10/2019 1532   GLUCOSE 66 08/10/2019 1532   GLUCOSE 64 (L) 10/02/2017 1753   BUN 9 08/10/2019 1532   CREATININE 0.72 08/10/2019 1532   CALCIUM 9.4 08/10/2019 1532   GFRNONAA 108 08/10/2019 1532   GFRAA 125 08/10/2019 1532   Lab Results  Component Value Date   HGBA1C 5.7 (H) 08/10/2019   Lab Results  Component Value Date   INSULIN 27.0 (H) 08/10/2019   CBC    Component Value Date/Time   WBC 9.0 08/10/2019 1532   WBC 9.3 10/02/2017 1753   RBC 4.73 08/10/2019 1532   RBC 4.54 10/02/2017 1753   HGB 12.9 08/10/2019 1532   HCT 41.0 08/10/2019 1532    PLT 336 08/17/2018 1343   MCV 87 08/10/2019 1532   MCH 27.3 08/10/2019 1532   MCH 28.6 10/02/2017 1753   MCHC 31.5 08/10/2019 1532   MCHC 33.2 10/02/2017 1753   RDW 14.6 08/10/2019 1532   LYMPHSABS 3.0 08/10/2019 1532   MONOABS 1.0 10/04/2015 0547   EOSABS 0.2 08/10/2019 1532   BASOSABS 0.1 08/10/2019 1532   Iron/TIBC/Ferritin/ %Sat No results found for: IRON, TIBC, FERRITIN, IRONPCTSAT Lipid Panel     Component Value Date/Time   CHOL 141 08/10/2019 1532   TRIG 92 08/10/2019 1532   HDL 42 08/10/2019 1532   CHOLHDL 3.0 08/17/2018 1343   LDLCALC 81 08/10/2019 1532   Hepatic Function Panel     Component Value Date/Time   PROT 7.4 08/10/2019 1532   ALBUMIN 4.1 08/10/2019 1532   AST 13 08/10/2019 1532   ALT 12 08/10/2019 1532   ALKPHOS 90 08/10/2019 1532   BILITOT 0.3 08/10/2019 1532      Component Value Date/Time   TSH 1.690 08/10/2019 1532    ECG  shows NSR with a rate of 86 BPM INDIRECT CALORIMETER  done today shows a VO2 of 290 and a REE of 2021.  Her calculated basal metabolic rate is 7858 thus her basal metabolic rate is better than expected.       OBESITY BEHAVIORAL INTERVENTION VISIT  Today's visit was # 1   Starting weight: 283 lbs Starting date: 08/10/2019 Today's weight : 283 lbs Today's date: 08/10/2019 Total lbs lost to date: 0 At least 15 minutes were spent on discussing the following behavioral intervention visit.   ASK: We discussed the diagnosis of obesity with [name] today and [place]onee agreed to give [place] permission to discuss obesity behavioral modification therapy today.  ASSESS: Danyeal has the diagnosis of obesity and her BMI today is 51.75 Analiya is in the action stage of change   ADVISE: Buena was educated on the multiple health risks of obesity as well as the benefit of weight loss to improve her health. She was advised of the need for long term treatment and the importance of lifestyle modifications to improve her current  health and to decrease her risk of future health problems.  AGREE: Multiple dietary modification options and treatment options were discussed and  Soniya agreed to follow the recommendations documented in the above note.  ARRANGE: Lillis was educated on the importance of frequent visits to treat obesity as outlined per CMS and USPSTF guidelines and agreed to schedule her next follow up appointment today.  I, Trixie Dredge, am acting as transcriptionist for Ilene Qua, MD  I have reviewed the above documentation for accuracy and completeness, and I agree with the above. - Ilene Qua, MD

## 2019-08-24 ENCOUNTER — Encounter (INDEPENDENT_AMBULATORY_CARE_PROVIDER_SITE_OTHER): Payer: Self-pay | Admitting: Family Medicine

## 2019-08-24 ENCOUNTER — Other Ambulatory Visit: Payer: Self-pay

## 2019-08-24 ENCOUNTER — Ambulatory Visit (INDEPENDENT_AMBULATORY_CARE_PROVIDER_SITE_OTHER): Payer: Medicare Other | Admitting: Family Medicine

## 2019-08-24 VITALS — BP 121/76 | HR 111 | Temp 98.4°F | Ht 62.0 in | Wt 283.0 lb

## 2019-08-24 DIAGNOSIS — E559 Vitamin D deficiency, unspecified: Secondary | ICD-10-CM

## 2019-08-24 DIAGNOSIS — Z6841 Body Mass Index (BMI) 40.0 and over, adult: Secondary | ICD-10-CM | POA: Diagnosis not present

## 2019-08-24 DIAGNOSIS — R7303 Prediabetes: Secondary | ICD-10-CM | POA: Diagnosis not present

## 2019-08-24 DIAGNOSIS — E66813 Obesity, class 3: Secondary | ICD-10-CM

## 2019-08-24 DIAGNOSIS — G4733 Obstructive sleep apnea (adult) (pediatric): Secondary | ICD-10-CM | POA: Diagnosis not present

## 2019-08-25 MED ORDER — VITAMIN D (ERGOCALCIFEROL) 1.25 MG (50000 UNIT) PO CAPS: 50000.0000 [IU] | ORAL_CAPSULE | ORAL | 0 refills | Status: DC

## 2019-08-25 NOTE — Progress Notes (Signed)
Office: [phone]  /  Fax: [phone]   HPI:   Chief Complaint: OBESITY Melinda Ruiz is here to discuss her progress with her obesity treatment plan. She is on the Category 3 plan and is following her eating plan approximately 75% of the time. She states she is walking 30 minutes 5 times per week. Shakerra reports a difficult 1st two weeks. She found the quantity of food to be difficult to get in. It often took her most of the day to get sandwich in and only getting 2 ounces of meat at dinner and getting 1/2 cup of vegetables. She did beef jerky bites, grapes and sliced apples for snacks. Her weight is 283 lb (128.4 kg) today and has not lost weight since her last visit. She has lost 0 lbs since starting treatment with [place].  Vitamin D deficiency Melinda Ruiz has a diagnosis of Vitamin D deficiency. She is currently taking prescription Vit D and denies nausea, vomiting or muscle weakness but does admit to fatigue.  Pre-Diabetes Melinda Ruiz has a diagnosis (not a new diagnosis) of prediabetes based on her elevated Hgb A1c and was informed this puts her at greater risk of developing diabetes. Last A1c 5.7 on 08/10/2019 with an insulin of 27.0. She is not taking metformin currently. Melinda Ruiz voices she does not have a grasp as to the development of diabetes mellitus.  Obstructive Sleep Apnea (OSA) Melinda Ruiz has a history of OSA but has had no follow-up for a CPAP. She does report having symptoms.  ASSESSMENT AND PLAN:  Vitamin D deficiency - Plan: Vitamin D, Ergocalciferol, (DRISDOL) 1.25 MG (50000 UT) CAPS capsule  Prediabetes  OSA (obstructive sleep apnea)  Class 3 severe obesity with serious comorbidity and body mass index (BMI) of 50.0 to 59.9 in adult, unspecified obesity type (Melinda Ruiz)  PLAN:  Vitamin D Deficiency Melinda Ruiz was informed that low Vitamin D levels contributes to fatigue and are associated with obesity, breast, and colon cancer. She agrees to continue to take prescription Vit D @  50,000 IU every week #4 with 0 refills and will follow-up for routine testing of Vitamin D, at least 2-3 times per year. She was informed of the risk of over-replacement of Vitamin D and agrees to not increase her dose unless she discusses this with [place] first. Melinda Ruiz agrees to follow-up with our clinic in 2 weeks.  Pre-Diabetes Melinda Ruiz will continue to work on weight loss, exercise, and decreasing simple carbohydrates in her diet to help decrease the risk of diabetes. We dicussed metformin including benefits and risks. She was informed that eating too many simple carbohydrates or too many calories at one sitting increases the likelihood of GI side effects. Melinda Ruiz will have follow-up labs in 3 months and will follow-up with [place] as directed to monitor her progress.  Obstructive Sleep Apnea (OSA) Melinda Ruiz is referred to Pulmonology for re-evaluation.  Obesity Melinda Ruiz is currently in the action stage of change. As such, her goal is to continue with weight loss efforts. She has agreed to follow the Category 3 plan. Melinda Ruiz has been instructed to work up to a goal of 150 minutes of combined cardio and strengthening exercise per week for weight loss and overall health benefits. We discussed the following Behavioral Modification Strategies today: increasing lean protein intake, increasing vegetables, work on meal planning and easy cooking plans, keeping healthy foods in the home, and planning for success.  Melinda Ruiz has agreed to follow-up with our clinic in 2 weeks. She was informed of the importance of frequent follow-up  visits to maximize her success with intensive lifestyle modifications for her multiple health conditions.  ALLERGIES: Allergies  Allergen Reactions  . Ibuprofen Itching  . Penicillins Hives    .Has patient had a PCN reaction causing immediate rash, facial/tongue/throat swelling, SOB or lightheadedness with hypotension: No Has patient had a PCN reaction causing severe rash involving  mucus membranes or skin necrosis: No Has patient had a PCN reaction that required hospitalization No Has patient had a PCN reaction occurring within the last 10 years: No If all of the above answers are "NO", then may proceed with Cephalosporin use.  Marland Kitchen Oxycodone-Acetaminophen Itching    MEDICATIONS: Current Outpatient Medications on File Prior to Visit  Medication Sig Dispense Refill  . acetaminophen (TYLENOL) 500 MG tablet Take 1,000 mg by mouth every 6 (six) hours as needed for moderate pain.    Marland Kitchen diclofenac sodium (VOLTAREN) 1 % GEL Apply 2 g topically 4 (four) times daily. 150 g 0  . methocarbamol (ROBAXIN) 750 MG tablet Take 1 tablet (750 mg total) by mouth 3 (three) times daily as needed for muscle spasms. 15 tablet 0  . norethindrone (MICRONOR) 0.35 MG tablet Take 1 tablet (0.35 mg total) by mouth daily. 1 Package 11  . pantoprazole (PROTONIX) 40 MG tablet Take 1 tablet (40 mg total) by mouth daily. 90 tablet 0  . rizatriptan (MAXALT-MLT) 10 MG disintegrating tablet Take 1 tablet (10 mg total) by mouth as needed. May repeat in 2 hours if needed (Patient taking differently: Take 10 mg by mouth as needed for migraine. May repeat in 2 hours if needed) 15 tablet 6  . sertraline (ZOLOFT) 25 MG tablet Take 1 tablet (25 mg total) by mouth daily. 60 tablet 0  . sucralfate (CARAFATE) 1 g tablet TAKE 1 TABLET BY MOUTH EVERY 6 HOURS AS NEEDED FOR ULCERS. Can crush and mix with water or applesauce if needed.     No current facility-administered medications on file prior to visit.     PAST MEDICAL HISTORY: Past Medical History:  Diagnosis Date  . Acid reflux   . Anemia    during pregancy  . Bipolar disorder ([place])   . 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 Complication of anesthesia    "Oxygen level drops"  . Constipation   . Depression   . Diabetes mellitus without complication ([place])   . Family history of adverse reaction to anesthesia    thinks Mom has problem with oxygen level dropping  .  Fibroid   . Gallbladder problem   . Headache(784.0)   . Hypertension    takes meds daily  . Kidney stones 2010  . Lactose intolerance   . Mental disorder    Bi poloar,  Schizohrenia- not taking meds.  Has been seen at [facility]  . Morbid obesity with BMI of 50.0-59.9, adult ([place])   . Osteoarthritis   . Schizophrenia ([place])   . Sleep apnea   . Ulcer of abdomen wall ([place])     PAST SURGICAL HISTORY: Past Surgical History:  Procedure Laterality Date  . BREATH TEK H PYLORI N/A 05/31/2015   Procedure: BREATH TEK H PYLORI;  Surgeon: Greer Pickerel, MD;  Location: [facility];  Service: General;  Laterality: N/A;  . CARPAL TUNNEL RELEASE  02/03/2012   Procedure: CARPAL TUNNEL RELEASE;  Surgeon: Meredith Pel, MD;  Location: [facility];  Service: Orthopedics;  Laterality: Left;  left carpal tunnel release  . CARPAL TUNNEL RELEASE  01/11/2013   Procedure: CARPAL TUNNEL  RELEASE;  Surgeon: Meredith Pel, MD;  Location: [facility];  Service: Orthopedics;  Laterality: Right;  Hand Set, Small Retractors, Lead Hand  . CESAREAN SECTION  2006, 2011  . CHOLECYSTECTOMY  06/2011  . cystectomy    . GANGLION CYST EXCISION  02/03/2012   Procedure: REMOVAL GANGLION OF WRIST;  Surgeon: Meredith Pel, MD;  Location: [facility];  Service: Orthopedics;  Laterality: Left;  left dorsal ganglion cyst excision  . LAPAROSCOPIC GASTRIC SLEEVE RESECTION N/A 10/02/2015   Procedure: LAPAROSCOPIC GASTRIC SLEEVE RESECTION;  Surgeon: Greer Pickerel, MD;  Location: [facility];  Service: General;  Laterality: N/A;  . Lymp Glands Removed--under arm bil    . Lympectomy    . MULTIPLE TOOTH EXTRACTIONS  2003   and wisdom teeth  . Pilonidial Cyst Removed  1990's  . TUBAL LIGATION  2011  . UPPER GI ENDOSCOPY  10/02/2015   Procedure: UPPER GI ENDOSCOPY;  Surgeon: Greer Pickerel, MD;  Location: [facility];  Service: General;;    SOCIAL HISTORY: Social History   Tobacco Use  . Smoking status: Former Smoker    Packs/day: 0.25    Types:  Cigarettes    Quit date: 10/01/2009    Years since quitting: 9.9  . Smokeless tobacco: Never Used  Substance Use Topics  . Alcohol use: Yes    Comment: has a drink twice weekly  . Drug use: No    FAMILY HISTORY: Family History  Problem Relation Age of Onset  . Kidney cancer Father   . Diabetes Mellitus II Father   . Hypertension Father   . Arthritis Mother   . Diabetes Mellitus II Mother   . Hypertension Mother   . Diabetes Mother   . Anxiety disorder Mother   . Anesthesia problems Neg Hx    ROS: Review of Systems  Constitutional: Positive for malaise/fatigue.  Respiratory:       Positive for OSA.  Gastrointestinal: Negative for nausea and vomiting.  Musculoskeletal:       Negative for muscle weakness.   PHYSICAL EXAM: Blood pressure 121/76, pulse (!) 111, temperature 98.4 F (36.9 C), temperature source Oral, height 5\' 2"  (1.575 m), weight 283 lb (128.4 kg), last menstrual period 08/02/2019, SpO2 96 %. Body mass index is 51.76 kg/m. Physical Exam Vitals signs reviewed.  Constitutional:      Appearance: Normal appearance. She is obese.  Cardiovascular:     Rate and Rhythm: Normal rate.     Pulses: Normal pulses.  Pulmonary:     Effort: Pulmonary effort is normal.     Breath sounds: Normal breath sounds.  Musculoskeletal: Normal range of motion.  Skin:    General: Skin is warm and dry.  Neurological:     Mental Status: She is alert and oriented to person, place, and time.  Psychiatric:        Behavior: Behavior normal.   RECENT LABS AND TESTS: BMET    Component Value Date/Time   NA 139 08/10/2019 1532   K 4.4 08/10/2019 1532   CL 101 08/10/2019 1532   CO2 21 08/10/2019 1532   GLUCOSE 66 08/10/2019 1532   GLUCOSE 64 (L) 10/02/2017 1753   BUN 9 08/10/2019 1532   CREATININE 0.72 08/10/2019 1532   CALCIUM 9.4 08/10/2019 1532   GFRNONAA 108 08/10/2019 1532   GFRAA 125 08/10/2019 1532   Lab Results  Component Value Date   HGBA1C 5.7 (H) 08/10/2019    HGBA1C 5.5 08/17/2018   HGBA1C 5.6 04/22/2018   Lab  Results  Component Value Date   INSULIN 27.0 (H) 08/10/2019   CBC    Component Value Date/Time   WBC 9.0 08/10/2019 1532   WBC 9.3 10/02/2017 1753   RBC 4.73 08/10/2019 1532   RBC 4.54 10/02/2017 1753   HGB 12.9 08/10/2019 1532   HCT 41.0 08/10/2019 1532   PLT 336 08/17/2018 1343   MCV 87 08/10/2019 1532   MCH 27.3 08/10/2019 1532   MCH 28.6 10/02/2017 1753   MCHC 31.5 08/10/2019 1532   MCHC 33.2 10/02/2017 1753   RDW 14.6 08/10/2019 1532   LYMPHSABS 3.0 08/10/2019 1532   MONOABS 1.0 10/04/2015 0547   EOSABS 0.2 08/10/2019 1532   BASOSABS 0.1 08/10/2019 1532   Iron/TIBC/Ferritin/ %Sat No results found for: IRON, TIBC, FERRITIN, IRONPCTSAT Lipid Panel     Component Value Date/Time   CHOL 141 08/10/2019 1532   TRIG 92 08/10/2019 1532   HDL 42 08/10/2019 1532   CHOLHDL 3.0 08/17/2018 1343   LDLCALC 81 08/10/2019 1532   Hepatic Function Panel     Component Value Date/Time   PROT 7.4 08/10/2019 1532   ALBUMIN 4.1 08/10/2019 1532   AST 13 08/10/2019 1532   ALT 12 08/10/2019 1532   ALKPHOS 90 08/10/2019 1532   BILITOT 0.3 08/10/2019 1532      Component Value Date/Time   TSH 1.690 08/10/2019 1532   Results for KRISTEN, FRANGELLA (MRN [id number]) as of 08/25/2019 08:40  Ref. Range 08/10/2019 15:32  Vitamin D, 25-Hydroxy Latest Ref Range: 30.0 - 100.0 ng/mL 27.6 (L)   OBESITY BEHAVIORAL INTERVENTION VISIT  Today's visit was #2  Starting weight: 283 lbs Starting date: 08/10/2019 Today's weight: 283 lbs  Today's date: 08/24/2019 Total lbs lost to date: 0 At least 15 minutes were spent on discussing the following behavioral intervention visit.    08/24/2019  Height 5\' 2"  (1.575 m)  Weight 283 lb (128.4 kg)  BMI (Calculated) 51.75  BLOOD PRESSURE - SYSTOLIC [id number]  BLOOD PRESSURE - DIASTOLIC 76   Body Fat % [id number] %  Total Body Water (lbs) 105.4 lbs   ASK: We discussed the diagnosis of obesity with Melinda Ruiz today and Melinda Ruiz agreed to give [place] permission to discuss obesity behavioral modification therapy today.  ASSESS: Melinda Ruiz has the diagnosis of obesity and her BMI today is 51.8. Melinda Ruiz is in the action stage of change.   ADVISE: Melinda Ruiz was educated on the multiple health risks of obesity as well as the benefit of weight loss to improve her health. She was advised of the need for long term treatment and the importance of lifestyle modifications to improve her current health and to decrease her risk of future health problems.  AGREE: Multiple dietary modification options and treatment options were discussed and  Melinda Ruiz agreed to follow the recommendations documented in the above note.  ARRANGE: Melinda Ruiz was educated on the importance of frequent visits to treat obesity as outlined per CMS and USPSTF guidelines and agreed to schedule her next follow up appointment today.  I, Melinda Ruiz, am acting as transcriptionist for Ilene Qua, MD  I have reviewed the above documentation for accuracy and completeness, and I agree with the above. - Ilene Qua, MD

## 2019-08-31 NOTE — Progress Notes (Signed)
Office: [phone]  /  Fax: [phone]    Date: September 06, 2019   Appointment Start Time: 8:34am Duration: 24 minutes Provider: Glennie Isle, Psy.D. Type of Session: Individual Therapy  Location of Patient: Home Location of Provider: Provider's Home Type of Contact: Telepsychological Visit via [employer] WebEx   Session Content: Melinda Ruiz is a 38 y.o. female presenting via [place] for a follow-up appointment to address the previously established treatment goal of decreasing emotional eating. Of note, this provider called Melinda Ruiz at 8:32am as she did not present for the [facility] appointment. It appears Melinda Ruiz forgot about today's appointment, but indicated she would be able to sign on. The e-mail with the secure link was re-sent. As such, today's appointment was initiated 4 minutes late. Today's appointment was a telepsychological visit, as this provider's clinic is seeing a limited number of patients for in-person visits due to COVID-19. Therapeutic services will resume to in-person appointments once deemed appropriate. Melinda Ruiz expressed understanding regarding the rationale for telepsychological services, and provided verbal consent for today's appointment. Prior to proceeding with today's appointment, Melinda Ruiz's physical location at the time of this appointment was obtained. Melinda Ruiz reported she was at home and provided the address. In the event of technical difficulties, Melinda Ruiz shared a phone number she could be reached at. Melinda Ruiz and this provider participated in today's telepsychological service. Also, Melinda Ruiz denied anyone else being present in the room or on the WebEx appointment.  This provider conducted a brief check-in and verbally administered the PHQ-9 and GAD-7. Melinda Ruiz shared, "I'm still trying to get myself together." This was explored. She explained she was referring to her relationship with her boyfriend and she added, "I don't want to mess things up with him." Based on what  Melinda Ruiz shared regarding worry due to her cousin being murdered during the last appointment, this provider checked-in. She denied current safety concerns and did not endorse any new developments. Regarding eating, Melinda Ruiz acknowledged she is experiencing a "hard time saying no" to food that is not on her meal plan. This was explored further and Melinda Ruiz acknowledged she is able to consume breakfast regularly, but at times does not have access to foods on her meal plan. Due to difficulty with eating congruent to the meal plan, this provider recommended focusing on protein intake and discussed the importance of protein intake. Regarding physical versus emotional hunger, Melinda Ruiz acknowledged she did not utilize the handout; therefore, it was reviewed. Psychoeducation regarding triggers for emotional eating was also provided. Melinda Ruiz was provided a handout, and encouraged to utilize the handout between now and the next appointment to increase awareness of triggers and frequency. Melinda Ruiz agreed. This provider also discussed behavioral strategies for specific triggers, such as placing the utensil down when conversing to avoid mindless eating. [place] provided verbal consent during today's appointment for this provider to send the handout for triggers via e-mail. Melinda Ruiz was receptive to today's session as evidenced by openness to sharing, responsiveness to feedback, and willingness to explore triggers for emotional eating.   Mental Status Examination:  Appearance: neat Behavior: cooperative Mood: euthymic Affect: mood congruent Speech: normal in rate, volume, and tone Eye Contact: appropriate Psychomotor Activity: appropriate Thought Process: linear, logical, and goal directed  Content/Perceptual Disturbances: no hallucinations, delusions, bizarre thinking or behavior reported or observed and no evidence of suicidal and homicidal ideation, plan, and intent Orientation: time, person, place and purpose of  appointment Cognition/Sensorium: memory, attention, language, and fund of knowledge intact  Insight: fair Judgment: fair  Structured Assessment Results: The  Patient Health Questionnaire-9 (PHQ-9) is a self-report measure that assesses symptoms and severity of depression over the course of the last two weeks. Melinda Ruiz obtained a score of 14 suggesting moderate depression. Melinda Ruiz finds the endorsed symptoms to be somewhat difficult. Melinda Ruiz interest or pleasure in doing things 1  Feeling down, depressed, or hopeless 0  Trouble falling or staying asleep, or sleeping too much 3  Feeling tired or having Melinda Ruiz energy 3  Poor appetite or overeating 3  Feeling bad about yourself --- or that you are a failure or have let yourself or your family down 3  Trouble concentrating on things, such as reading the newspaper or watching television 1  Moving or speaking so slowly that other people could have noticed? Or the opposite --- being so fidgety or restless that you have been moving around a lot more than usual 0  Thoughts that you would be better off dead or hurting yourself in some way 0  PHQ-9 Score 14    The Generalized Anxiety Disorder-7 (GAD-7) is a brief self-report measure that assesses symptoms of anxiety over the course of the last two weeks. Melinda Ruiz obtained a score of 12 suggesting moderate anxiety. Melinda Ruiz finds the endorsed symptoms to be not difficult at all. Feeling nervous, anxious, on edge 1  Not being able to stop or control worrying 3  Worrying too much about different things 3  Trouble relaxing 1  Being so restless that it's hard to sit still 0  Becoming easily annoyed or irritable 3  Feeling afraid as if something awful might happen 1  GAD-7 Score 12   Interventions:  Conducted a brief chart review Verbal administration of PHQ-9 and GAD-7 for symptom monitoring Provided empathic reflections and validation Reviewed content from the previous session Psychoeducation provided  regarding triggers for emotional eating Focused on rapport building Employed supportive psychotherapy interventions to facilitate reduced distress, and to improve coping skills with identified stressors Psychoeducation provided regarding protein intake  DSM-5 Diagnosis: 296.31 (F33.0) Major Depressive Disorder, Recurrent Episode, Mild, With Anxious Distress  Treatment Goal & Progress: During the initial appointment with this provider, the following treatment goal was established: decrease emotional eating. Progress is limited, as Melinda Ruiz has just begun treatment with this provider; however, she is receptive to the interaction and interventions and rapport is being established.   Plan: Melinda Ruiz continues to appear able and willing to participate as evidenced by engagement in reciprocal conversation, and asking questions for clarification as appropriate. The next appointment will be scheduled in two weeks, which will be via [employer]. The next session will focus on the introduction of pleasurable activities.

## 2019-09-02 ENCOUNTER — Other Ambulatory Visit: Payer: Self-pay

## 2019-09-02 ENCOUNTER — Encounter: Payer: Self-pay | Admitting: Student in an Organized Health Care Education/Training Program

## 2019-09-02 ENCOUNTER — Ambulatory Visit (INDEPENDENT_AMBULATORY_CARE_PROVIDER_SITE_OTHER): Payer: Medicare Other | Admitting: Student in an Organized Health Care Education/Training Program

## 2019-09-02 VITALS — BP 130/80 | HR 84 | Wt 286.2 lb

## 2019-09-02 DIAGNOSIS — G5602 Carpal tunnel syndrome, left upper limb: Secondary | ICD-10-CM

## 2019-09-02 NOTE — Patient Instructions (Signed)
It was a pleasure to see you today!  To summarize our discussion for this visit:  Your carpel tunnel syndrome appears to be getting better with conservative measures. Please continue to wear your brace at night and alter work habits to help prevent symptoms.   I will follow up to see what happened with the occupational therapy referral.  Per your request, we will also move forward with other measures to help your carpel tunnel syndrome  I will refer to neurology for nerve conduction studies  And refer to sports medicine clinic for steroid injection.   Some additional health maintenance measures we should update are: Health Maintenance Due  Topic Date Due  . PNEUMOCOCCAL POLYSACCHARIDE VACCINE AGE 43-64 HIGH RISK  11/04/1984  . FOOT EXAM  11/04/1992  . OPHTHALMOLOGY EXAM  11/04/1992  . TETANUS/TDAP  11/04/2001  . INFLUENZA VACCINE  07/30/2019  .  I would recommend you get your vaccines at your next convenience. You can drop off your paperwork at the front desk to be filled out within the next week.  Please return to our clinic to see me in about 2 months to check on resolution of symptoms.  Call the clinic at [phone] if your symptoms worsen or you have any concerns.   Thank you for allowing me to take part in your care,  Dr. Doristine Mango

## 2019-09-02 NOTE — Progress Notes (Signed)
   Subjective:    Patient ID: Melinda Ruiz, female    DOB: [birth year], 38 y.o.   MRN: [id number]   CC: Carpel tunnel syndrome f/u  HPI:  Patient here for follow-up of carpal tunnel syndrome.  Patient states that since her last appointment she has been wearing her wrist splint during the day, especially when working.  Her symptoms have improved in both severity and frequency.  She states that she has had just mild tingling in her left hand after a shift at work.  She has not had decreased strength in her grip.  The numbness and tingling continues to be positioned in her left first and second digit primarily.  Patient continues to deny symptoms of the right hand.  Patient has not begun occupational therapy due to some discrepancy in the referral.  Patient states she has not heard anything about the referral to this point.  She is still interested in doing occupational therapy.  At this time, patient is interested in pursuing more aggressive therapy for her condition.  She is very interested in doing nerve conduction studies and steroid injections.  Smoking status reviewed   ROS: pertinent noted in the HPI  Past medical history, surgical, family, and social history reviewed and updated in the EMR as appropriate.  Objective:  BP 130/80   Pulse 84   Wt 286 lb 3.2 oz (129.8 kg)   LMP 09/02/2019 (Exact Date)   SpO2 97%   BMI 52.35 kg/m   Vitals and nursing note reviewed  General: NAD, pleasant, able to participate in exam Extremities: no edema or cyanosis. Skin: warm and dry, no rashes noted Neuro: alert, no obvious focal deficits Psych: Normal affect and mood Hand:  Observation: Left and right hand are symmetrical.  No thenar eminence atrophy is present. Palpation: There is no tenderness to palpation over the carpal tunnel. Strength: Patient has symmetrical and full strength in hands testing the ulnar, radial nerve.  Grip strength, pincer strength symmetrical and full bilaterally.  Sensation: Patient had no abnormal sensation to touch in hands. Tests: Patient had negative Phalen's, Tinel's, Finkelstein's test today.   Assessment & Plan:    Carpal tunnel syndrome of left wrist Continue to splint the wrist at night. Refer to sports medicine for carpal tunnel injections. Referred to neurology for nerve conduction studies per patient request. Follow-up with referral coordinator for occupational therapy referral that was placed at last visit. Follow-up in about 6 weeks to monitor occupational therapy and steroid injection response    Doristine Mango, [place] PGY-2

## 2019-09-02 NOTE — Assessment & Plan Note (Signed)
Continue to splint the wrist at night. Refer to sports medicine for carpal tunnel injections. Referred to neurology for nerve conduction studies per patient request. Follow-up with referral coordinator for occupational therapy referral that was placed at last visit. Follow-up in about 6 weeks to monitor occupational therapy and steroid injection response

## 2019-09-04 NOTE — Assessment & Plan Note (Signed)
-  Recommended patient use splint at night -Referring patient to occupational therapy to assist in work related dysfunction. -Prescribed topical NSAID gel as patient is intolerant to oral ibuprofen -Follow-up if not improving with conservative measures

## 2019-09-06 ENCOUNTER — Ambulatory Visit (INDEPENDENT_AMBULATORY_CARE_PROVIDER_SITE_OTHER): Payer: Medicare Other | Admitting: Psychology

## 2019-09-06 ENCOUNTER — Other Ambulatory Visit: Payer: Self-pay

## 2019-09-06 DIAGNOSIS — F330 Major depressive disorder, recurrent, mild: Secondary | ICD-10-CM

## 2019-09-08 ENCOUNTER — Other Ambulatory Visit: Payer: Self-pay

## 2019-09-08 ENCOUNTER — Ambulatory Visit (INDEPENDENT_AMBULATORY_CARE_PROVIDER_SITE_OTHER): Payer: Medicare Other | Admitting: Family Medicine

## 2019-09-08 DIAGNOSIS — E559 Vitamin D deficiency, unspecified: Secondary | ICD-10-CM | POA: Diagnosis not present

## 2019-09-08 DIAGNOSIS — G4733 Obstructive sleep apnea (adult) (pediatric): Secondary | ICD-10-CM | POA: Diagnosis not present

## 2019-09-08 DIAGNOSIS — Z6841 Body Mass Index (BMI) 40.0 and over, adult: Secondary | ICD-10-CM | POA: Diagnosis not present

## 2019-09-08 MED ORDER — VITAMIN D (ERGOCALCIFEROL) 1.25 MG (50000 UNIT) PO CAPS: 50000.0000 [IU] | ORAL_CAPSULE | ORAL | 0 refills | Status: DC

## 2019-09-12 NOTE — Progress Notes (Signed)
Office: [phone]  /  Fax: [phone] TeleHealth Visit:  Melinda Ruiz has verbally consented to this TeleHealth visit today. The patient is located at home, the provider is located at the [employer] [facility] office. The participants in this visit include the listed provider and patient. The visit was conducted today via webex.  HPI:   Chief Complaint: OBESITY Melinda Ruiz is here to discuss her progress with her obesity treatment plan. She is on the Category 3 plan and is following her eating plan approximately 60 % of the time. She states she is walking for 30 minutes 5 times per week. [name] is still struggling with skipping breakfast (at least 3 days a week). She does find this leads to increased hunger at lunch. She is getting in most of the lunch and dinner. She is doing 2 baked chicken tenders and 3 or 4 bites of vegetables (not weighing or measuring) she is eating 1 cup of rice, 1 cup of vegetables and baked chicken legs at dinner. She id doing yogurt, smart food popcorn, and peanuts for snacks.  We were unable to weigh the patient today for this TeleHealth visit. She feels as if she has maintained her weight since her last visit. She has lost 0 lbs since starting treatment with [place].  Vitamin D Deficiency Melinda Ruiz has a diagnosis of vitamin D deficiency. She is currently taking prescription Vit D. She notes fatigue and denies nausea, vomiting or muscle weakness.  Obstructive Sleep Apnea Melinda Ruiz states she hasn't heard from Neurology, awaiting likely repeat of sleep study.  ASSESSMENT AND PLAN:  Vitamin D deficiency - Plan: Vitamin D, Ergocalciferol, (DRISDOL) 1.25 MG (50000 UT) CAPS capsule  OSA (obstructive sleep apnea) - Plan: Ambulatory referral to Sleep Studies  Class 3 severe obesity with serious comorbidity and body mass index (BMI) of 50.0 to 59.9 in adult, unspecified obesity type ([place])  PLAN:  Vitamin D Deficiency Melinda Ruiz was informed that low vitamin D  levels contributes to fatigue and are associated with obesity, breast, and colon cancer. Melinda Ruiz agrees to continue taking prescription Vit D 50,000 IU every week #4 and we will refill for 1 month. She will follow up for routine testing of vitamin D, at least 2-3 times per year. She was informed of the risk of over-replacement of vitamin D and agrees to not increase her dose unless she discusses this with [place] first. Melinda Ruiz agrees to follow up with our clinic in 2 weeks.  Obstructive Sleep Apnea Melinda Ruiz is to follow up on her Neurology referral, and she agrees to follow up with our clinic in 2 weeks.  Obesity Melinda Ruiz is currently in the action stage of change. As such, her goal is to continue with weight loss efforts She has agreed to keep a food journal with 300-400 calories and 25+ grams of protein at breakfast daily and follow the Category 2 plan Melinda Ruiz has been instructed to work up to a goal of 150 minutes of combined cardio and strengthening exercise per week for weight loss and overall health benefits. We discussed the following Behavioral Modification Strategies today: increasing lean protein intake, increasing vegetables and work on meal planning and easy cooking plans, keeping healthy foods in the home, and planning for success   Melinda Ruiz has agreed to follow up with our clinic in 2 weeks. She was informed of the importance of frequent follow up visits to maximize her success with intensive lifestyle modifications for her multiple health conditions.  ALLERGIES: Allergies  Allergen Reactions  .  Ibuprofen Itching  . Penicillins Hives    .Has patient had a PCN reaction causing immediate rash, facial/tongue/throat swelling, SOB or lightheadedness with hypotension: No Has patient had a PCN reaction causing severe rash involving mucus membranes or skin necrosis: No Has patient had a PCN reaction that required hospitalization No Has patient had a PCN reaction occurring within the last 10  years: No If all of the above answers are "NO", then may proceed with Cephalosporin use.  Marland Kitchen Oxycodone-Acetaminophen Itching    MEDICATIONS: Current Outpatient Medications on File Prior to Visit  Medication Sig Dispense Refill  . acetaminophen (TYLENOL) 500 MG tablet Take 1,000 mg by mouth every 6 (six) hours as needed for moderate pain.    Marland Kitchen diclofenac sodium (VOLTAREN) 1 % GEL Apply 2 g topically 4 (four) times daily. 150 g 0  . norethindrone (MICRONOR) 0.35 MG tablet Take 1 tablet (0.35 mg total) by mouth daily. 1 Package 11  . pantoprazole (PROTONIX) 40 MG tablet Take 1 tablet (40 mg total) by mouth daily. 90 tablet 0  . sertraline (ZOLOFT) 25 MG tablet Take 1 tablet (25 mg total) by mouth daily. 60 tablet 0  . sucralfate (CARAFATE) 1 g tablet TAKE 1 TABLET BY MOUTH EVERY 6 HOURS AS NEEDED FOR ULCERS. Can crush and mix with water or applesauce if needed.     No current facility-administered medications on file prior to visit.     PAST MEDICAL HISTORY: Past Medical History:  Diagnosis Date  . Acid reflux   . Anemia    during pregancy  . Bipolar disorder ([place])   . Chiari I malformation ([place])   . Complication of anesthesia    "Oxygen level drops"  . Constipation   . Depression   . Diabetes mellitus without complication ([place])   . Family history of adverse reaction to anesthesia    thinks Mom has problem with oxygen level dropping  . Fibroid   . Gallbladder problem   . Headache(784.0)   . Hypertension    takes meds daily  . Kidney stones 2010  . Lactose intolerance   . Mental disorder    Bi poloar,  Schizohrenia- not taking meds.  Has been seen at [facility]  . Morbid obesity with BMI of 50.0-59.9, adult ([place])   . Osteoarthritis   . Schizophrenia ([place])   . Sleep apnea   . Ulcer of abdomen wall ([place])     PAST SURGICAL HISTORY: Past Surgical History:  Procedure Laterality Date  . BREATH TEK H PYLORI N/A 05/31/2015   Procedure: BREATH TEK H PYLORI;  Surgeon: Greer Pickerel, MD;  Location: [facility];  Service: General;  Laterality: N/A;  . CARPAL TUNNEL RELEASE  02/03/2012   Procedure: CARPAL TUNNEL RELEASE;  Surgeon: Meredith Pel, MD;  Location: [facility];  Service: Orthopedics;  Laterality: Left;  left carpal tunnel release  . CARPAL TUNNEL RELEASE  01/11/2013   Procedure: CARPAL TUNNEL RELEASE;  Surgeon: Meredith Pel, MD;  Location: [facility];  Service: Orthopedics;  Laterality: Right;  Hand Set, Small Retractors, Lead Hand  . CESAREAN SECTION  2006, 2011  . CHOLECYSTECTOMY  06/2011  . cystectomy    . GANGLION CYST EXCISION  02/03/2012   Procedure: REMOVAL GANGLION OF WRIST;  Surgeon: Meredith Pel, MD;  Location: [facility];  Service: Orthopedics;  Laterality: Left;  left dorsal ganglion cyst excision  . LAPAROSCOPIC GASTRIC SLEEVE RESECTION N/A 10/02/2015   Procedure: LAPAROSCOPIC GASTRIC SLEEVE RESECTION;  Surgeon: Greer Pickerel,  MD;  Location: [facility];  Service: General;  Laterality: N/A;  . Lymp Glands Removed--under arm bil    . Lympectomy    . MULTIPLE TOOTH EXTRACTIONS  2003   and wisdom teeth  . Pilonidial Cyst Removed  1990's  . TUBAL LIGATION  2011  . UPPER GI ENDOSCOPY  10/02/2015   Procedure: UPPER GI ENDOSCOPY;  Surgeon: Greer Pickerel, MD;  Location: [facility];  Service: General;;    SOCIAL HISTORY: Social History   Tobacco Use  . Smoking status: Former Smoker    Packs/day: 0.25    Types: Cigarettes    Quit date: 10/01/2009    Years since quitting: 9.9  . Smokeless tobacco: Never Used  Substance Use Topics  . Alcohol use: Yes    Comment: has a drink twice weekly  . Drug use: No    FAMILY HISTORY: Family History  Problem Relation Age of Onset  . Kidney cancer Father   . Diabetes Mellitus II Father   . Hypertension Father   . Arthritis Mother   . Diabetes Mellitus II Mother   . Hypertension Mother   . Diabetes Mother   . Anxiety disorder Mother   . Anesthesia problems Neg Hx     ROS: Review of Systems  Constitutional:  Positive for malaise/fatigue. Negative for weight loss.  Gastrointestinal: Negative for nausea and vomiting.  Musculoskeletal:       Negative muscle weakness    PHYSICAL EXAM: Pt in no acute distress  RECENT LABS AND TESTS: BMET    Component Value Date/Time   NA 139 08/10/2019 1532   K 4.4 08/10/2019 1532   CL 101 08/10/2019 1532   CO2 21 08/10/2019 1532   GLUCOSE 66 08/10/2019 1532   GLUCOSE 64 (L) 10/02/2017 1753   BUN 9 08/10/2019 1532   CREATININE 0.72 08/10/2019 1532   CALCIUM 9.4 08/10/2019 1532   GFRNONAA 108 08/10/2019 1532   GFRAA 125 08/10/2019 1532   Lab Results  Component Value Date   HGBA1C 5.7 (H) 08/10/2019   HGBA1C 5.5 08/17/2018   HGBA1C 5.6 04/22/2018   Lab Results  Component Value Date   INSULIN 27.0 (H) 08/10/2019   CBC    Component Value Date/Time   WBC 9.0 08/10/2019 1532   WBC 9.3 10/02/2017 1753   RBC 4.73 08/10/2019 1532   RBC 4.54 10/02/2017 1753   HGB 12.9 08/10/2019 1532   HCT 41.0 08/10/2019 1532   PLT 336 08/17/2018 1343   MCV 87 08/10/2019 1532   MCH 27.3 08/10/2019 1532   MCH 28.6 10/02/2017 1753   MCHC 31.5 08/10/2019 1532   MCHC 33.2 10/02/2017 1753   RDW 14.6 08/10/2019 1532   LYMPHSABS 3.0 08/10/2019 1532   MONOABS 1.0 10/04/2015 0547   EOSABS 0.2 08/10/2019 1532   BASOSABS 0.1 08/10/2019 1532   Iron/TIBC/Ferritin/ %Sat No results found for: IRON, TIBC, FERRITIN, IRONPCTSAT Lipid Panel     Component Value Date/Time   CHOL 141 08/10/2019 1532   TRIG 92 08/10/2019 1532   HDL 42 08/10/2019 1532   CHOLHDL 3.0 08/17/2018 1343   LDLCALC 81 08/10/2019 1532   Hepatic Function Panel     Component Value Date/Time   PROT 7.4 08/10/2019 1532   ALBUMIN 4.1 08/10/2019 1532   AST 13 08/10/2019 1532   ALT 12 08/10/2019 1532   ALKPHOS 90 08/10/2019 1532   BILITOT 0.3 08/10/2019 1532      Component Value Date/Time   TSH 1.690 08/10/2019 1532  I, Trixie Dredge, am acting as transcriptionist for Ilene Qua, MD  I have reviewed the above documentation for accuracy and completeness, and I agree with the above. - Ilene Qua, MD

## 2019-09-12 NOTE — Progress Notes (Unsigned)
Office: [phone]  /  Fax: [phone]    Date: September 20, 2019   Appointment Start Time:*** Duration:*** Provider: Glennie Isle, Psy.D. Type of Session: Individual Therapy  Location of Patient: *** Location of Provider: Provider's Home Type of Contact: Telepsychological Visit via [employer] WebEx   Session Content: Melinda Ruiz is a 38 y.o. female presenting via [place] for a follow-up appointment to address the previously established treatment goal of decreasing emotional eating.  Today's appointment was a telepsychological visit, as this provider's clinic is seeing a limited number of patients for in-person visits due to COVID-19. Therapeutic services will resume to in-person appointments once deemed appropriate. Melinda Ruiz expressed understanding regarding the rationale for telepsychological services, and provided verbal consent for today's appointment. Prior to proceeding with today's appointment, Melinda Ruiz's physical location at the time of this appointment was obtained. Melinda Ruiz reported she was at *** and provided the address. In the event of technical difficulties, Melinda Ruiz shared a phone number she could be reached at. Melinda Ruiz and this provider participated in today's telepsychological service. Also, Melinda Ruiz denied anyone else being present in the room Melinda on the WebEx appointment ***.  This provider conducted a brief check-in and verbally administered the PHQ-9 and GAD-7. *** Melinda Ruiz was receptive to today's session as evidenced by openness to sharing, responsiveness to feedback, and ***.  Mental Status Examination:  Appearance: {Appearance:22431} Behavior: {Behavior:22445} Mood: {Teletherapy mood:22435} Affect: {Affect:22436} Speech: {Speech:22432} Eye Contact: {Eye Contact:22433} Psychomotor Activity: {Motor Activity:22434} Thought Process: {thought process:22448}  Content/Perceptual Disturbances: {disturbances:22451} Orientation: {Orientation:22437} Cognition/Sensorium:  {gbcognition:22449} Insight: {Insight:22446} Judgment: {Insight:22446}  Structured Assessment Results: The Patient Health Questionnaire-9 (PHQ-9) is a self-report measure that assesses symptoms and severity of depression over the course of the last two weeks. Melinda Ruiz obtained a score of *** suggesting {GBPHQ9SEVERITY:21752}. Melinda Ruiz finds the endorsed symptoms to be {gbphq9difficulty:21754}. Melinda Ruiz in doing things ***  Melinda Ruiz, Melinda Ruiz, Melinda Ruiz ***  Melinda Ruiz, Melinda Ruiz ***  Melinda Ruiz ***  Melinda Ruiz ***  Melinda Ruiz ***  Melinda concentrating on things, such as reading the newspaper Melinda watching television ***  Moving Melinda speaking so slowly that other people could have noticed? Melinda the opposite --- being so fidgety Melinda restless that you have been moving around a lot more than usual ***  Thoughts that you would be better off dead Melinda hurting yourself in some way ***  PHQ-9 Score ***    The Generalized Anxiety Disorder-7 (GAD-7) is a brief self-report measure that assesses symptoms of anxiety over the course of the last two weeks. Melinda Ruiz obtained a score of *** suggesting {gbgad7severity:21753}. Melinda Ruiz finds the endorsed symptoms to be {gbphq9difficulty:21754}. 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 Tenlee has demonstrated progress in her goal as evidenced by {gbtxprogress:22839}. Chante also reported {gbtxprogress2:22951}.  Plan: Lenna Sciara  continues to appear able and willing to participate as evidenced by engagement in reciprocal conversation, and asking questions for clarification as appropriate. The next appointment will be scheduled in {gbweeks:21758}, which will be via [employer]. The next session will focus on reviewing learned skills, and working towards the established treatment goal.***

## 2019-09-19 ENCOUNTER — Institutional Professional Consult (permissible substitution): Payer: Self-pay | Admitting: Neurology

## 2019-09-19 ENCOUNTER — Telehealth: Payer: Self-pay

## 2019-09-19 NOTE — Telephone Encounter (Signed)
Pt did not show for their appt with Dr. Athar today.  

## 2019-09-20 ENCOUNTER — Ambulatory Visit (INDEPENDENT_AMBULATORY_CARE_PROVIDER_SITE_OTHER): Payer: Medicare Other | Admitting: Family Medicine

## 2019-09-20 ENCOUNTER — Encounter (INDEPENDENT_AMBULATORY_CARE_PROVIDER_SITE_OTHER): Payer: Self-pay

## 2019-09-20 ENCOUNTER — Encounter: Payer: Self-pay | Admitting: Neurology

## 2019-09-20 ENCOUNTER — Ambulatory Visit: Payer: Medicare Other | Admitting: Sports Medicine

## 2019-09-20 ENCOUNTER — Ambulatory Visit (INDEPENDENT_AMBULATORY_CARE_PROVIDER_SITE_OTHER): Payer: Medicare Other | Admitting: Psychology

## 2019-09-20 ENCOUNTER — Telehealth (INDEPENDENT_AMBULATORY_CARE_PROVIDER_SITE_OTHER): Payer: Self-pay | Admitting: Psychology

## 2019-09-20 NOTE — Telephone Encounter (Addendum)
  Office: [phone]  /  Fax: [phone]  Date of Call: September 20, 2019  Time of Call: 8:02am Duration of Call: 4 minutes Provider: Glennie Isle, PsyD  CONTENT: This provider called Adyn to check-in as she did not present for today's Webex appointment at 8:00am. She explained she picked up an extra shift at work; therefore, she requested to re-schedule today's appointment. She verbally acknowledged understanding that this provider's first follow-up appointment availability would be in approximately 3 weeks. A brief risk assessment was completed. Hettie denied experiencing suicidal and homicidal ideation, plan, or intent since the last appointment with this provider. She also did not endorse any safety concerns.    PLAN:  Nakyah is scheduled for an appointment with this provider on October 11, 2019 at 10:30am. She is also scheduled for an initial appointment on October 03, 2019 per the referral placed for longer-term therapeutic services. Deolinda requested to continue meeting with this provider to address emotional eating.

## 2019-09-26 ENCOUNTER — Encounter: Payer: Self-pay | Admitting: Neurology

## 2019-09-26 ENCOUNTER — Other Ambulatory Visit: Payer: Self-pay

## 2019-09-26 ENCOUNTER — Ambulatory Visit (INDEPENDENT_AMBULATORY_CARE_PROVIDER_SITE_OTHER): Payer: Medicare Other | Admitting: Neurology

## 2019-09-26 VITALS — BP 132/80 | HR 90 | Temp 97.5°F | Ht 60.0 in | Wt 287.0 lb

## 2019-09-26 DIAGNOSIS — R0683 Snoring: Secondary | ICD-10-CM | POA: Diagnosis not present

## 2019-09-26 DIAGNOSIS — G4719 Other hypersomnia: Secondary | ICD-10-CM

## 2019-09-26 DIAGNOSIS — Z6841 Body Mass Index (BMI) 40.0 and over, adult: Secondary | ICD-10-CM

## 2019-09-26 DIAGNOSIS — R635 Abnormal weight gain: Secondary | ICD-10-CM | POA: Diagnosis not present

## 2019-09-26 DIAGNOSIS — R51 Headache: Secondary | ICD-10-CM

## 2019-09-26 DIAGNOSIS — R351 Nocturia: Secondary | ICD-10-CM

## 2019-09-26 DIAGNOSIS — R519 Headache, unspecified: Secondary | ICD-10-CM

## 2019-09-26 NOTE — Progress Notes (Signed)
Subjective:    Patient ID: Melinda Ruiz is a 38 y.o. female.  HPI     Star Age, MD, PhD [facility] [address] [place], [place] [address]  Dear Dr. Adair Patter, I saw your patient, Gladis Henion, upon your kind request in my sleep clinic today for initial consultation of her sleep disorder, in particular, concern for underlying obstructive sleep apnea.  The patient is unaccompanied today.  She missed an appointment on 09/19/2019.  As you know, [name] is a 38 year old right-handed woman with an underlying medical history of hypertension, diabetes, mood disorder, kidney stones, hypertension, Chiari I, carpal tunnel, reflux disease, migraine headaches (has seen Dr. Krista Blue) and morbid obesity with a BMI of over 50, who reports snoring and excessive daytime somnolence.  I reviewed your office note from 09/08/2019.  She has had prior sleep studies.  She had a baseline sleep study on 08/08/2014 which was negative for obstructive sleep apnea, AHI was 4.4/h, O2 nadir was 80%.  She had occasional moderate snoring.  She had a baseline sleep study on 06/01/2016 which showed an AHI of 2/h, O2 nadir was 91%.  She had a PLM index of 71.32/h, she had a arousal index with leg movements of 4.1/h.  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  Both studies were interpreted by Dr. [name]. She reports that some time in 2016 she was fitted with a CPAP machine but never received 1. She has not been on CPAP therapy.  She wakes up gasping for air.  Her Epworth sleepiness score is 16 out of 24, fatigue severity score is 48 out of 63.  She has nocturia about once per average night and reports recurrent morning headaches.  She has a cousin with sleep apnea on a CPAP machine.  She would be willing to consider CPAP therapy.  She has gained weight over time.  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  She works till 12:30 AM and has to pick up her daughter from  her mother's place which is about 20 minutes away from her house.  She has a 61 year old son who stays at home but her 34-year-old daughter stays with grandmother until patient can pick her up after work.  Patient is typically in bed by 130 or 2 AM and rise time is between 7 and 8 AM to get her kids ready for school.  She drinks caffeine at work in the form of 1 large coffee or 1 energy drink.  She quit smoking some 5 years ago and drinks alcohol occasionally.  She has a TV in her bedroom but turns it off at night.  She has no pets in the household.  She lives with her 2 children.   Her Past Medical History Is Significant For: Past Medical History:  Diagnosis Date  . Acid reflux   . Anemia    during pregancy  . Bipolar disorder ([place])   . Chiari I malformation ([place])   . Complication of anesthesia    "Oxygen level drops"  . Constipation   . Depression   . Diabetes mellitus without complication ([place])   . Family history of adverse reaction to anesthesia    thinks Mom has problem with oxygen level dropping  . Fibroid   . Gallbladder problem   . Headache(784.0)   . Hypertension    takes meds daily  . Kidney stones 2010  . Lactose intolerance   . Mental disorder  Bi poloar,  Schizohrenia- not taking meds.  Has been seen at [facility]  . Morbid obesity with BMI of 50.0-59.9, adult ([place])   . Osteoarthritis   . Schizophrenia ([place])   . Sleep apnea   . Ulcer of abdomen wall [facility])     Her Past Surgical History Is Significant For: Past Surgical History:  Procedure Laterality Date  . BREATH TEK H PYLORI N/A 05/31/2015   Procedure: BREATH TEK H PYLORI;  Surgeon: Greer Pickerel, MD;  Location: [facility];  Service: General;  Laterality: N/A;  . CARPAL TUNNEL RELEASE  02/03/2012   Procedure: CARPAL TUNNEL RELEASE;  Surgeon: Meredith Pel, MD;  Location: [place];  Service: Orthopedics;  Laterality: Left;  left carpal tunnel release  . CARPAL TUNNEL RELEASE  01/11/2013   Procedure: CARPAL  TUNNEL RELEASE;  Surgeon: Meredith Pel, MD;  Location: [place];  Service: Orthopedics;  Laterality: Right;  Hand Set, Small Retractors, Lead Hand  . CESAREAN SECTION  2006, 2011  . CHOLECYSTECTOMY  06/2011  . cystectomy    . GANGLION CYST EXCISION  02/03/2012   Procedure: REMOVAL GANGLION OF WRIST;  Surgeon: Meredith Pel, MD;  Location: [place];  Service: Orthopedics;  Laterality: Left;  left dorsal ganglion cyst excision  . LAPAROSCOPIC GASTRIC SLEEVE RESECTION N/A 10/02/2015   Procedure: LAPAROSCOPIC GASTRIC SLEEVE RESECTION;  Surgeon: Greer Pickerel, MD;  Location: [facility];  Service: General;  Laterality: N/A;  . Lymp Glands Removed--under arm bil    . Lympectomy    . MULTIPLE TOOTH EXTRACTIONS  2003   and wisdom teeth  . Pilonidial Cyst Removed  1990's  . TUBAL LIGATION  2011  . UPPER GI ENDOSCOPY  10/02/2015   Procedure: UPPER GI ENDOSCOPY;  Surgeon: Greer Pickerel, MD;  Location: [facility];  Service: General;;    Her Family History Is Significant For: Family History  Problem Relation Age of Onset  . Kidney cancer Father   . Diabetes Mellitus II Father   . Hypertension Father   . Arthritis Mother   . Diabetes Mellitus II Mother   . Hypertension Mother   . Diabetes Mother   . Anxiety disorder Mother   . Anesthesia problems Neg Hx     Her Social History Is Significant For: Social History   Socioeconomic History  . Marital status: Single    Spouse name: Not on file  . Number of children: 2  . Years of education: 43  . Highest education level: Not on file  Occupational History  . Occupation: Dealer  Social Needs  . Financial resource strain: Not on file  . Food insecurity    Worry: Not on file    Inability: Not on file  . Transportation needs    Medical: Not on file    Non-medical: Not on file  Tobacco Use  . Smoking status: Former Smoker    Packs/day: 0.25    Types: Cigarettes    Quit date: 10/01/2009    Years since quitting: 9.9  . Smokeless  tobacco: Never Used  Substance and Sexual Activity  . Alcohol use: Yes    Comment: has a drink twice weekly  . Drug use: No  . Sexual activity: Yes    Birth control/protection: Pill  Lifestyle  . Physical activity    Days per week: Not on file    Minutes per session: Not on file  . Stress: Not on file  Relationships  . Social connections    Talks  on phone: Not on file    Gets together: Not on file    Attends religious service: Not on file    Active member of club or organization: Not on file    Attends meetings of clubs or organizations: Not on file    Relationship status: Not on file  Other Topics Concern  . Not on file  Social History Narrative   Lives at home with children.   Right-handed.   No caffeine use.    Her Allergies Are:  Allergies  Allergen Reactions  . Ibuprofen Itching  . Penicillins Hives    .Has patient had a PCN reaction causing immediate rash, facial/tongue/throat swelling, SOB or lightheadedness with hypotension: No Has patient had a PCN reaction causing severe rash involving mucus membranes or skin necrosis: No Has patient had a PCN reaction that required hospitalization No Has patient had a PCN reaction occurring within the last 10 years: No If all of the above answers are "NO", then may proceed with Cephalosporin use.  Marland Kitchen Oxycodone-Acetaminophen Itching  :   Her Current Medications Are:  Outpatient Encounter Medications as of 09/26/2019  Medication Sig  . acetaminophen (TYLENOL) 500 MG tablet Take 1,000 mg by mouth every 6 (six) hours as needed for moderate pain.  Marland Kitchen diclofenac sodium (VOLTAREN) 1 % GEL Apply 2 g topically 4 (four) times daily.  . norethindrone (MICRONOR) 0.35 MG tablet Take 1 tablet (0.35 mg total) by mouth daily.  . pantoprazole (PROTONIX) 40 MG tablet Take 1 tablet (40 mg total) by mouth daily.  . sertraline (ZOLOFT) 25 MG tablet Take 1 tablet (25 mg total) by mouth daily.  . sucralfate (CARAFATE) 1 g tablet TAKE 1 TABLET BY  MOUTH EVERY 6 HOURS AS NEEDED FOR ULCERS. Can crush and mix with water or applesauce if needed.  . Vitamin D, Ergocalciferol, (DRISDOL) 1.25 MG (50000 UT) CAPS capsule Take 1 capsule (50,000 Units total) by mouth every 7 (seven) days.   No facility-administered encounter medications on file as of 09/26/2019.   :  Review of Systems:  Out of a complete 14 point review of systems, all are reviewed and negative with the exception of these symptoms as listed below: Review of Systems  Neurological:       Here for sleep consult: reports she had a sleep study back in 2016 or 2017 and was fitted for cpap mask but never received machine. Pt reports she does snore and will wake gasping for air.  Epworth Sleepiness Scale 0= would never doze 1= slight chance of dozing 2= moderate chance of dozing 3= high chance of dozing  Sitting and reading:3 Watching TV:3 Sitting inactive in a public place (ex. Theater or meeting):2 As a passenger in a car for an hour without a break:3 Lying down to rest in the afternoon:2 Sitting and talking to someone:0 Sitting quietly after lunch (no alcohol):0 In a car, while stopped in traffic:3 Total:16    Objective:  Neurological Exam  Physical Exam Physical Examination:   Vitals:   09/26/19 1055  BP: 132/80  Pulse: 90  Temp: (!) 97.5 F (36.4 C)    General Examination: The patient is a very pleasant 38 y.o. female in no acute distress. She appears well-developed and well-nourished and well groomed.   HEENT: Normocephalic, atraumatic, pupils are equal, round and reactive to light and accommodation. Funduscopic exam is normal with sharp disc margins noted. Extraocular tracking is good without limitation to gaze excursion or nystagmus noted. Normal smooth pursuit is noted.  Hearing is grossly intact. Tympanic membranes are clear bilaterally. Face is symmetric with normal facial animation and normal facial sensation. Speech is clear with no dysarthria noted. There  is no hypophonia. There is no lip, neck/head, jaw or voice tremor. Neck is supple with full range of passive and active motion. There are no carotid bruits on auscultation. Oropharynx exam reveals: mild mouth dryness, adequate dental hygiene and moderate airway crowding, due to Tonsillar size of about 3+, longer uvula.  Tongue is normal in size, Mallampati class II.  Neck circumference is 16-1/4 inches.  She has A mild to moderate overbite. Tongue protrudes centrally and palate elevates symmetrically.   Chest: Clear to auscultation without wheezing, rhonchi or crackles noted.  Heart: S1+S2+0, regular and normal without murmurs, rubs or gallops noted.   Abdomen: Soft, non-tender and non-distended with normal bowel sounds appreciated on auscultation.  Extremities: There is no pitting edema in the distal lower extremities bilaterally.   Skin: Warm and dry without trophic changes noted.  Musculoskeletal: exam reveals no obvious joint deformities, tenderness or joint swelling or erythema.   Neurologically:  Mental status: The patient is awake, alert and oriented in all 4 spheres. Her immediate and remote memory, attention, language skills and fund of knowledge are appropriate. There is no evidence of aphasia, agnosia, apraxia or anomia. Speech is clear with normal prosody and enunciation. Thought process is linear. Mood is normal and affect is normal.  Cranial nerves II - XII are as described above under HEENT exam. In addition: shoulder shrug is normal with equal shoulder height noted. Motor exam: Normal bulk, strength and tone is noted. There is no tremor. Romberg is neg. Fine motor skills and coordination: grossly intact.  Cerebellar testing: No dysmetria or intention tremor. There is no truncal or gait ataxia.  Sensory exam: intact to light touch in the upper and lower extremities.  Gait, station and balance: She stands easily. No veering to one side is noted. No leaning to one side is noted.  Posture is age-appropriate and stance is narrow based. Gait shows normal stride length and normal pace. No problems turning are noted.   Assessment and Plan:  In summary, Zaniyha Horse Zhou is a very pleasant 38 y.o.-year old female with an underlying medical history of hypertension, diabetes, mood disorder, kidney stones, hypertension, Chiari I, carpal tunnel, reflux disease, migraine headaches (has seen Dr. Krista Blue) and morbid obesity with a BMI of over 20, whose history and physical exam are concerning for obstructive sleep apnea (OSA). I had a long chat with the patient about my findings and the diagnosis of OSA, its prognosis and treatment options. We talked about medical treatments, surgical interventions and non-pharmacological approaches. I explained in particular the risks and ramifications of untreated moderate to severe OSA, especially with respect to developing cardiovascular disease down the Road, including congestive heart failure, difficult to treat hypertension, cardiac arrhythmias, or stroke. Even type 2 diabetes has, in part, been linked to untreated OSA. Symptoms of untreated OSA include daytime sleepiness, memory problems, mood irritability and mood disorder such as depression and anxiety, lack of energy, as well as recurrent headaches, especially morning headaches. We talked about trying to maintain a healthy lifestyle in general, as well as the importance of weight control. We also talked about the importance of good sleep hygiene. I recommended the following at this time: sleep study.   I explained the sleep test procedure to the patient and also outlined possible surgical and non-surgical treatment options of OSA, including  the use of a custom-made dental device (which would require a referral to a specialist dentist or oral surgeon), upper airway surgical options, such as traditional UPPP or a novel less invasive surgical option in the form of Inspire hypoglossal nerve stimulation (which  would involve a referral to an ENT surgeon). I also explained the CPAP treatment option to the patient, who indicated that she would be willing to try CPAP if the need arises. I explained the importance of being compliant with PAP treatment, not only for insurance purposes but primarily to improve Her symptoms, and for the patient's long term health benefit, including to reduce Her cardiovascular risks. I answered all her questions today and the patient was in agreement. I plan to see her back after the sleep study is completed and encouraged her to call with any interim questions, concerns, problems or updates.   Thank you very much for allowing me to participate in the care of this nice patient. If I can be of any further assistance to you please do not hesitate to call me at [phone].  Sincerely,   Star Age, MD, PhD

## 2019-09-26 NOTE — Patient Instructions (Signed)

## 2019-10-03 ENCOUNTER — Ambulatory Visit: Payer: Medicare Other | Admitting: Psychology

## 2019-10-05 NOTE — Progress Notes (Signed)
Office: [phone]  /  Fax: [phone]    Date: October 11, 2019   Appointment Start Time: 10:34am Duration: 22 minutes Provider: Glennie Isle, Psy.D. Type of Session: Individual Therapy  Location of Patient: Home Location of Provider: Provider's Home Type of Contact: Telepsychological Visit via [employer] WebEx   Session Content:Of note, this provider called Mohini at 10:32am as she did not present for the [facility] appointment. She indicated she could not locate the e-mail; therefore, the e-mail with the secure link was re-sent. As such, today's appointment was initiated 4 minutes late. Melinda Ruiz is a 38 y.o. female presenting via [place] for a follow-up appointment to address the previously established treatment goal of decreasing emotional eating. Today's appointment was a telepsychological visit, as it is an option for appointments to reduce exposure to COVID-19. Melinda Ruiz expressed understanding regarding the rationale for telepsychological services, and provided verbal consent for today's appointment. Prior to proceeding with today's appointment, Melinda Ruiz's physical location at the time of this appointment was obtained. Lonia reported she was at home and provided the address. In the event of technical difficulties, Melinda Ruiz shared a phone number she could be reached at. Melinda Ruiz and this provider participated in today's telepsychological service. Also, Chellsie denied anyone else being present in the room or on the WebEx appointment.  This provider conducted a brief check-in and verbally administered the PHQ-9 and GAD-7. Melinda Ruiz shared she has been focusing on work. She noted she rescheduled her appointment with the new provider based on the referral placed with [place]. In addition, she denied any current safety concerns as it relates to her cousin's death.   Melinda Ruiz reported ongoing difficulty consuming all food indicated on her meal plan. This was further explored and it  was identified her ongoing worry and work related stress is impacting her ability to focus on her eating. She noted the aforementioned has also triggered emotional eating. Thus, psychoeducation regarding the importance of self-care using the oxygen mask metaphor was provided. Psychoeducation regarding pleasurable activities, including its impact on emotional eating and overall well-being was also provided. Melinda Ruiz was provided with a handout with various options of pleasurable activities, and was encouraged to engage in one activity a day and additional activities as needed when triggered to emotionally eat. Melinda Ruiz agreed and noted a plan to go to the mall and arcade this weekend as a pleasurable activity. Melinda Ruiz provided verbal consent during today's appointment for this provider to send the handout about pleasurable activities via e-mail. Overall, Melinda Ruiz was receptive to today's session as evidenced by openness to sharing, responsiveness to feedback, and willingness to engage in pleasurable activities.  Mental Status Examination:  Appearance: neat Behavior: cooperative Mood: euthymic Affect: mood congruent Speech: normal in rate, volume, and tone Eye Contact: appropriate Psychomotor Activity: appropriate Thought Process: linear, logical, and goal directed  Content/Perceptual Disturbances: no hallucinations, delusions, bizarre thinking or behavior reported or observed and no evidence of suicidal and homicidal ideation, plan, and intent Orientation: time, person, place and purpose of appointment Cognition/Sensorium: memory, attention, language, and fund of knowledge intact  Insight: fair Judgment: fair  Structured Assessment Results: The Patient Health Questionnaire-9 (PHQ-9) is a self-report measure that assesses symptoms and severity of depression over the course of the last two weeks. Melinda Ruiz obtained a score of 5 suggesting mild depression. Melinda Ruiz finds the endorsed symptoms to be somewhat  difficult. Little interest or pleasure in doing things 0  Feeling down, depressed, or hopeless 0  Trouble falling or staying asleep, or sleeping too  much 1  Feeling tired or having little energy 1  Poor appetite or overeating 1  Feeling bad about yourself --- or that you are a failure or have let yourself or your family down 0  Trouble concentrating on things, such as reading the newspaper or watching television 1  Moving or speaking so slowly that other people could have noticed? Or the opposite --- being so fidgety or restless that you have been moving around a lot more than usual 1  Thoughts that you would be better off dead or hurting yourself in some way 0  PHQ-9 Score 5    The Generalized Anxiety Disorder-7 (GAD-7) is a brief self-report measure that assesses symptoms of anxiety over the course of the last two weeks. Melinda Ruiz obtained a score of 13 suggesting moderate anxiety. Melinda Ruiz finds the endorsed symptoms to be not difficult at all. Feeling nervous, anxious, on edge 1  Not being able to stop or control worrying 3  Worrying too much about different things 3  Trouble relaxing 1  Being so restless that it's hard to sit still 1  Becoming easily annoyed or irritable 3  Feeling afraid as if something awful might happen 1  GAD-7 Score 13   Interventions:  Conducted a brief chart review Verbal administration of PHQ-9 and GAD-7 for symptom monitoring Provided empathic reflections and validation Reviewed content from the previous session Psychoeducation provided regarding pleasurable activities Employed supportive psychotherapy interventions to facilitate reduced distress, and to improve coping skills with identified stressors Psychoeducation was provided regarding self-care  DSM-5 Diagnosis: 296.31 (F33.0) Major Depressive Disorder, Recurrent Episode, Mild, With Anxious Distress  Treatment Goal & Progress: During the initial appointment with this provider, the following treatment  goal was established: decrease emotional eating. Melinda Ruiz has demonstrated progress in her goal as evidenced by increased awareness of hunger patterns and triggers for emotional eating. Melinda Ruiz also demonstrates willingness to engage in pleasurable activities.  Plan: Melinda Ruiz continues to appear able and willing to participate as evidenced by engagement in reciprocal conversation, and asking questions for clarification as appropriate. Based on Melinda Ruiz's request for a Monday appointment and appointment availability, the next appointment will be scheduled in 3-4 weeks, which will be via [employer] WebEx. The next session will focus on reviewing learned skills, and working towards the established treatment goal.

## 2019-10-10 ENCOUNTER — Encounter: Payer: Self-pay | Admitting: Family Medicine

## 2019-10-10 ENCOUNTER — Ambulatory Visit (INDEPENDENT_AMBULATORY_CARE_PROVIDER_SITE_OTHER): Payer: Medicare Other | Admitting: Family Medicine

## 2019-10-10 ENCOUNTER — Other Ambulatory Visit: Payer: Self-pay

## 2019-10-10 VITALS — BP 130/80 | HR 106 | Wt 285.6 lb

## 2019-10-10 DIAGNOSIS — N939 Abnormal uterine and vaginal bleeding, unspecified: Secondary | ICD-10-CM | POA: Diagnosis present

## 2019-10-10 DIAGNOSIS — N938 Other specified abnormal uterine and vaginal bleeding: Secondary | ICD-10-CM | POA: Diagnosis not present

## 2019-10-10 LAB — POCT URINE PREGNANCY: Preg Test, Ur: NEGATIVE

## 2019-10-10 NOTE — Patient Instructions (Signed)
It was wonderful seeing you today.  We will just check your blood count to make sure that you are not anemic.  For your bleeding, we will get an ultrasound to ease your mind and then consider adding Provera vs discuss procedure options with OB/GYN to minimize the bleeding.

## 2019-10-10 NOTE — Assessment & Plan Note (Addendum)
Chronic history of irregular menstrual bleeding with menorrhagia, however acute concern per pt due to two cycles close together.  Suspect multifactorial with continued ovulatory dysfunction in the setting of morbid obesity and possible uterine fibroid/adenomyosis with U/S in 2016 showing prominent uterus with heterogeneous echogenicity. Could also consider PCOS contributing given long-term history of irregular cycles and mild hirsutism on exam (could consider starting Metformin on future visit).  Reassuringly U preg negative in the office today in the setting of known tubal ligation, also makes pregnancy less likely. TSH WNL in 07/2019 and diabetes diet/well controlled.  No concerning GU symptomatology to suggest precipitating infection. Will obtain a transvaginal U/S to further assess structural component given previous U/S results. -CBC to assess for anemia given menorrhagia -Transvaginal complete pelvic ultrasound -Monitor cycles, likely needs increased progestin component, patient considering trying Provera vs touching base with OB/GYN for endometrial procedures (not interested in retrying Micronor or IUD) -Counseled on continuing with healthy weight and wellness towards weight loss

## 2019-10-10 NOTE — Progress Notes (Signed)
Subjective:    Patient ID: ERIQA TEMBY, female    DOB: [birth year], 38 y.o.   MRN: [id number]   CC: Abnormal uterine bleeding  HPI: Ms. Racca is a 38 year old female with elevated BMI, history of menorrhagia, T2 DM, OSA presenting discuss the following:  Irregular menstrual bleeding: Seeking care today after having 2 menstrual cycles within 2.5 weeks of each other.  Said she had her normal LMP at the beginning of October, then had about 2-3 days of menstrual bleeding over the weekend.  This is finished now.  She is a little concerned today because last time she had a quick turnaround for her menstrual cycle she had a miscarriage, this was 9 years ago. Has a history of irregular cycles, usually every 1-2 months.  Additionally endorses heavy bleeding during her cycles, usually 7 days.  Not on contraception currently.  Had been on Micronor and IUD in the past, both effective to help her bleeding however did not like the IUD due to recurrent yeast infections and could not remember to take the Micronor (stop Micronor in July).  Not interested in other forms of hormonal contraception.  She is sexually active, does not use barrier protection, and monogamous relationship.  Had a tubal ligation about 8 years ago after her second child.  Currently denies any vaginal itching/irritation, dysuria.  Denies concern for STDs, does not want to be screened.  Having some abdominal cramping.  She would like to have a pelvic ultrasound completed because she was told she had fibroids in the past that " disappeared" on her most recent ultrasound in 2016.  Has not been told she has PCOS in the past.  Previously seen by OB/GYN in 2019 for menorrhagia, discussed possibly doing surgery in the future if other therapies not effective or working for the patient.  Currently following with healthy weight and wellness to lose weight.  Also looking into having her gastric sleeve reversed because she is struggling with eating  certain portions.   Smoking status reviewed  Review of Systems Per HPI   Objective:  BP 130/80   Pulse (!) 106   Wt 285 lb 9.6 oz (129.5 kg)   SpO2 98%   BMI 55.78 kg/m  Vitals and nursing note reviewed  General: NAD, pleasant Cardiac: RRR, normal heart sounds, no murmurs Respiratory: CTAB, normal effort Abdomen: Soft large abdomen, mild tenderness to palpation in the left lower quadrant, nondistended Extremities: no edema or cyanosis. WWP. Skin: warm and dry, no rashes noted Neuro: alert and oriented, no focal deficits Psych: normal affect  Assessment & Plan:   Dysfunctional uterine bleeding Chronic history of irregular menstrual bleeding with menorrhagia, however acute concern per pt due to two cycles close together.  Suspect multifactorial with continued ovulatory dysfunction in the setting of morbid obesity and possible uterine fibroid/adenomyosis with U/S in 2016 showing prominent uterus with heterogeneous echogenicity. Could also consider PCOS contributing given long-term history of irregular cycles and mild hirsutism on exam (could consider starting Metformin on future visit).  Reassuringly U preg negative in the office today in the setting of known tubal ligation, also makes pregnancy less likely. TSH WNL in 07/2019 and diabetes diet/well controlled.  No concerning GU symptomatology to suggest precipitating infection. Will obtain a transvaginal U/S to further assess structural component given previous U/S results. -CBC to assess for anemia given menorrhagia -Transvaginal complete pelvic ultrasound -Monitor cycles, likely needs increased progestin component, patient considering trying Provera vs touching base with OB/GYN for endometrial procedures (  not interested in retrying Micronor or IUD) -Counseled on continuing with healthy weight and wellness towards weight loss   Follow-up in 2 months for above or sooner if needed.  [facility] Resident PGY-2

## 2019-10-11 ENCOUNTER — Other Ambulatory Visit: Payer: Self-pay

## 2019-10-11 ENCOUNTER — Ambulatory Visit (INDEPENDENT_AMBULATORY_CARE_PROVIDER_SITE_OTHER): Payer: Medicare Other | Admitting: Psychology

## 2019-10-11 ENCOUNTER — Encounter: Payer: Medicare Other | Admitting: Neurology

## 2019-10-11 DIAGNOSIS — F330 Major depressive disorder, recurrent, mild: Secondary | ICD-10-CM | POA: Diagnosis not present

## 2019-10-11 LAB — CBC
Hematocrit: 37 % (ref 34.0–46.6)
Hemoglobin: 11.8 g/dL (ref 11.1–15.9)
MCH: 27.4 pg (ref 26.6–33.0)
MCHC: 31.9 g/dL (ref 31.5–35.7)
MCV: 86 fL (ref 79–97)
Platelets: 367 10*3/uL (ref 150–450)
RBC: 4.31 x10E6/uL (ref 3.77–5.28)
RDW: 14.3 % (ref 11.7–15.4)
WBC: 9.2 10*3/uL (ref 3.4–10.8)

## 2019-10-17 ENCOUNTER — Other Ambulatory Visit: Payer: Self-pay

## 2019-10-17 ENCOUNTER — Ambulatory Visit (HOSPITAL_COMMUNITY)
Admission: RE | Admit: 2019-10-17 | Discharge: 2019-10-17 | Disposition: A | Discharge status: Home / Self Care | Payer: Medicare Other | Source: Ambulatory Visit | Attending: Family Medicine | Admitting: Family Medicine

## 2019-10-17 DIAGNOSIS — N939 Abnormal uterine and vaginal bleeding, unspecified: Secondary | ICD-10-CM | POA: Diagnosis present

## 2019-10-21 ENCOUNTER — Other Ambulatory Visit: Payer: Self-pay | Admitting: Gastroenterology

## 2019-10-23 ENCOUNTER — Ambulatory Visit (INDEPENDENT_AMBULATORY_CARE_PROVIDER_SITE_OTHER): Payer: Medicare Other | Admitting: Neurology

## 2019-10-23 DIAGNOSIS — R635 Abnormal weight gain: Secondary | ICD-10-CM

## 2019-10-23 DIAGNOSIS — G4719 Other hypersomnia: Secondary | ICD-10-CM

## 2019-10-23 DIAGNOSIS — G4710 Hypersomnia, unspecified: Secondary | ICD-10-CM

## 2019-10-23 DIAGNOSIS — R351 Nocturia: Secondary | ICD-10-CM

## 2019-10-23 DIAGNOSIS — G4720 Circadian rhythm sleep disorder, unspecified type: Secondary | ICD-10-CM

## 2019-10-23 DIAGNOSIS — R0683 Snoring: Secondary | ICD-10-CM

## 2019-10-23 DIAGNOSIS — R519 Headache, unspecified: Secondary | ICD-10-CM

## 2019-10-27 NOTE — Procedures (Signed)
PATIENT'S NAME:  Melinda Ruiz, Melinda Ruiz DOB:      07/26/1981      MR#:    [id number]     DATE OF RECORDING: 10/23/2019 REFERRING M.D.:  Dr. Ilene Qua Study Performed:   Baseline Polysomnogram HISTORY: 38 year old woman with a history of hypertension, diabetes, mood disorder, kidney stones, hypertension, Chiari I, carpal tunnel, reflux disease, migraine headaches and morbid obesity with a BMI of over 50, who reports snoring and excessive daytime somnolence. The patient endorsed the Epworth Sleepiness Scale at 16 points. The patient's weight 287 pounds with a height of 60 (inches), resulting in a BMI of 56.3 kg/m2. The patient's neck circumference measured 16 inches.  CURRENT MEDICATIONS: Tylenol, Voltaren, Micronor, Protonix, Zoloft, Carafate   PROCEDURE:  This is a multichannel digital polysomnogram utilizing the Somnostar 11.2 system.  Electrodes and sensors were applied and monitored per AASM Specifications.   EEG, EOG, Chin and Limb EMG, were sampled at 200 Hz.  ECG, Snore and Nasal Pressure, Thermal Airflow, Respiratory Effort, CPAP Flow and Pressure, Oximetry was sampled at 50 Hz. Digital video and audio were recorded.      BASELINE STUDY  Lights Out was at 20:47 and Lights On at 04:59.  Total recording time (TRT) was 492.5 minutes, with a total sleep time (TST) of 376 minutes.   The patient's sleep latency was 98.5 minutes, which is delayed. REM latency was 92 minutes, which is normal. The sleep efficiency was 76.3 %.     SLEEP ARCHITECTURE: WASO (Wake after sleep onset) was 65 minutes with one longer period of wakefulness and otherwise mild sleep fragmentation noted. There were 14.5 minutes in Stage N1, 142 minutes Stage N2, 129.5 minutes Stage N3 and 90 minutes in Stage REM.  The percentage of Stage N1 was 3.9%, Stage N2 was 37.8%, Stage N3 was 34.4% and Stage R (REM sleep) was 23.9%, which is normal.  RESPIRATORY ANALYSIS:  There were a total of 7 respiratory events:  2 obstructive  apneas, 0 central apneas and 0 mixed apneas with a total of 2 apneas and an apnea index (AI) of .3 /hour. There were 5 hypopneas with a hypopnea index of .8 /hour. The patient also had 0 respiratory event related arousals (RERAs).      The total APNEA/HYPOPNEA INDEX (AHI) was 1.1 /hour and the total RESPIRATORY DISTURBANCE INDEX was 0. 1.1 /hour.  7 events occurred in REM sleep and 0 events in NREM. The REM AHI was 4.7 /hour, versus a non-REM AHI of 0. The patient spent 0 minutes of total sleep time in the supine position and 376 minutes in non-supine.. The supine AHI was 0.0 versus a non-supine AHI of 1.1.  OXYGEN SATURATION & C02:  The Wake baseline 02 saturation was 98%, with the lowest being 87%. Time spent below 89% saturation equaled 1 minutes.    PERIODIC LIMB MOVEMENTS: The patient had a total of 0 Periodic Limb Movements.  The Periodic Limb Movement (PLM) index was 0 and the PLM Arousal index was 0/hour. The arousals were noted as: 35 were spontaneous, 0 were associated with PLMs, 1 were associated with respiratory events.  Audio and video analysis did not show any abnormal or unusual movements, behaviors, phonations or vocalizations. The patient took no bathroom breaks. Mild intermittent snoring was noted. The EKG was in keeping with normal sinus rhythm (NSR).  Post-study, the patient indicated that sleep was better than usual.   IMPRESSION:  1. Primary Snoring 2. Dysfunctions associated with sleep stages or arousal from  sleep  RECOMMENDATIONS:  1. This study does not demonstrate any significant obstructive or central sleep disordered breathing. Mild intermittent snoring was noted. The absence of supine sleep may underestimate her sleep disordered breathing. Snoring may improve with weight loss. CPAP or autoPAP therapy is not warranted. This study does not support an intrinsic sleep disorder as a cause of the patient's symptoms. Other causes, including circadian rhythm disturbances, an  underlying mood disorder, medication effect and/or an underlying medical problem cannot be ruled out. 2. This study shows sleep fragmentation and abnormal sleep stage percentages; these are nonspecific findings and per se do not signify an intrinsic sleep disorder or a cause for the patient's sleep-related symptoms. Causes include (but are not limited to) the first night effect of the sleep study, circadian rhythm disturbances, medication effect or an underlying mood disorder or medical problem.  3. The patient should be cautioned not to drive, work at heights, or operate dangerous or heavy equipment when tired or sleepy. Review and reiteration of good sleep hygiene measures should be pursued with any patient. 4. The patient will be advised to follow up with the referring provider, who will be notified of the test results.  I certify that I have reviewed the entire raw data recording prior to the issuance of this report in accordance with the Standards of Accreditation of the American Academy of Sleep Medicine (AASM)   Star Age, MD, PhD Diplomat, American Board of Neurology and Sleep Medicine (Neurology and Sleep Medicine)

## 2019-10-27 NOTE — Progress Notes (Signed)
Office: [phone]  /  Fax: [phone]    Date: November 07, 2019   Appointment Start Time: 10:34am Duration: 27 minutes Provider: Glennie Isle, Psy.D. Type of Session: Individual Therapy  Location of Patient: Home Location of Provider: Provider's Home Type of Contact: Telepsychological Visit via [employer] WebEx   Session Content: Of note, this provider called Melinda Ruiz at 10:32am as she did not present for the [facility] appointment. She indicated she did not have the e-mail; therefore, the e-mail with the secure link was re-sent. As such, today's appointment was initiated 4 minutes late.  Melinda Ruiz is a 38 y.o. female presenting via [place] for a follow-up appointment to address the previously established treatment goal of decreasing emotional eating. Today's appointment was a telepsychological visit, as it is an option for appointments to reduce exposure to COVID-19. Melinda Ruiz expressed understanding regarding the rationale for telepsychological services, and provided verbal consent for today's appointment. Prior to proceeding with today's appointment, Melinda Ruiz's physical location at the time of this appointment was obtained. In the event of technical difficulties, Melinda Ruiz shared a phone number she could be reached at. Melinda Ruiz and this provider participated in today's telepsychological service. Also, Melinda Ruiz denied anyone else being present in the room or on the WebEx appointment.  This provider conducted a brief check-in and verbally administered the PHQ-9 and GAD-7. Melinda Ruiz shared about recent events, including her birthday celebration. It was recommended she call the clinic to schedule an appointment with a provider to address eating concerns; she agreed. In addition, it was again recommended she call [facility] to schedule her initial appointment; she agreed. Regarding eating, Melinda Ruiz reported difficulty eating congruent to the meal plan, but she reported a reduction in carbohydrate  intake. In addition, pleasurable activities were reviewed to assist with overall coping. She reported engaging in some activities with her family. It was recommended she continue to engage in pleasurable activities regularly. Due to ongoing sleeping difficulties, psychoeducation regarding sleep hygiene (e.g., establishing a routine, avoiding caffeine, creating a comfortable environment) was provided. Melinda Ruiz provided verbal consent during today's appointment for this provider to send a handout about sleep hygiene via e-mail. Melinda Ruiz was receptive to today's session as evidenced by openness to sharing, responsiveness to feedback, and willingness to implement sleep hygiene strategies.  Mental Status Examination:  Appearance: neat Behavior: cooperative Mood: euthymic Affect: mood congruent Speech: normal in rate, volume, and tone Eye Contact: appropriate Psychomotor Activity: appropriate Thought Process: linear, logical, and goal directed  Content/Perceptual Disturbances: no hallucinations, delusions, bizarre thinking or behavior reported or observed and no evidence of suicidal and homicidal ideation, plan, and intent Orientation: time, person, place and purpose of appointment Cognition/Sensorium: memory, attention, language, and fund of knowledge intact  Insight: fair Judgment: fair  Structured Assessment Results: The Patient Health Questionnaire-9 (PHQ-9) is a self-report measure that assesses symptoms and severity of depression over the course of the last two weeks. Melinda Ruiz obtained a score of 8 suggesting mild depression. Melinda Ruiz finds the endorsed symptoms to be somewhat difficult. Little interest or pleasure in doing things 0  Feeling down, depressed, or hopeless 0  Trouble falling or staying asleep, or sleeping too much 3  Feeling tired or having little energy 3  Poor appetite or overeating 1  Feeling bad about yourself --- or that you are a failure or have let yourself or your family down  0  Trouble concentrating on things, such as reading the newspaper or watching television 1  Moving or speaking so slowly that other people could  have noticed? Or the opposite --- being so fidgety or restless that you have been moving around a lot more than usual 0  Thoughts that you would be better off dead or hurting yourself in some way 0  PHQ-9 Score 8    The Generalized Anxiety Disorder-7 (GAD-7) is a brief self-report measure that assesses symptoms of anxiety over the course of the last two weeks. Melinda Ruiz obtained a score of 10 suggesting moderate anxiety. Melinda Ruiz finds the endorsed symptoms to be somewhat difficult. Feeling nervous, anxious, on edge 0  Not being able to stop or control worrying 3  Worrying too much about different things 3  Trouble relaxing 1  Being so restless that it's hard to sit still 0  Becoming easily annoyed or irritable 3  Feeling afraid as if something awful might happen 0  GAD-7 Score 10   Interventions:  Conducted a brief chart review Verbal administration of PHQ-9 and GAD-7 for symptom monitoring Provided empathic reflections and validation Reviewed content from the previous session Psychoeducation provided regarding sleep hygiene Employed motivational interviewing skills to assess patient's willingness/desire to adhere to recommended medical treatments and assignments Reviewed learned skills Employed supportive psychotherapy interventions to facilitate reduced distress, and to improve coping skills with identified stressors  DSM-5 Diagnosis: 296.31 (F33.0) Major Depressive Disorder, Recurrent Episode, Mild, With Anxious Distress  Treatment Goal & Progress: During the initial appointment with this provider, the following treatment goal was established: decrease emotional eating. Melinda Ruiz has demonstrated progress in her goal as evidenced by increased awareness of hunger patterns and triggers for emotional eating. Melinda Ruiz also continues to demonstrate  willingness to engage in learned skill(s).  Plan: Melinda Ruiz continues to appear able and willing to participate as evidenced by engagement in reciprocal conversation, and asking questions for clarification as appropriate. Per Melinda Ruiz's request of a Monday appointment and this provider's appointment availability, the next appointment will be scheduled in three weeks, which will be via [place] WebEx. The next session will focus on the introduction of mindfulness as it was not introduced today due to [facility] presenting concerns.

## 2019-10-27 NOTE — Progress Notes (Signed)
Patient referred by Dr. Adair Patter, seen by me on 09/26/19, diagnostic PSG on 10/23/19.   Please call and notify the patient that the recent sleep study did not show any significant obstructive sleep apnea. Mild and intermittent snoring was noted. Weight loss may help alleviate her snoring. She did not achieve supine sleep. She can FU with her referring MD.   Thanks,  Star Age, MD, PhD [facility] ([place])

## 2019-10-31 ENCOUNTER — Telehealth: Payer: Self-pay

## 2019-10-31 NOTE — Telephone Encounter (Signed)
I called pt to discuss her sleep study results. No answer, left a message asking her to call me back. 

## 2019-10-31 NOTE — Telephone Encounter (Signed)
-----   Message from Star Age, MD sent at 10/27/2019  7:29 PM EDT ----- Patient referred by Dr. Adair Patter, seen by me on 09/26/19, diagnostic PSG on 10/23/19.   Please call and notify the patient that the recent sleep study did not show any significant obstructive sleep apnea. Mild and intermittent snoring was noted. Weight loss may help alleviate her snoring. She did not achieve supine sleep. She can FU with her referring MD.   Thanks,  Star Age, MD, PhD [facility] ([place])

## 2019-11-04 ENCOUNTER — Ambulatory Visit: Payer: Medicare Other | Admitting: Family Medicine

## 2019-11-07 ENCOUNTER — Ambulatory Visit (INDEPENDENT_AMBULATORY_CARE_PROVIDER_SITE_OTHER): Payer: Medicare Other | Admitting: Physician Assistant

## 2019-11-07 ENCOUNTER — Ambulatory Visit (INDEPENDENT_AMBULATORY_CARE_PROVIDER_SITE_OTHER): Payer: Medicare Other | Admitting: Psychology

## 2019-11-07 ENCOUNTER — Other Ambulatory Visit: Payer: Self-pay

## 2019-11-07 DIAGNOSIS — F330 Major depressive disorder, recurrent, mild: Secondary | ICD-10-CM

## 2019-11-08 ENCOUNTER — Other Ambulatory Visit: Payer: Self-pay

## 2019-11-08 ENCOUNTER — Ambulatory Visit (INDEPENDENT_AMBULATORY_CARE_PROVIDER_SITE_OTHER): Payer: Medicare Other | Admitting: Neurology

## 2019-11-08 ENCOUNTER — Encounter: Payer: Self-pay | Admitting: Gastroenterology

## 2019-11-08 DIAGNOSIS — G5602 Carpal tunnel syndrome, left upper limb: Secondary | ICD-10-CM

## 2019-11-08 NOTE — Telephone Encounter (Signed)
I called pt again. No answer, left a message asking her to call me back. °

## 2019-11-08 NOTE — Telephone Encounter (Signed)
Pt returned my call. I discussed her sleep study results and recommendations with her. She will follow up with Dr. Adair Patter and her PCP. Pt verbalized understanding of results. Pt had no further questions at this time but was encouraged to call back if questions arise.

## 2019-11-08 NOTE — Procedures (Signed)
[facility]  [address]  [place], [place] [address] Tel: [phone] Fax:  [phone] Test Date:  11/08/2019  Patient: Melinda Ruiz DOB: [birth year] Physician: Narda Amber, DO  Sex: Female Height: 5\' 1"  Ref Phys: Dorris Singh, MD  ID#: [id number] Temp: 32.0C Technician:    Patient Complaints: This is a 39 year old female with history of left CTS release x 2 referred for evaluation of left hand paresthesias and arm pain.  NCV & EMG Findings: Extensive electrodiagnostic testing of the left upper extremity shows:  1. Right median, ulnar, and mixed palmar sensory responses are within normal limits. 2. Right median and ulnar motor responses are within normal limits. 3. There is no evidence of active or chronic motor axonal loss changes affecting any of the tested muscles.  Motor unit configuration and recruitment pattern is within normal limits.  Impression: This is a normal study of the left upper extremity.  In particular, there is no evidence of carpal tunnel syndrome, ulnar neuropathy, or cervical radiculopathy.   ___________________________ Narda Amber, DO    Nerve Conduction Studies Anti Sensory Summary Table   Site NR Peak (ms) Norm Peak (ms) P-T Amp (V) Norm P-T Amp  Left Median Anti Sensory (2nd Digit)  32C  Wrist    3.4 <3.4 37.3 >20  Left Ulnar Anti Sensory (5th Digit)  32C  Wrist    2.9 <3.1 66.4 >12   Motor Summary Table   Site NR Onset (ms) Norm Onset (ms) O-P Amp (mV) Norm O-P Amp Site1 Site2 Delta-0 (ms) Dist (cm) Vel (m/s) Norm Vel (m/s)  Left Median Motor (Abd Poll Brev)  32C  Wrist    3.4 <3.9 11.5 >6 Elbow Wrist 5.1 33.0 65 >50  Elbow    8.5  10.3         Left Ulnar Motor (Abd Dig Minimi)  32C  Wrist    2.5 <3.1 9.9 >7 B Elbow Wrist 3.5 22.0 63 >50  B Elbow    6.0  9.2  A Elbow B Elbow 1.8 10.0 56 >50  A Elbow    7.8  8.0          Comparison Summary Table   Site NR Peak (ms) Norm Peak (ms) P-T Amp (V) Site1  Site2 Delta-P (ms) Norm Delta (ms)  Left Median/Ulnar Palm Comparison (Wrist - 8cm)  32C  Median Palm    2.0 <2.2 71.7 Median Palm Ulnar Palm 0.3   Ulnar Palm    1.7 <2.2 13.5       EMG   Side Muscle Ins Act Fibs Psw Fasc Number Recrt Dur Dur. Amp Amp. Poly Poly. Comment  Left 1stDorInt Nml Nml Nml Nml Nml Nml Nml Nml Nml Nml Nml Nml N/A  Left PronatorTeres Nml Nml Nml Nml Nml Nml Nml Nml Nml Nml Nml Nml N/A  Left Biceps Nml Nml Nml Nml Nml Nml Nml Nml Nml Nml Nml Nml N/A  Left Triceps Nml Nml Nml Nml Nml Nml Nml Nml Nml Nml Nml Nml N/A  Left Deltoid Nml Nml Nml Nml Nml Nml Nml Nml Nml Nml Nml Nml N/A      Waveforms:

## 2019-11-09 ENCOUNTER — Ambulatory Visit: Payer: Medicare Other | Admitting: Family Medicine

## 2019-11-14 ENCOUNTER — Ambulatory Visit (INDEPENDENT_AMBULATORY_CARE_PROVIDER_SITE_OTHER): Payer: Medicare Other | Admitting: Family Medicine

## 2019-11-16 ENCOUNTER — Telehealth: Payer: Self-pay | Admitting: Student in an Organized Health Care Education/Training Program

## 2019-11-16 NOTE — Telephone Encounter (Signed)
Patient had a nerve conduction study done recently and would like the results from that please.  Best contact number is [phone].

## 2019-11-16 NOTE — Progress Notes (Unsigned)
Office: [phone]  /  Fax: [phone]    Date: November 28, 2019   Appointment Start Time: *** Duration: *** minutes Provider: Glennie Isle, Psy.D. Type of Session: Individual Therapy  Location of Patient: {gbptloc:23249} Location of Provider: {Location of Service:22491} Type of Contact: Telepsychological Visit via [employer]   Session Content: Melinda Ruiz is a 38 y.o. female presenting via [place] for a follow-up appointment to address the previously established treatment goal of decreasing emotional eating. Today's appointment was a telepsychological visit, as it is an option for appointments to reduce exposure to COVID-19. Melinda Ruiz expressed understanding regarding the rationale for telepsychological services, and provided verbal consent for today's appointment. Prior to proceeding with today's appointment, Melinda Ruiz's physical location at the time of this appointment was obtained. In the event of technical difficulties, Melinda Ruiz shared a phone number she could be reached at. Melinda Ruiz and this provider participated in today's telepsychological service. Also, Melinda Ruiz denied anyone else being present in the room or on the WebEx appointment ***.  This provider conducted a brief check-in and verbally administered the PHQ-9 and GAD-7. *** Melinda Ruiz was receptive to today's session as evidenced by openness to sharing, responsiveness to feedback, and ***.  Mental Status Examination:  Appearance: {Appearance:22431} Behavior: {Behavior:22445} Mood: {gbmood:21757} Affect: {Affect:22436} Speech: {Speech:22432} Eye Contact: {Eye Contact:22433} Psychomotor Activity: {Motor Activity:22434} Thought Process: {thought process:22448}  Content/Perceptual Disturbances: {disturbances:22451} Orientation: {Orientation:22437} Cognition/Sensorium: {gbcognition:22449} Insight: {Insight:22446} Judgment: {Insight:22446}  Structured Assessment Results: The Patient Health Questionnaire-9 (PHQ-9) is a  self-report measure that assesses symptoms and severity of depression over the course of the last two weeks. Melinda Ruiz obtained a score of *** suggesting {GBPHQ9SEVERITY:21752}. Melinda Ruiz finds the endorsed symptoms to be {gbphq9difficulty:21754}. Little interest or pleasure in doing things ***  Feeling down, depressed, or hopeless ***  Trouble falling or staying asleep, or sleeping too much ***  Feeling tired or having little energy ***  Poor appetite or overeating ***  Feeling bad about yourself --- or that you are a failure or have let yourself or your family down ***  Trouble concentrating on things, such as reading the newspaper or watching television ***  Moving or speaking so slowly that other people could have noticed? Or the opposite --- being so fidgety or restless that you have been moving around a lot more than usual ***  Thoughts that you would be better off dead or hurting yourself in some way ***  PHQ-9 Score ***    The Generalized Anxiety Disorder-7 (GAD-7) is a brief self-report measure that assesses symptoms of anxiety over the course of the last two weeks. Melinda Ruiz obtained a score of *** suggesting {gbgad7severity:21753}. Melinda Ruiz finds the endorsed symptoms to be {gbphq9difficulty:21754}. Feeling nervous, anxious, on edge ***  Not being able to stop or control worrying ***  Worrying too much about different things ***  Trouble relaxing ***  Being so restless that it's hard to sit still ***  Becoming easily annoyed or irritable ***  Feeling afraid as if something awful might happen ***  GAD-7 Score ***   Interventions:  {Interventions:22172}  DSM-5 Diagnosis: 296.31 (F33.0) Major Depressive Disorder, Recurrent Episode, Mild, With Anxious Distress  Treatment Goal & Progress: During the initial appointment with this provider, the following treatment goal was established: decrease emotional eating. Melinda Ruiz has demonstrated progress in her goal as evidenced by  {gbtxprogress:22839}. Melinda Ruiz also reported {gbtxprogress2:22951}.  Plan: Melinda Ruiz continues to appear able and willing to participate as evidenced by engagement in reciprocal conversation, and asking questions for clarification as appropriate. The  next appointment will be scheduled in {gbweeks:21758}, which will be via [employer]. The next session will focus on reviewing learned skills, and working towards the established treatment goal.***

## 2019-11-17 NOTE — Telephone Encounter (Signed)
I had released these results to the patient on mychart several days ago. She can see them there. If she has any questions, please make a telemedicine appointment with her to discuss the results.

## 2019-11-18 ENCOUNTER — Encounter (HOSPITAL_COMMUNITY): Payer: Self-pay

## 2019-11-18 ENCOUNTER — Other Ambulatory Visit: Payer: Self-pay

## 2019-11-18 ENCOUNTER — Ambulatory Visit (HOSPITAL_COMMUNITY)
Admission: EM | Admit: 2019-11-18 | Discharge: 2019-11-18 | Disposition: A | Discharge status: Home / Self Care | Payer: Medicare Other | Attending: Emergency Medicine | Admitting: Emergency Medicine

## 2019-11-18 DIAGNOSIS — R202 Paresthesia of skin: Secondary | ICD-10-CM

## 2019-11-18 DIAGNOSIS — G5602 Carpal tunnel syndrome, left upper limb: Secondary | ICD-10-CM

## 2019-11-18 MED ORDER — PREDNISONE 10 MG (21) PO TBPK: ORAL_TABLET | Freq: Every day | ORAL | 0 refills | Status: DC

## 2019-11-18 NOTE — ED Provider Notes (Signed)
[place]    CSN: [id number] Arrival date & time: 11/18/19  1635      History   Chief Complaint Chief Complaint  Patient presents with   Numbness    HPI Melinda Ruiz is a 38 y.o. female.   Melinda Ruiz presents with complaints of tingling numbness sensation to left arm and hand. This has been ongoing for at least a month. 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 History of carpal tunnel release but feels symptoms have returned. Symptoms to all 5 fingers. No new injury. Pain radiates up to left lateral neck. No neck injury. No arm injury. She has tried taking tylenol which has helped some. She has been following with her PCP for this, completed a nerve conduction study on 11/10. Was referred to sports medicine as well as neurology.   Last cervical spine MRI in 2015 with findings of :  "IMPRESSION: 1. Chiari 1 malformation.  No cervical spine syrinx. 2. Mild central narrowing of the thecal sac at C3-4 due to a small central disc protrusion. The degenerative disc disease at C4-5 and C5-6 does not cause overt impingement. 3. Low T1 signal in the cervical spine. Although possibly from red marrow reconversion, siderosis and fibrosis can have a similar Appearance."  Nerve conduction study 11/10 "Impression: This is a normal study of the left upper extremity.  In particular, there is no evidence of carpal tunnel syndrome, ulnar neuropathy, or cervical radiculopathy."    ROS per HPI, negative if not otherwise mentioned.      Past Medical History:  Diagnosis Date   Acid reflux    Anemia    during pregancy   Bipolar disorder (HCC)    Chiari I malformation (HCC)    Complication of anesthesia    "Oxygen level drops"   Constipation    Depression    Diabetes mellitus without complication (HCC)    Family history of adverse reaction to anesthesia    thinks Mom has problem with oxygen level dropping    Fibroid    Gallbladder problem    Headache(784.0)    Hypertension    takes meds daily   Kidney stones 2010   Lactose intolerance    Mental disorder    Bi poloar,  Schizohrenia- not taking meds.  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/2017   Diabetes mellitus type 2 in obese ([place]) 10/02/2015   Low HDL (under 40) 10/02/2015   OSA (obstructive sleep apnea) 10/02/2015   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   SOB (shortness of breath) 04/30/2015   Dysfunctional uterine bleeding 06/19/2014   Depo-Provera contraceptive status 06/19/2014   Morbid obesity with BMI of 50.0-59.9, adult ([place]) 06/08/2013   Hypertension, benign 06/08/2013   Menorrhagia 06/08/2013    Past Surgical History:  Procedure Laterality Date   BREATH TEK H PYLORI N/A 05/31/2015   Procedure: BREATH TEK Kandis Ban;  Surgeon: Greer Pickerel, MD;  Location: [facility];  Service:  General;  Laterality: N/A;   CARPAL TUNNEL RELEASE  02/03/2012   Procedure: CARPAL TUNNEL RELEASE;  Surgeon: Meredith Pel, MD;  Location: [facility];  Service: Orthopedics;  Laterality: Left;  left carpal tunnel release   CARPAL TUNNEL RELEASE  01/11/2013   Procedure: CARPAL TUNNEL RELEASE;  Surgeon: Meredith Pel, MD;  Location: [facility];  Service: Orthopedics;  Laterality: Right;  Hand Set, Small Retractors, Lead Hand   CESAREAN SECTION  2006, 2011   CHOLECYSTECTOMY  06/2011   cystectomy     GANGLION CYST EXCISION  02/03/2012   Procedure: REMOVAL GANGLION OF WRIST;  Surgeon: Meredith Pel, MD;  Location: [facility];  Service: Orthopedics;  Laterality: Left;  left dorsal ganglion cyst excision   LAPAROSCOPIC GASTRIC SLEEVE RESECTION N/A 10/02/2015   Procedure: LAPAROSCOPIC GASTRIC SLEEVE RESECTION;  Surgeon: Greer Pickerel, MD;  Location: [facility];  Service: General;  Laterality: N/A;   Lymp Glands Removed--under arm bil     Lympectomy     MULTIPLE TOOTH EXTRACTIONS  2003   and wisdom teeth   Pilonidial Cyst Removed  1990's   TUBAL LIGATION  2011   UPPER GI ENDOSCOPY  10/02/2015   Procedure: UPPER GI ENDOSCOPY;  Surgeon: Greer Pickerel, MD;  Location: [facility];  Service: General;;    OB History    Gravida  3   Para  2   Term  2   Preterm  0   AB  1   Living  2     SAB  1   TAB  0   Ectopic  0   Multiple  0   Live Births  2            Home Medications    Prior to Admission medications   Medication Sig Start Date End Date Taking? Authorizing Provider  acetaminophen (TYLENOL) 500 MG tablet Take 1,000 mg by mouth every 6 (six) hours as needed for moderate pain.    [provider]  diclofenac sodium (VOLTAREN) 1 % GEL Apply 2 g topically 4 (four) times daily. 07/26/19   Anderson, Chelsey L, DO  norethindrone (MICRONOR) 0.35 MG tablet Take 1 tablet (0.35 mg total) by mouth daily. 05/30/19   Lovenia Kim, MD  pantoprazole (PROTONIX) 40 MG tablet TAKE 1 TABLET BY MOUTH EVERY DAY 10/21/19   Doran Stabler, MD  predniSONE (STERAPRED UNI-PAK 21 TAB) 10 MG (21) TBPK tablet Take by mouth daily. Per box instruction 11/18/19   Augusto Gamble B, NP  sertraline (ZOLOFT) 25 MG tablet Take 1 tablet (25 mg total) by mouth daily. 01/17/19   Lovenia Kim, MD  sucralfate (CARAFATE) 1 g tablet TAKE 1 TABLET BY MOUTH EVERY 6 HOURS AS NEEDED FOR ULCERS. Can crush and mix with water or applesauce if needed. 02/25/16   [provider]  Vitamin D, Ergocalciferol, (DRISDOL) 1.25 MG (50000 UT) CAPS capsule Take 1 capsule (50,000 Units total) by mouth every 7  (seven) days. 09/08/19   Eber Jones, MD    Family History Family History  Problem Relation Age of Onset   Kidney cancer Father    Diabetes Mellitus II Father    Hypertension Father    Arthritis Mother    Diabetes Mellitus II Mother    Hypertension Mother    Diabetes Mother    Anxiety disorder Mother    Anesthesia problems Neg Hx     Social History Social History   Tobacco Use   Smoking status: Former  Smoker    Packs/day: 0.25    Types: Cigarettes    Quit date: 10/01/2009    Years since quitting: 10.1   Smokeless tobacco: Never Used  Substance Use Topics   Alcohol use: Yes    Comment: has a drink twice weekly   Drug use: No     Allergies   Ibuprofen, Penicillins, and Oxycodone-acetaminophen   Review of Systems Review of Systems   Physical Exam Triage Vital Signs ED Triage Vitals  Enc Vitals Group     BP 11/18/19 1726 134/89     Pulse Rate 11/18/19 1726 88     Resp 11/18/19 1726 15     Temp 11/18/19 1726 98.4 F (36.9 C)     Temp Source 11/18/19 1726 Oral     SpO2 11/18/19 1726 100 %     Weight --      Height --      Head Circumference --      Peak Flow --      Pain Score 11/18/19 1724 7     Pain Loc --      Pain Edu? --      Excl. in [place]? --    No data found.  Updated Vital Signs BP 134/89 (BP Location: Right Wrist)    Pulse 88    Temp 98.4 F (36.9 C) (Oral)    Resp 15    SpO2 100%    Physical Exam Constitutional:      General: She is not in acute distress.    Appearance: She is well-developed.  Cardiovascular:     Rate and Rhythm: Normal rate.  Pulmonary:     Effort: Pulmonary effort is normal.  Musculoskeletal:     Comments: Full ROM of left arm without apparent difficulty; raising of left arm with pain to left trapezius and neck musculature; gross sensation intact; strength equal bilaterally to upper extremities; patient with positive tinnel and phalen  Testing To left wrist and hand, patient experiences tingling  sensation to all 5 fingers, however  Skin:    General: Skin is warm and dry.  Neurological:     Mental Status: She is alert and oriented to person, place, and time.      UC Treatments / Results  Labs (all labs ordered are listed, but only abnormal results are displayed) Labs Reviewed - No data to display  EKG   Radiology No results found.  Procedures Procedures (including critical care time)  Medications Ordered in UC Medications - No data to display  Initial Impression / Assessment and Plan / UC Course  I have reviewed the triage vital signs and the nursing notes.  Pertinent labs & imaging results that were available during my care of the patient were reviewed by me and considered in my medical decision making (see chart for details).    No red flag findings. Symptoms do appear chronic in nature. No new injury. Wrist brace provided for left brace concerning for carpal tunnel, however, entire hand and much of arm with sensation as well. Normal nerve conduction study which is reassuring. Prednisone pack provided. Encouraged follow up with PCP and/or sports medicine for continued and long term management of symptoms. Patient verbalized understanding and agreeable to plan.   Final Clinical Impressions(s) / UC Diagnoses   Final diagnoses:  Paresthesias  Carpal tunnel syndrome of left wrist     Discharge Instructions     May use a brace to left wrist, especially while working/ driving.  Ice to  the wrist at the end of the day.  Course of prednisone as prescribed.  Please follow up with your primary care provider and/or sports medicine for recheck and further evaluation if symptoms persist.     ED Prescriptions    Medication Sig Dispense Auth. Provider   predniSONE (STERAPRED UNI-PAK 21 TAB) 10 MG (21) TBPK tablet Take by mouth daily. Per box instruction 21 tablet Zigmund Gottron, NP     PDMP not reviewed this encounter.   Zigmund Gottron, NP 11/19/19 1114

## 2019-11-18 NOTE — Discharge Instructions (Signed)
May use a brace to left wrist, especially while working/ driving.  Ice to the wrist at the end of the day.  Course of prednisone as prescribed.  Please follow up with your primary care provider and/or sports medicine for recheck and further evaluation if symptoms persist.

## 2019-11-18 NOTE — Telephone Encounter (Signed)
Attempted to call patient to inquire as to whether she has seen her results on My Chart or if she had additional questions.  There was no answer and no voice mail.  Ozella Almond, [place]

## 2019-11-18 NOTE — ED Triage Notes (Signed)
Patient presents to Urgent Care with complaints of numbness from the left side of her neck that radiates down to her left shoulder since a month ago. Patient reports she has a hx of pinched nerve as well as fluid buildup on her spine.

## 2019-11-18 NOTE — ED Notes (Signed)
Patient fitted with LEFT wrist splint per Rondel Oh NP

## 2019-11-21 ENCOUNTER — Ambulatory Visit (HOSPITAL_COMMUNITY)
Admission: EM | Admit: 2019-11-21 | Discharge: 2019-11-21 | Disposition: A | Discharge status: Home / Self Care | Payer: Medicare Other | Attending: Family Medicine | Admitting: Family Medicine

## 2019-11-21 ENCOUNTER — Encounter (HOSPITAL_COMMUNITY): Payer: Self-pay

## 2019-11-21 ENCOUNTER — Other Ambulatory Visit: Payer: Self-pay

## 2019-11-21 DIAGNOSIS — S46812A Strain of other muscles, fascia and tendons at shoulder and upper arm level, left arm, initial encounter: Secondary | ICD-10-CM | POA: Diagnosis not present

## 2019-11-21 MED ORDER — CYCLOBENZAPRINE HCL 5 MG PO TABS: 5.0000 mg | ORAL_TABLET | Freq: Two times a day (BID) | ORAL | 0 refills | Status: DC | PRN

## 2019-11-21 MED ORDER — DEXAMETHASONE SODIUM PHOSPHATE 10 MG/ML IJ SOLN
INTRAMUSCULAR | Status: AC
  Filled 2019-11-21: qty 1

## 2019-11-21 MED ORDER — DEXAMETHASONE SODIUM PHOSPHATE 10 MG/ML IJ SOLN
10.0000 mg | Freq: Once | INTRAMUSCULAR | Status: AC
  Administered 2019-11-21: 12:00:00 10 mg via INTRAMUSCULAR

## 2019-11-21 MED ORDER — PREDNISONE 10 MG (21) PO TBPK: ORAL_TABLET | Freq: Every day | ORAL | 0 refills | Status: DC

## 2019-11-21 NOTE — ED Triage Notes (Signed)
Pt states she has left shoulder and neck pain x 4 days. Pt states this pain came from out of nowhere.

## 2019-11-21 NOTE — Discharge Instructions (Signed)
We gave you a shot of Decadron today Please follow prednisone taper prescription and begin taking it for the next 6 days.  Begin with 6 tablets, decrease by 1 tablet each daily until completion.  Take with food. You may use flexeril as needed to help with pain. This is a muscle relaxer and causes sedation- please use only at bedtime or when you will be home and not have to drive/work Gentle stretching  Follow up if not improving

## 2019-11-21 NOTE — ED Provider Notes (Signed)
[place]    CSN: [id number] Arrival date & time: 11/21/19  1020      History   Chief Complaint Chief Complaint  Patient presents with  . Shoulder Pain  . Neck Pain    HPI Melinda Ruiz is a 38 y.o. female history of hypertension, DM type II, gastric sleeve, presenting today for evaluation of left shoulder and neck pain.  Patient states that for the past 4 days she has had pain in her left neck extending into her left shoulder.  She was seen here approximately 4 days ago for her carpal tunnel release and was provided wrist brace which has been helping pain at rest.  She has been unable to fill the prednisone as she has not received a call from pharmacy.  She states that the pain in her neck and shoulder have persisted.  She has been using Tylenol.  She avoids NSAIDs due to gastric sleeve.  Denies any fall, injury or increase in activity.  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  HPI  Past Medical History:  Diagnosis Date  . Acid reflux   . Anemia    during pregancy  . Bipolar disorder ([place])   . Chiari I malformation ([place])   . Complication of anesthesia    "Oxygen level drops"  . Constipation   . Depression   . Diabetes mellitus without complication ([place])   . Family history of adverse reaction to anesthesia    thinks Mom has problem with oxygen level dropping  . Fibroid   . Gallbladder problem   . Headache(784.0)   . Hypertension    takes meds daily  . Kidney stones 2010  . Lactose intolerance   . Mental disorder    Bi poloar,  Schizohrenia- not taking meds.  Has been seen at [facility]  . Morbid obesity with BMI of 50.0-59.9, adult ([place])   . Osteoarthritis   . Schizophrenia ([place])   . Sleep apnea   . Ulcer of abdomen wall [facility])     Patient Active Problem List   Diagnosis Date Noted  . Carpal tunnel syndrome of left wrist 09/02/2019  . Depression 01/17/2019  . Stiffness of  joint, lower leg 10/11/2018  . History of tubal ligation 08/18/2018  . Chronic migraine 07/30/2017  . Chiari malformation type I ([place]) 07/30/2017  . Diabetes mellitus type 2 in obese ([place]) 10/02/2015  . Low HDL (under 40) 10/02/2015  . OSA (obstructive sleep apnea) 10/02/2015  . 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 SOB (shortness of breath) 04/30/2015  . Dysfunctional uterine bleeding 06/19/2014  . Depo-Provera contraceptive status 06/19/2014  . Morbid obesity with BMI of 50.0-59.9, adult ([place]) 06/08/2013  . Hypertension, benign 06/08/2013  . Menorrhagia 06/08/2013    Past Surgical History:  Procedure Laterality Date  . BREATH TEK H PYLORI N/A 05/31/2015   Procedure: BREATH TEK H PYLORI;  Surgeon: Greer Pickerel, MD;  Location: [facility];  Service: General;  Laterality: N/A;  . CARPAL TUNNEL RELEASE  02/03/2012   Procedure: CARPAL TUNNEL RELEASE;  Surgeon: Meredith Pel, MD;  Location: [place];  Service: Orthopedics;  Laterality: Left;  left carpal tunnel release  . CARPAL TUNNEL RELEASE  01/11/2013   Procedure: CARPAL TUNNEL RELEASE;  Surgeon: Meredith Pel, MD;  Location: [place];  Service: Orthopedics;  Laterality: Right;  Hand Set, Small Retractors, Lead Hand  . CESAREAN SECTION  2006, 2011  . CHOLECYSTECTOMY  06/2011  . cystectomy    . GANGLION CYST EXCISION  02/03/2012   Procedure: REMOVAL GANGLION OF WRIST;  Surgeon: Meredith Pel, MD;  Location: [place];  Service: Orthopedics;  Laterality: Left;  left dorsal ganglion cyst excision  . LAPAROSCOPIC GASTRIC SLEEVE RESECTION N/A 10/02/2015   Procedure: LAPAROSCOPIC GASTRIC SLEEVE RESECTION;  Surgeon: Greer Pickerel, MD;  Location: [facility];  Service: General;  Laterality: N/A;  . Lymp Glands Removed--under arm bil    . Lympectomy    . MULTIPLE TOOTH EXTRACTIONS  2003   and wisdom teeth  . Pilonidial Cyst Removed  1990's  . TUBAL LIGATION  2011  . UPPER GI ENDOSCOPY  10/02/2015   Procedure: UPPER GI ENDOSCOPY;   Surgeon: Greer Pickerel, MD;  Location: [facility];  Service: General;;    OB History    Gravida  3   Para  2   Term  2   Preterm  0   AB  1   Living  2     SAB  1   TAB  0   Ectopic  0   Multiple  0   Live Births  2            Home Medications    Prior to Admission medications   Medication Sig Start Date End Date Taking? Authorizing Provider  acetaminophen (TYLENOL) 500 MG tablet Take 1,000 mg by mouth every 6 (six) hours as needed for moderate pain.    [provider]  cyclobenzaprine (FLEXERIL) 5 MG tablet Take 1-2 tablets (5-10 mg total) by mouth 2 (two) times daily as needed for muscle spasms. 11/21/19   Wieters, Hallie C, PA-C  diclofenac sodium (VOLTAREN) 1 % GEL Apply 2 g topically 4 (four) times daily. 07/26/19   Anderson, Chelsey L, DO  norethindrone (MICRONOR) 0.35 MG tablet Take 1 tablet (0.35 mg total) by mouth daily. 05/30/19   Lovenia Kim, MD  pantoprazole (PROTONIX) 40 MG tablet TAKE 1 TABLET BY MOUTH EVERY DAY 10/21/19   Doran Stabler, MD  predniSONE (STERAPRED UNI-PAK 21 TAB) 10 MG (21) TBPK tablet Take by mouth daily. Per box instruction 11/21/19   Wieters, Hallie C, PA-C  sertraline (ZOLOFT) 25 MG tablet Take 1 tablet (25 mg total) by mouth daily. 01/17/19   Lovenia Kim, MD  sucralfate (CARAFATE) 1 g tablet TAKE 1 TABLET BY MOUTH EVERY 6 HOURS AS NEEDED FOR ULCERS. Can crush and mix with water or applesauce if needed. 02/25/16   [provider]  Vitamin D, Ergocalciferol, (DRISDOL) 1.25 MG (50000 UT) CAPS capsule Take 1 capsule (50,000 Units total) by mouth every 7 (seven) days. 09/08/19   Eber Jones, MD    Family History Family History  Problem Relation Age of Onset  . Kidney cancer Father   . Diabetes Mellitus II Father   . Hypertension Father   . Arthritis Mother   . Diabetes Mellitus II Mother   . Hypertension Mother   . Diabetes Mother   . Anxiety disorder Mother   . Anesthesia problems Neg Hx     Social  History Social History   Tobacco Use  . Smoking status: Former Smoker    Packs/day: 0.25    Types: Cigarettes    Quit date: 10/01/2009    Years since quitting: 10.1  . Smokeless tobacco: Never Used  Substance Use Topics  . Alcohol use: Yes    Comment: has a drink twice weekly  .  Drug use: No     Allergies   Ibuprofen, Penicillins, and Oxycodone-acetaminophen   Review of Systems Review of Systems  Constitutional: Negative for fatigue and fever.  Eyes: Negative for visual disturbance.  Respiratory: Negative for shortness of breath.   Cardiovascular: Negative for chest pain.  Gastrointestinal: Negative for abdominal pain, nausea and vomiting.  Musculoskeletal: Positive for myalgias and neck pain. Negative for arthralgias and joint swelling.  Skin: Negative for color change, rash and wound.  Neurological: Negative for dizziness, weakness, light-headedness and headaches.     Physical Exam Triage Vital Signs ED Triage Vitals  Enc Vitals Group     BP 11/21/19 1109 130/90     Pulse Rate 11/21/19 1109 88     Resp 11/21/19 1109 18     Temp 11/21/19 1109 98.4 F (36.9 C)     Temp Source 11/21/19 1109 Oral     SpO2 11/21/19 1109 100 %     Weight 11/21/19 1108 287 lb (130.2 kg)     Height --      Head Circumference --      Peak Flow --      Pain Score 11/21/19 1107 5     Pain Loc --      Pain Edu? --      Excl. in [place]? --    No data found.  Updated Vital Signs BP 130/90 (BP Location: Right Arm)   Pulse 88   Temp 98.4 F (36.9 C) (Oral)   Resp 18   Wt 287 lb (130.2 kg)   LMP 10/22/2019   SpO2 100%   BMI 56.05 kg/m   Visual Acuity Right Eye Distance:   Left Eye Distance:   Bilateral Distance:    Right Eye Near:   Left Eye Near:    Bilateral Near:     Physical Exam Vitals signs and nursing note reviewed.  Constitutional:      Appearance: She is well-developed.     Comments: No acute distress  HENT:     Head: Normocephalic and atraumatic.     Nose:  Nose normal.  Eyes:     Conjunctiva/sclera: Conjunctivae normal.  Neck:     Musculoskeletal: Neck supple.  Cardiovascular:     Rate and Rhythm: Normal rate.  Pulmonary:     Effort: Pulmonary effort is normal. No respiratory distress.  Abdominal:     General: There is no distension.  Musculoskeletal: Normal range of motion.     Comments: Nontender to palpation along cervical thoracic spine midline, increased tenderness throughout left cervical and superior trapezius musculature extending towards shoulder, mild superior pectoralis tenderness, limited active range of motion beyond 90 degrees abduction.   Full active range of motion of neck does elicit pain with rightward rotation  Splint present on left wrist  Skin:    General: Skin is warm and dry.  Neurological:     Mental Status: She is alert and oriented to person, place, and time.      UC Treatments / Results  Labs (all labs ordered are listed, but only abnormal results are displayed) Labs Reviewed - No data to display  EKG   Radiology No results found.  Procedures Procedures (including critical care time)  Medications Ordered in UC Medications  dexamethasone (DECADRON) injection 10 mg (10 mg Intramuscular Given 11/21/19 1151)  dexamethasone (DECADRON) 10 MG/ML injection (has no administration in time range)  dexamethasone (DECADRON) 10 MG/ML injection (has no administration in time range)    Initial Impression /  Assessment and Plan / UC Course  I have reviewed the triage vital signs and the nursing notes.  Pertinent labs & imaging results that were available during my care of the patient were reviewed by me and considered in my medical decision making (see chart for details).     Likely trapezius/cervical strain on left side, no mechanism of injury, do not suspect acute bony abnormality.  Avoiding NSAIDs, will provide Decadron today in clinic, will have fill prescription for prednisone previously prescribed,  will add in Flexeril.  Gentle range of motion exercises, ice and heat.Discussed strict return precautions. Patient verbalized understanding and is agreeable with plan.  Final Clinical Impressions(s) / UC Diagnoses   Final diagnoses:  Strain of left trapezius muscle, initial encounter     Discharge Instructions     We gave you a shot of Decadron today Please follow prednisone taper prescription and begin taking it for the next 6 days.  Begin with 6 tablets, decrease by 1 tablet each daily until completion.  Take with food. You may use flexeril as needed to help with pain. This is a muscle relaxer and causes sedation- please use only at bedtime or when you will be home and not have to drive/work Gentle stretching  Follow up if not improving     ED Prescriptions    Medication Sig Dispense Auth. Provider   predniSONE (STERAPRED UNI-PAK 21 TAB) 10 MG (21) TBPK tablet Take by mouth daily. Per box instruction 21 tablet Wieters, Hallie C, PA-C   cyclobenzaprine (FLEXERIL) 5 MG tablet Take 1-2 tablets (5-10 mg total) by mouth 2 (two) times daily as needed for muscle spasms. 24 tablet Wieters, Lesslie C, PA-C     PDMP not reviewed this encounter.   Janith Lima, PA-C 11/21/19 1306

## 2019-11-28 ENCOUNTER — Ambulatory Visit (INDEPENDENT_AMBULATORY_CARE_PROVIDER_SITE_OTHER): Payer: Medicare Other | Admitting: Psychology

## 2019-11-28 ENCOUNTER — Telehealth (INDEPENDENT_AMBULATORY_CARE_PROVIDER_SITE_OTHER): Payer: Self-pay | Admitting: Psychology

## 2019-11-28 ENCOUNTER — Encounter (INDEPENDENT_AMBULATORY_CARE_PROVIDER_SITE_OTHER): Payer: Self-pay

## 2019-11-28 NOTE — Telephone Encounter (Signed)
  Office: [phone]  /  Fax: [phone]  Date of Call: .fate  Time of Call: 9:02am Provider: Glennie Isle, PsyD  CONTENT: This provider called Melinda Ruiz to check-in as she did not present for today's WebEx appointment at 9:00am. A HIPAA compliant voicemail was left requesting a call back. Of note, this provider stayed on the WebEx appointment for 5 minutes prior to signing off per the clinic's grace period policy.    PLAN: This provider will wait for Melinda Ruiz to call back. No further follow-up planned by this provider.

## 2019-11-30 ENCOUNTER — Ambulatory Visit: Payer: Medicare Other | Admitting: Gastroenterology

## 2019-12-05 ENCOUNTER — Telehealth (INDEPENDENT_AMBULATORY_CARE_PROVIDER_SITE_OTHER): Payer: Self-pay | Admitting: Psychology

## 2019-12-05 ENCOUNTER — Other Ambulatory Visit (INDEPENDENT_AMBULATORY_CARE_PROVIDER_SITE_OTHER): Payer: Self-pay | Admitting: Family Medicine

## 2019-12-05 ENCOUNTER — Encounter (INDEPENDENT_AMBULATORY_CARE_PROVIDER_SITE_OTHER): Payer: Self-pay

## 2019-12-05 ENCOUNTER — Ambulatory Visit (INDEPENDENT_AMBULATORY_CARE_PROVIDER_SITE_OTHER): Payer: Medicare Other | Admitting: Family Medicine

## 2019-12-05 ENCOUNTER — Encounter (INDEPENDENT_AMBULATORY_CARE_PROVIDER_SITE_OTHER): Payer: Self-pay | Admitting: Family Medicine

## 2019-12-05 ENCOUNTER — Ambulatory Visit (INDEPENDENT_AMBULATORY_CARE_PROVIDER_SITE_OTHER): Payer: Medicare Other | Admitting: Psychology

## 2019-12-05 ENCOUNTER — Other Ambulatory Visit: Payer: Self-pay

## 2019-12-05 VITALS — BP 124/79 | HR 101 | Temp 98.0°F | Ht 62.0 in | Wt 288.0 lb

## 2019-12-05 DIAGNOSIS — R7303 Prediabetes: Secondary | ICD-10-CM | POA: Diagnosis not present

## 2019-12-05 DIAGNOSIS — Z6841 Body Mass Index (BMI) 40.0 and over, adult: Secondary | ICD-10-CM

## 2019-12-05 DIAGNOSIS — E559 Vitamin D deficiency, unspecified: Secondary | ICD-10-CM

## 2019-12-05 DIAGNOSIS — G4710 Hypersomnia, unspecified: Secondary | ICD-10-CM

## 2019-12-05 MED ORDER — VITAMIN D (ERGOCALCIFEROL) 1.25 MG (50000 UNIT) PO CAPS: 50000.0000 [IU] | ORAL_CAPSULE | ORAL | 0 refills | Status: DC

## 2019-12-05 NOTE — Telephone Encounter (Signed)
  Office: [phone]  /  Fax: [phone]  Date of Call: December 05, 2019  Time of Call: 12:02pm Provider: Glennie Isle, PsyD  CONTENT: This provider called [place] to check-in as she did not present for today's WebEx appointment at 12:00pm. A HIPAA compliant voicemail was left requesting a call back. Of note, this provider stayed on the WebEx appointment for 5 minutes prior to signing off per the clinic's grace period policy.    PLAN: This provider will wait for Nereyda to call back. No further follow-up planned by this provider.

## 2019-12-05 NOTE — Progress Notes (Unsigned)
Office: [phone]  /  Fax: [phone]    Date: December 05, 2019    Appointment Start Time: *** Duration: *** minutes Provider: Glennie Isle, Psy.D. Type of Session: Individual Therapy  Location of Patient: {gbptloc:23249} Location of Provider: {Location of Service:22491} Type of Contact: Telepsychological Visit via {gbtelepsych:23399}  Session Content: Melinda Ruiz is a 38 y.o. female presenting via {gbtelepsych:23399} for a follow-up appointment to address the previously established treatment goal of decreasing emotional eating. Today's appointment was a telepsychological visit due to COVID-19. Melinda Ruiz provided verbal consent for today's telepsychological appointment and she is aware she is responsible for securing confidentiality on her end of the session. Prior to proceeding with today's appointment, Melinda Ruiz's physical location at the time of this appointment was obtained as well a phone number she could be reached at in the event of technical difficulties. Melinda Ruiz and this provider participated in today's telepsychological service.   This provider conducted a brief check-in and verbally administered the PHQ-9 and GAD-7. *** Melinda Ruiz was receptive to today's appointment as evidenced by openness to sharing, responsiveness to feedback, and {gbreceptiveness:23401}.  Mental Status Examination:  Appearance: {Appearance:22431} Behavior: {Behavior:22445} Mood: {gbmood:21757} Affect: {Affect:22436} Speech: {Speech:22432} Eye Contact: {Eye Contact:22433} Psychomotor Activity: {Motor Activity:22434} Gait: {gbgait:23404} Thought Process: {thought process:22448}  Thought Content/Perception: {disturbances:22451} Orientation: {Orientation:22437} Memory/Concentration: {gbcognition:22449} Insight/Judgment: {Insight:22446}  Structured Assessments Results: The Patient Health Questionnaire-9 (PHQ-9) is a self-report measure that assesses symptoms and severity of depression over the course of the  last two weeks. Melinda Ruiz obtained a score of *** suggesting {GBPHQ9SEVERITY:21752}. Melinda Ruiz finds the endorsed symptoms to be {gbphq9difficulty:21754}. [0= Not at all; 1= Several days; 2= More than half the days; 3= Nearly every day] Little interest or pleasure in doing things ***  Feeling down, depressed, or hopeless ***  Trouble falling or staying asleep, or sleeping too much ***  Feeling tired or having little energy ***  Poor appetite or overeating ***  Feeling bad about yourself --- or that you are a failure or have let yourself or your family down ***  Trouble concentrating on things, such as reading the newspaper or watching television ***  Moving or speaking so slowly that other people could have noticed? Or the opposite --- being so fidgety or restless that you have been moving around a lot more than usual ***  Thoughts that you would be better off dead or hurting yourself in some way ***  PHQ-9 Score ***    The Generalized Anxiety Disorder-7 (GAD-7) is a brief self-report measure that assesses symptoms of anxiety over the course of the last two weeks. Melinda Ruiz obtained a score of *** suggesting {gbgad7severity:21753}. Melinda Ruiz finds the endorsed symptoms to be {gbphq9difficulty:21754}. [0= Not at all; 1= Several days; 2= Over half the days; 3= Nearly every day] Feeling nervous, anxious, on edge ***  Not being able to stop or control worrying ***  Worrying too much about different things ***  Trouble relaxing ***  Being so restless that it's hard to sit still ***  Becoming easily annoyed or irritable ***  Feeling afraid as if something awful might happen ***  GAD-7 Score ***   Interventions:  {Interventions for Progress Notes:23405}  DSM-5 Diagnosis: 296.31 (F33.0) Major Depressive Disorder, Recurrent Episode, Mild, With Anxious Distress  Treatment Goal & Progress: During the initial appointment with this provider, the following treatment goal was established: decrease emotional  eating. Melinda Ruiz has demonstrated progress in her goal as evidenced by {gbtxprogress:22839}. Melinda Ruiz also {gbtxprogress2:22951}.  Plan: The next appointment will be scheduled in {  gbweeks:21758}, which will be {gbtxmodality:23402}. The next session will focus on {Plan for Next Appointment:23400}.

## 2019-12-06 NOTE — Progress Notes (Signed)
Office: [phone]  /  Fax: [phone]   HPI:   Chief Complaint: OBESITY Melinda Ruiz is here to discuss her progress with her obesity treatment plan. She is on the Category 2 plan and is following her eating plan approximately 50 % of the time. She states she is walking for 5-10 minutes. Melinda Ruiz has not been here since 08/2019. She states her work schedule has changed. 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 She has a 37 year old daughter and a 55 year old son. She has a history of gastric sleeve in 2016 with Dr. [name]. She is working on getting revision bypass due to reflux.  Her weight is 288 lb (130.6 kg) today and has gained 5 lbs since her last visit. She has lost 0 lbs since starting treatment with [place].  Vitamin D Deficiency Melinda Ruiz has a diagnosis of vitamin D deficiency. She is currently taking prescription Vit D and denies nausea, vomiting or muscle weakness.  Hypersomnia Melinda Ruiz has hypersomnia. Her sleep study was negative. She states she is sleeping 4-5 hours per night. She has increased malabsorption risk from bariatric surgery.  Pre-Diabetes Melinda Ruiz has a diagnosis of pre-diabetes based on her elevated Hgb A1c and was informed this puts her at greater risk of developing diabetes. She is stable and continues to work on diet and exercise to decrease risk of diabetes.  ASSESSMENT AND PLAN:  Vitamin D deficiency - Plan: Vitamin D, Ergocalciferol, (DRISDOL) 1.25 MG (50000 UT) CAPS capsule, Vitamin D (25 hydroxy)  Prediabetes - Plan: CBC w/Diff/Platelet, Comprehensive Metabolic Panel (CMET), HgB A1c, Insulin, random, Lipid Panel With LDL/HDL Ratio, Anemia panel  Hypersomnia  Class 3 severe obesity with serious comorbidity and body mass index (BMI) of 50.0 to 59.9 in adult, unspecified obesity type (HCC)  PLAN:  Vitamin D Deficiency Low vitamin D level contributes to fatigue and are associated with obesity, breast, and colon cancer. Thyra agrees to continue  taking prescription Vit D 50,000 IU every week #4 and we will refill for 1 month. She will follow up for routine testing of vitamin D, at least 2-3 times per year to avoid over-replacement. We will check labs today. Natallia agrees to follow up with our clinic in 2 weeks.  Hypersomnia We discussed the importance of diet and multivitamins.   Pre-Diabetes Melinda Ruiz will continue to work on weight loss, exercise, and decreasing simple carbohydrates to help decrease the risk of diabetes. We will check labs today. Melinda Ruiz is to consider medications in the future.  Obesity Melinda Ruiz is currently in the action stage of change. As such, her goal is to continue with weight loss efforts She has agreed to follow the Category 2 plan Melinda Ruiz has been instructed to work up to a goal of 150 minutes of combined cardio and strengthening exercise per week for weight loss and overall health benefits.  We discussed the following Behavioral Modification Strategies today: increasing lean protein intake, decreasing simple carbohydrates, increasing vegetables, increase H20 intake, holiday eating strategies  and decrease liquid calories  Melinda Ruiz has agreed to follow up with our clinic in 2 weeks. She was informed of the importance of frequent follow up visits to maximize her success with intensive lifestyle modifications for her multiple health conditions.  ALLERGIES: Allergies  Allergen Reactions  . Ibuprofen Itching  . Penicillins Hives    .Has patient had a PCN reaction causing immediate rash, facial/tongue/throat swelling, SOB or lightheadedness with hypotension: No Has patient had a PCN reaction causing severe rash  involving mucus membranes or skin necrosis: No Has patient had a PCN reaction that required hospitalization No Has patient had a PCN reaction occurring within the last 10 years: No If all of the above answers are "NO", then may proceed with Cephalosporin use.  Melinda Ruiz Oxycodone-Acetaminophen Itching     MEDICATIONS: Current Outpatient Medications on File Prior to Visit  Medication Sig Dispense Refill  . acetaminophen (TYLENOL) 500 MG tablet Take 1,000 mg by mouth every 6 (six) hours as needed for moderate pain.    . cyclobenzaprine (FLEXERIL) 5 MG tablet Take 1-2 tablets (5-10 mg total) by mouth 2 (two) times daily as needed for muscle spasms. 24 tablet 0  . diclofenac sodium (VOLTAREN) 1 % GEL Apply 2 g topically 4 (four) times daily. 150 g 0  . norethindrone (MICRONOR) 0.35 MG tablet Take 1 tablet (0.35 mg total) by mouth daily. 1 Package 11  . pantoprazole (PROTONIX) 40 MG tablet TAKE 1 TABLET BY MOUTH EVERY DAY 90 tablet 0  . predniSONE (STERAPRED UNI-PAK 21 TAB) 10 MG (21) TBPK tablet Take by mouth daily. Per box instruction 21 tablet 0  . sertraline (ZOLOFT) 25 MG tablet Take 1 tablet (25 mg total) by mouth daily. 60 tablet 0  . sucralfate (CARAFATE) 1 g tablet TAKE 1 TABLET BY MOUTH EVERY 6 HOURS AS NEEDED FOR ULCERS. Can crush and mix with water or applesauce if needed.     No current facility-administered medications on file prior to visit.     PAST MEDICAL HISTORY: Past Medical History:  Diagnosis Date  . Acid reflux   . Anemia    during pregancy  . Bipolar disorder ([place])   . Chiari I malformation ([place])   . Complication of anesthesia    "Oxygen level drops"  . Constipation   . Depression   . Diabetes mellitus without complication ([place])   . Family history of adverse reaction to anesthesia    thinks Mom has problem with oxygen level dropping  . Fibroid   . Gallbladder problem   . Headache(784.0)   . Hypertension    takes meds daily  . Kidney stones 2010  . Lactose intolerance   . Mental disorder    Bi poloar,  Schizohrenia- not taking meds.  Has been [facility] at [facility]  . Morbid obesity with BMI of 50.0-59.9, adult ([place])   . Osteoarthritis   . Schizophrenia ([place])   . Sleep apnea   . Ulcer of abdomen wall ([place])     PAST SURGICAL HISTORY: Past Surgical  History:  Procedure Laterality Date  . BREATH TEK H PYLORI N/A 05/31/2015   Procedure: BREATH TEK H PYLORI;  Surgeon: Greer Pickerel, MD;  Location: [facility];  Service: General;  Laterality: N/A;  . CARPAL TUNNEL RELEASE  02/03/2012   Procedure: CARPAL TUNNEL RELEASE;  Surgeon: Meredith Pel, MD;  Location: [facility];  Service: Orthopedics;  Laterality: Left;  left carpal tunnel release  . CARPAL TUNNEL RELEASE  01/11/2013   Procedure: CARPAL TUNNEL RELEASE;  Surgeon: Meredith Pel, MD;  Location: [facility];  Service: Orthopedics;  Laterality: Right;  Hand Set, Small Retractors, Lead Hand  . CESAREAN SECTION  2006, 2011  . CHOLECYSTECTOMY  06/2011  . cystectomy    . GANGLION CYST EXCISION  02/03/2012   Procedure: REMOVAL GANGLION OF WRIST;  Surgeon: Meredith Pel, MD;  Location: [facility];  Service: Orthopedics;  Laterality: Left;  left dorsal ganglion cyst excision  . LAPAROSCOPIC GASTRIC SLEEVE RESECTION N/A  10/02/2015   Procedure: LAPAROSCOPIC GASTRIC SLEEVE RESECTION;  Surgeon: Greer Pickerel, MD;  Location: [facility];  Service: General;  Laterality: N/A;  . Lymp Glands Removed--under arm bil    . Lympectomy    . MULTIPLE TOOTH EXTRACTIONS  2003   and wisdom teeth  . Pilonidial Cyst Removed  1990's  . TUBAL LIGATION  2011  . UPPER GI ENDOSCOPY  10/02/2015   Procedure: UPPER GI ENDOSCOPY;  Surgeon: Greer Pickerel, MD;  Location: [facility];  Service: General;;    SOCIAL HISTORY: Social History   Tobacco Use  . Smoking status: Former Smoker    Packs/day: 0.25    Types: Cigarettes    Quit date: 10/01/2009    Years since quitting: 10.1  . Smokeless tobacco: Never Used  Substance Use Topics  . Alcohol use: Yes    Comment: has a drink twice weekly  . Drug use: No    FAMILY HISTORY: Family History  Problem Relation Age of Onset  . Kidney cancer Father   . Diabetes Mellitus II Father   . Hypertension Father   . Arthritis Mother   . Diabetes Mellitus II Mother   . Hypertension Mother   .  Diabetes Mother   . Anxiety disorder Mother   . Anesthesia problems Neg Hx     ROS: Review of Systems  Constitutional: Negative for weight loss.  Gastrointestinal: Negative for nausea and vomiting.  Musculoskeletal:       Negative muscle weakness    PHYSICAL EXAM: Blood pressure 124/79, pulse (!) 101, temperature 98 F (36.7 C), temperature source Oral, height 5\' 2"  (1.575 m), weight 288 lb (130.6 kg), last menstrual period 11/19/2019, SpO2 98 %. Body mass index is 52.68 kg/m.   Physical Exam Vitals signs reviewed.  Constitutional:      Appearance: Normal appearance. She is obese.  Cardiovascular:     Rate and Rhythm: Normal rate.     Pulses: Normal pulses.  Pulmonary:     Effort: Pulmonary effort is normal.     Breath sounds: Normal breath sounds.  Musculoskeletal: Normal range of motion.  Skin:    General: Skin is warm and dry.  Neurological:     Mental Status: She is alert and oriented to person, place, and time.  Psychiatric:        Mood and Affect: Mood normal.        Behavior: Behavior normal.     RECENT LABS AND TESTS: BMET    Component Value Date/Time   NA 142 12/05/2019 1247   K 4.4 12/05/2019 1247   CL 105 12/05/2019 1247   CO2 24 12/05/2019 1247   GLUCOSE 156 (H) 12/05/2019 1247   GLUCOSE 64 (L) 10/02/2017 1753   BUN 8 12/05/2019 1247   CREATININE 0.76 12/05/2019 1247   CALCIUM 9.1 12/05/2019 1247   GFRNONAA 101 12/05/2019 1247   GFRAA 116 12/05/2019 1247   Lab Results  Component Value Date   HGBA1C 5.7 (H) 12/05/2019   HGBA1C 5.7 (H) 08/10/2019   HGBA1C 5.5 08/17/2018   HGBA1C 5.6 04/22/2018   Lab Results  Component Value Date   INSULIN WILL FOLLOW 12/05/2019   INSULIN 27.0 (H) 08/10/2019   CBC    Component Value Date/Time   WBC 9.7 12/05/2019 1247   WBC 9.3 10/02/2017 1753   RBC 3.43 (L) 12/05/2019 1247   RBC 4.54 10/02/2017 1753   HGB 9.5 (L) 12/05/2019 1247   HCT 29.5 (L) 12/05/2019 1247   PLT 460 (H) 12/05/2019  1247   MCV  86 12/05/2019 1247   MCH 27.7 12/05/2019 1247   MCH 28.6 10/02/2017 1753   MCHC 32.2 12/05/2019 1247   MCHC 33.2 10/02/2017 1753   RDW 14.8 12/05/2019 1247   LYMPHSABS 3.2 (H) 12/05/2019 1247   MONOABS 1.0 10/04/2015 0547   EOSABS 0.1 12/05/2019 1247   BASOSABS 0.0 12/05/2019 1247   Iron/TIBC/Ferritin/ %Sat    Component Value Date/Time   IRON 48 12/05/2019 1246   TIBC 328 12/05/2019 1246   FERRITIN 22 12/05/2019 1246   IRONPCTSAT 15 12/05/2019 1246   Lipid Panel     Component Value Date/Time   CHOL 136 12/05/2019 1246   TRIG 126 12/05/2019 1246   HDL 43 12/05/2019 1246   CHOLHDL 3.0 08/17/2018 1343   LDLCALC 71 12/05/2019 1246   Hepatic Function Panel     Component Value Date/Time   PROT 6.6 12/05/2019 1247   ALBUMIN 3.8 12/05/2019 1247   AST 8 12/05/2019 1247   ALT 10 12/05/2019 1247   ALKPHOS 90 12/05/2019 1247   BILITOT 0.2 12/05/2019 1247      Component Value Date/Time   TSH 1.690 08/10/2019 1532      OBESITY BEHAVIORAL INTERVENTION VISIT  Today's visit was # 4   Starting weight: 283 lbs Starting date: 08/10/2019 Today's weight : 288 lbs Today's date: 12/05/2019 Total lbs lost to date: 0 At least 15 minutes were spent on discussing the following behavioral intervention visit.   ASK: We discussed the diagnosis of obesity with [name] today and Anthonella agreed to give [place] permission to discuss obesity behavioral modification therapy today.  ASSESS: Mikeshia has the diagnosis of obesity and her BMI today is 52.66 Deshunda is in the action stage of change   ADVISE: Teslyn was educated on the multiple health risks of obesity as well as the benefit of weight loss to improve her health. She was advised of the need for long term treatment and the importance of lifestyle modifications to improve her current health and to decrease her risk of future health problems.  AGREE: Multiple dietary modification options and treatment options were discussed  and  Kelvin agreed to follow the recommendations documented in the above note.  ARRANGE: Jessicaann was educated on the importance of frequent visits to treat obesity as outlined per CMS and USPSTF guidelines and agreed to schedule her next follow up appointment today.  Wilhemena Durie, am acting as transcriptionist for Briscoe Deutscher, DO  I have reviewed the above documentation for accuracy and completeness, and I agree with the above. Briscoe Deutscher, DO

## 2019-12-07 ENCOUNTER — Encounter (INDEPENDENT_AMBULATORY_CARE_PROVIDER_SITE_OTHER): Payer: Self-pay | Admitting: Family Medicine

## 2019-12-07 LAB — ANEMIA PANEL
Ferritin: 22 ng/mL (ref 15–150)
Folate, Hemolysate: 272 ng/mL
Folate, RBC: 600 ng/mL (ref >498)
Hematocrit: 45.3 % (ref 34.0–46.6)
Iron Saturation: 15 % (ref 15–55)
Iron: 48 ug/dL (ref 27–159)
Retic Ct Pct: 1.3 % (ref 0.6–2.6)
Total Iron Binding Capacity: 328 ug/dL (ref 250–450)
UIBC: 280 ug/dL (ref 131–425)
Vitamin B-12: 409 pg/mL (ref 232–1245)

## 2019-12-07 LAB — LIPID PANEL WITH LDL/HDL RATIO
Cholesterol, Total: 136 mg/dL (ref 100–199)
HDL: 43 mg/dL (ref >39)
LDL Chol Calc (NIH): 71 mg/dL (ref 0–99)
LDL/HDL Ratio: 1.7 ratio (ref 0.0–3.2)
Triglycerides: 126 mg/dL (ref 0–149)
VLDL Cholesterol Cal: 22 mg/dL (ref 5–40)

## 2019-12-08 LAB — COMPREHENSIVE METABOLIC PANEL
ALT: 10 IU/L (ref 0–32)
AST: 8 IU/L (ref 0–40)
Albumin/Globulin Ratio: 1.4 (ref 1.2–2.2)
Albumin: 3.8 g/dL (ref 3.8–4.8)
Alkaline Phosphatase: 90 IU/L (ref 39–117)
BUN/Creatinine Ratio: 11 (ref 9–23)
BUN: 8 mg/dL (ref 6–20)
Bilirubin Total: 0.2 mg/dL (ref 0.0–1.2)
CO2: 24 mmol/L (ref 20–29)
Calcium: 9.1 mg/dL (ref 8.7–10.2)
Chloride: 105 mmol/L (ref 96–106)
Creatinine, Ser: 0.76 mg/dL (ref 0.57–1.00)
GFR calc Af Amer: 116 mL/min/{1.73_m2} (ref >59)
GFR calc non Af Amer: 101 mL/min/{1.73_m2} (ref >59)
Globulin, Total: 2.8 g/dL (ref 1.5–4.5)
Glucose: 156 mg/dL — ABNORMAL HIGH (ref 65–99)
Potassium: 4.4 mmol/L (ref 3.5–5.2)
Sodium: 142 mmol/L (ref 134–144)
Total Protein: 6.6 g/dL (ref 6.0–8.5)

## 2019-12-08 LAB — CBC WITH DIFFERENTIAL/PLATELET
Basophils Absolute: 0 10*3/uL (ref 0.0–0.2)
Basos: 0 %
EOS (ABSOLUTE): 0.1 10*3/uL (ref 0.0–0.4)
Eos: 1 %
Hematocrit: 29.5 % — ABNORMAL LOW (ref 34.0–46.6)
Hemoglobin: 9.5 g/dL — ABNORMAL LOW (ref 11.1–15.9)
Immature Grans (Abs): 0 10*3/uL (ref 0.0–0.1)
Immature Granulocytes: 0 %
Lymphocytes Absolute: 3.2 10*3/uL — ABNORMAL HIGH (ref 0.7–3.1)
Lymphs: 33 %
MCH: 27.7 pg (ref 26.6–33.0)
MCHC: 32.2 g/dL (ref 31.5–35.7)
MCV: 86 fL (ref 79–97)
Monocytes Absolute: 0.5 10*3/uL (ref 0.1–0.9)
Monocytes: 5 %
Neutrophils Absolute: 5.8 10*3/uL (ref 1.4–7.0)
Neutrophils: 61 %
Platelets: 460 10*3/uL — ABNORMAL HIGH (ref 150–450)
RBC: 3.43 x10E6/uL — ABNORMAL LOW (ref 3.77–5.28)
RDW: 14.8 % (ref 11.7–15.4)
WBC: 9.7 10*3/uL (ref 3.4–10.8)

## 2019-12-08 LAB — INSULIN, RANDOM: INSULIN: 652 u[IU]/mL — ABNORMAL HIGH (ref 2.6–24.9)

## 2019-12-08 LAB — HEMOGLOBIN A1C
Est. average glucose Bld gHb Est-mCnc: 117 mg/dL
Hgb A1c MFr Bld: 5.7 % — ABNORMAL HIGH (ref 4.8–5.6)

## 2019-12-08 LAB — VITAMIN D 25 HYDROXY (VIT D DEFICIENCY, FRACTURES): Vit D, 25-Hydroxy: 26.2 ng/mL — ABNORMAL LOW (ref 30.0–100.0)

## 2019-12-19 ENCOUNTER — Encounter (INDEPENDENT_AMBULATORY_CARE_PROVIDER_SITE_OTHER): Payer: Self-pay

## 2019-12-19 ENCOUNTER — Encounter (INDEPENDENT_AMBULATORY_CARE_PROVIDER_SITE_OTHER): Payer: Self-pay | Admitting: Family Medicine

## 2019-12-19 ENCOUNTER — Ambulatory Visit (INDEPENDENT_AMBULATORY_CARE_PROVIDER_SITE_OTHER): Payer: Medicare Other | Admitting: Family Medicine

## 2019-12-21 ENCOUNTER — Encounter: Payer: Self-pay | Admitting: Student in an Organized Health Care Education/Training Program

## 2019-12-21 ENCOUNTER — Ambulatory Visit (INDEPENDENT_AMBULATORY_CARE_PROVIDER_SITE_OTHER): Payer: Medicare Other | Admitting: Family Medicine

## 2019-12-21 ENCOUNTER — Other Ambulatory Visit: Payer: Self-pay

## 2019-12-21 ENCOUNTER — Other Ambulatory Visit (HOSPITAL_COMMUNITY)
Admission: RE | Admit: 2019-12-21 | Discharge: 2019-12-21 | Disposition: A | Discharge status: Home / Self Care | Payer: Medicare Other | Source: Ambulatory Visit | Attending: Family Medicine | Admitting: Family Medicine

## 2019-12-21 VITALS — BP 118/80 | HR 97 | Wt 294.4 lb

## 2019-12-21 DIAGNOSIS — Z113 Encounter for screening for infections with a predominantly sexual mode of transmission: Secondary | ICD-10-CM | POA: Diagnosis not present

## 2019-12-21 DIAGNOSIS — A599 Trichomoniasis, unspecified: Secondary | ICD-10-CM | POA: Diagnosis not present

## 2019-12-21 DIAGNOSIS — Z114 Encounter for screening for human immunodeficiency virus [HIV]: Secondary | ICD-10-CM | POA: Diagnosis not present

## 2019-12-21 LAB — POCT WET PREP (WET MOUNT): Clue Cells Wet Prep Whiff POC: NEGATIVE

## 2019-12-21 MED ORDER — METRONIDAZOLE 500 MG PO TABS: 500.0000 mg | ORAL_TABLET | Freq: Two times a day (BID) | ORAL | 0 refills | Status: DC

## 2019-12-21 NOTE — Patient Instructions (Addendum)
It was great meeting you today!  I am sorry about your vaginal odor and discharge.  We did a wet prep today which was positive for trichomoniasis.  The treatment for this is Flagyl 500 mg 2 times per day for 7 days.  I would also recommend your partner come get treated because he continues have sex that individual you will get trichiasis again.  Advised him to contact his PCP let them know that he tested positive and he needs treatment.

## 2019-12-22 LAB — CERVICOVAGINAL ANCILLARY ONLY
Chlamydia: NEGATIVE
Comment: NEGATIVE
Comment: NORMAL
Neisseria Gonorrhea: NEGATIVE

## 2019-12-26 ENCOUNTER — Encounter: Payer: Self-pay | Admitting: Family Medicine

## 2019-12-26 NOTE — Progress Notes (Signed)
   HPI 38 year old African-American female who presents for vaginal odor and clear discharge.  This is been going on for last 3 to 4 days.  Has had trichomoniasis before feels that this is similar.  She wonders if she may have gotten it because her sexual partner has poor hygiene.  No other symptoms such as pain, itching, blood.  CC: Vaginal odor, clear discharge   ROS:   Review of Systems See HPI for ROS.   CC, SH/smoking status, and VS noted  Objective: BP 118/80   Pulse 97   Wt 294 lb 6.4 oz (133.5 kg)   SpO2 99%   BMI 53.85 kg/m  Gen: 38 year old Afro-American female, no acute distress CV: Skin warm and dry Resp: No accessory muscle use, no respiratory distress Neuro: Alert and oriented, Speech clear, No gross deficits GU: Mild clear discharge noted, cervix easily visualized  Wet prep: Positive for trichomoniasis  Assessment and plan:  Trichimoniasis Wet prep positive for trichomoniasis.  Will treat with Flagyl 500 mg twice daily for 7 days.  Stressed importance of having her partner also get treated by his PCP.  Explained the sexually-transmitted nature of trich to the patient and explained that if her partner goes untreated and they have intercourse again she will get trichomoniasis again.  GC/chlamydia negative.  Patient refused blood testing for syphilis and HIV. -Flagyl 500 mg twice daily for 7 days     Orders Placed This Encounter  Procedures  . POCT Wet Prep [facility])    Meds ordered this encounter  Medications  . metroNIDAZOLE (FLAGYL) 500 MG tablet    Sig: Take 1 tablet (500 mg total) by mouth 2 (two) times daily.    Dispense:  14 tablet    Refill:  0     Guadalupe Dawn MD PGY-3 Family Medicine Resident  12/26/2019 1:13 AM

## 2019-12-26 NOTE — Assessment & Plan Note (Addendum)
Wet prep positive for trichomoniasis.  Will treat with Flagyl 500 mg twice daily for 7 days.  Stressed importance of having her partner also get treated by his PCP.  Explained the sexually-transmitted nature of trich to the patient and explained that if her partner goes untreated and they have intercourse again she will get trichomoniasis again.  GC/chlamydia negative.  Patient refused blood testing for syphilis and HIV. -Flagyl 500 mg twice daily for 7 days

## 2020-01-02 ENCOUNTER — Telehealth: Payer: Self-pay | Admitting: *Deleted

## 2020-01-02 NOTE — Telephone Encounter (Signed)
Patient reported to pharmacy that her medication didn't work and would like to try something else if possible.  Will forward to MD who saw patient for this appt. Carmesha Morocco,CMA

## 2020-01-06 ENCOUNTER — Ambulatory Visit: Payer: Medicare Other | Admitting: Gastroenterology

## 2020-01-12 NOTE — Telephone Encounter (Signed)
Tested positive for trich and received the usual treatment for that. If still having issues would recommend pcp follow up  Guadalupe Dawn MD PGY-3 Family Medicine Resident

## 2020-01-13 ENCOUNTER — Other Ambulatory Visit: Payer: Self-pay

## 2020-01-13 ENCOUNTER — Ambulatory Visit (INDEPENDENT_AMBULATORY_CARE_PROVIDER_SITE_OTHER): Payer: Medicare Other | Admitting: Student in an Organized Health Care Education/Training Program

## 2020-01-13 ENCOUNTER — Encounter: Payer: Self-pay | Admitting: Student in an Organized Health Care Education/Training Program

## 2020-01-13 VITALS — BP 135/82 | HR 106 | Temp 98.1°F | Wt 286.0 lb

## 2020-01-13 DIAGNOSIS — N898 Other specified noninflammatory disorders of vagina: Secondary | ICD-10-CM

## 2020-01-13 DIAGNOSIS — A599 Trichomoniasis, unspecified: Secondary | ICD-10-CM | POA: Diagnosis not present

## 2020-01-13 LAB — POCT WET PREP (WET MOUNT)
Clue Cells Wet Prep Whiff POC: NEGATIVE
Trichomonas Wet Prep HPF POC: ABSENT

## 2020-01-13 NOTE — Patient Instructions (Signed)
It was a pleasure to see you today!  To summarize our discussion for this visit:  I will review your chart to fill out your form and get it back to you by next week.  Your exam today looked good but I will let you know what the test results show and we can treat as needed.    Call the clinic at [phone] if your symptoms worsen or you have any concerns.   Thank you for allowing me to take part in your care,  Dr. Doristine Mango

## 2020-01-13 NOTE — Assessment & Plan Note (Signed)
Patient and partner completed treatment and abstained until complete.  Pelvic exam today was reassuring that infection has cleared. Wet prep negative. Reassured patient Follow up as needed

## 2020-01-13 NOTE — Progress Notes (Signed)
   Subjective:    Patient ID: Melinda Ruiz, female    DOB: 02/08/1981, 39 y.o.   MRN: [id number]  CC: continued symptoms from previous trich infection  HPI:  Patient here for follow-up after treatment for trichomonas. Patient states that she completed the full treatment as well as her partner and they abstain from sex until both treatments were complete. Patient endorses having a metallic taste and continued vaginal discharge as her symptoms. Denies dysuria, bleeding, pain, vaginal itching.  Has paperwork with today indicating need for physician statement for approval for weight loss surgery. Patient had previous surgery and would like revision for GERD symptoms.  Smoking status reviewed   ROS: pertinent noted in the HPI   I have personally reviewed pertinent past medical history, surgical, family, and social history as appropriate. Objective:  BP 135/82   Pulse (!) 106   Temp 98.1 F (36.7 C) (Oral)   Wt 286 lb (129.7 kg)   LMP 12/28/2019   SpO2 98%   BMI 52.31 kg/m   Vitals and nursing note reviewed  General: NAD, pleasant, able to participate in exam Pelvic: External genitalia without abnormality.  Vaginal canal has very mild clear discharge.  Cervix was nonerythematous. Extremities: no edema or cyanosis. Skin: warm and dry, no rashes noted Neuro: alert, no obvious focal deficits Psych: Normal affect and mood  Assessment & Plan:   Trichimoniasis Patient and partner completed treatment and abstained until complete.  Pelvic exam today was reassuring that infection has cleared. Wet prep negative. Reassured patient Follow up as needed  Patient requested a physician letter and form filled out for [facility] surgery to get revision of her bariatric surgery as she feels that her previous banding is causing her acid reflux.  She has been going to the nutrition service and attempting to lose weight on her own.  Orders Placed This Encounter  Procedures  . POCT  Wet Prep [facility] [place])   Doristine Mango, Melinda Ruiz Medicine PGY-2

## 2020-02-02 ENCOUNTER — Telehealth: Payer: Self-pay | Admitting: Student in an Organized Health Care Education/Training Program

## 2020-02-02 NOTE — Telephone Encounter (Signed)
Patient came into office about a form concerning her weight loss surgery that was supposed to be completed ad placed upfront, and I couldn't find it. Please give pt a call.

## 2020-02-06 NOTE — Telephone Encounter (Signed)
Pt is calling to check on the status of the forms being completed. She said that they were dropped off a couple weeks ago and has to have the form completed to continue scheduling her future appointments before surgery.   Pt would like for Dr. Ouida Sills to call her to discuss paperwork.  The best call back number is [phone]

## 2020-02-09 NOTE — Telephone Encounter (Signed)
I called patient back to let her know that I could not find the form and asked if she could get another one. I apologized and she was understanding. She will be attempting to get another copy and bring in to drop off at front desk.

## 2020-06-29 ENCOUNTER — Ambulatory Visit: Payer: Medicare Other

## 2020-08-23 ENCOUNTER — Ambulatory Visit: Payer: Medicare Other | Admitting: Neurology

## 2020-08-31 ENCOUNTER — Other Ambulatory Visit: Payer: Medicare Other

## 2020-08-31 ENCOUNTER — Other Ambulatory Visit: Payer: Self-pay

## 2020-08-31 DIAGNOSIS — Z20822 Contact with and (suspected) exposure to covid-19: Secondary | ICD-10-CM

## 2020-09-01 LAB — NOVEL CORONAVIRUS, NAA: SARS-CoV-2, NAA: NOT DETECTED

## 2020-09-11 ENCOUNTER — Emergency Department (HOSPITAL_COMMUNITY)
Admission: EM | Admit: 2020-09-11 | Discharge: 2020-09-11 | Disposition: A | Discharge status: Home / Self Care | Payer: Medicare Other | Attending: Emergency Medicine | Admitting: Emergency Medicine

## 2020-09-11 ENCOUNTER — Other Ambulatory Visit: Payer: Self-pay

## 2020-09-11 ENCOUNTER — Encounter (HOSPITAL_COMMUNITY): Payer: Self-pay

## 2020-09-11 DIAGNOSIS — Z87891 Personal history of nicotine dependence: Secondary | ICD-10-CM | POA: Insufficient documentation

## 2020-09-11 DIAGNOSIS — W228XXA Striking against or struck by other objects, initial encounter: Secondary | ICD-10-CM | POA: Diagnosis not present

## 2020-09-11 DIAGNOSIS — Y9389 Activity, other specified: Secondary | ICD-10-CM | POA: Insufficient documentation

## 2020-09-11 DIAGNOSIS — E119 Type 2 diabetes mellitus without complications: Secondary | ICD-10-CM | POA: Insufficient documentation

## 2020-09-11 DIAGNOSIS — Y92810 Car as the place of occurrence of the external cause: Secondary | ICD-10-CM | POA: Insufficient documentation

## 2020-09-11 DIAGNOSIS — Y999 Unspecified external cause status: Secondary | ICD-10-CM | POA: Diagnosis not present

## 2020-09-11 DIAGNOSIS — S169XXA Unspecified injury of muscle, fascia and tendon at neck level, initial encounter: Secondary | ICD-10-CM | POA: Diagnosis present

## 2020-09-11 DIAGNOSIS — Z79899 Other long term (current) drug therapy: Secondary | ICD-10-CM | POA: Insufficient documentation

## 2020-09-11 DIAGNOSIS — I10 Essential (primary) hypertension: Secondary | ICD-10-CM | POA: Insufficient documentation

## 2020-09-11 DIAGNOSIS — S161XXA Strain of muscle, fascia and tendon at neck level, initial encounter: Secondary | ICD-10-CM | POA: Diagnosis not present

## 2020-09-11 DIAGNOSIS — M545 Low back pain: Secondary | ICD-10-CM | POA: Diagnosis not present

## 2020-09-11 MED ORDER — LIDOCAINE 5 % EX PTCH: 1.0000 | MEDICATED_PATCH | CUTANEOUS | 0 refills | Status: DC

## 2020-09-11 MED ORDER — METHOCARBAMOL 500 MG PO TABS: 500.0000 mg | ORAL_TABLET | Freq: Three times a day (TID) | ORAL | 0 refills | Status: DC | PRN

## 2020-09-11 NOTE — ED Provider Notes (Signed)
[facility] DEPT Provider Note   CSN: [id number] Arrival date & time: 09/11/20  7062     History Chief Complaint  Patient presents with  . Neck Pain  . Back Pain    Melinda Ruiz is a 39 y.o. female.  She is admitted with complaint of posterior left neck pain radiating to her left trapezius upper back and shoulder that began a few months ago.  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  Sometimes associated with some tingling in that arm.  She states she had an MRI in the past it sounds like she has a Chiari malformation.  She has neurology follow-up next month.  Taking Tylenol for pain.  Cannot take NSAIDs as had gastric bypass surgery.  The history is provided by the patient.  Neck Pain Pain location:  L side Quality:  Stabbing Pain radiates to:  L shoulder Pain severity:  Moderate Onset quality:  Gradual Duration:  2 months Timing:  Intermittent Progression:  Unchanged Chronicity:  New Context: not recent injury   Relieved by:  Nothing Worsened by:  Bending and twisting Ineffective treatments:  Analgesics Associated symptoms: tingling   Associated symptoms: no bladder incontinence, no bowel incontinence, no chest pain and no fever   Back Pain Associated symptoms: tingling   Associated symptoms: no abdominal pain, no bladder incontinence, no bowel incontinence, no chest pain, no dysuria and no fever        Past Medical History:  Diagnosis Date  . Acid reflux   . Anemia, during pregnancy   . Bipolar disorder ([place])   . Chiari I malformation ([place])   . Complication of anesthesia    "Oxygen level drops"  . Depression   . Diabetes mellitus without complication ([place])   . Fibroid   . Gallbladder problem   . Hypertension   . Kidney stones 2010  . Lactose intolerance   . Osteoarthritis   . Schizophrenia ([place])   . Sleep apnea   . Ulcer of abdomen wall [facility])     Patient Active Problem List   Diagnosis Date  Noted  . Carpal tunnel syndrome of left wrist 09/02/2019  . Depression 01/17/2019  . Trichimoniasis 09/09/2018  . Chronic migraine 07/30/2017  . Chiari malformation type I ([place]) 07/30/2017  . Thyromegaly 04/09/2016  . Oropharyngeal dysphagia 04/09/2016  . Multiple thyroid nodules 04/09/2016  . Gastroesophageal reflux disease 04/09/2016  . Diabetes mellitus type 2 in obese ([place]) 10/02/2015  . Low HDL (under 40) 10/02/2015  . 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 Dysfunctional uterine bleeding 06/19/2014  . Depo-Provera contraceptive status 06/19/2014  . Hypertension, benign 06/08/2013  . Menorrhagia 06/08/2013    Past Surgical History:  Procedure Laterality Date  . BREATH TEK H PYLORI N/A 05/31/2015   Procedure: BREATH TEK H PYLORI;  Surgeon: Greer Pickerel, MD;  Location: [facility];  Service: General;  Laterality: N/A;  . CARPAL TUNNEL RELEASE  02/03/2012   Procedure: CARPAL TUNNEL RELEASE;  Surgeon: Meredith Pel, MD;  Location: [facility];  Service: Orthopedics;  Laterality: Left;  left carpal tunnel release  . CARPAL TUNNEL RELEASE  01/11/2013   Procedure: CARPAL TUNNEL RELEASE;  Surgeon: Meredith Pel, MD;  Location: [facility];  Service: Orthopedics;  Laterality: Right;  Hand Set, Small Retractors, Lead Hand  . CESAREAN SECTION  2006, 2011  . CHOLECYSTECTOMY  06/2011  . cystectomy    .  GANGLION CYST EXCISION  02/03/2012   Procedure: REMOVAL GANGLION OF WRIST;  Surgeon: Meredith Pel, MD;  Location: [facility];  Service: Orthopedics;  Laterality: Left;  left dorsal ganglion cyst excision  . LAPAROSCOPIC GASTRIC SLEEVE RESECTION N/A 10/02/2015   Procedure: LAPAROSCOPIC GASTRIC SLEEVE RESECTION;  Surgeon: Greer Pickerel, MD;  [facility];  Service: General;  Laterality: N/A;  . Lymp Glands Removed--under arm bil    . Lympectomy    . MULTIPLE TOOTH EXTRACTIONS  2003   and wisdom teeth  . Pilonidial Cyst  Removed  1990's  . TUBAL LIGATION  2011  . UPPER GI ENDOSCOPY  10/02/2015   Procedure: UPPER GI ENDOSCOPY;  Surgeon: Greer Pickerel, MD;  [facility];  Service: General;;     OB History    Gravida  3   Para  2   Term  2   Preterm  0   AB  1   Living  2     SAB  1   TAB  0   Ectopic  0   Multiple  0   Live Births  2           Family History  Problem Relation Age of Onset  . Kidney cancer Father   . Diabetes Mellitus II Father   . Hypertension Father   . Arthritis Mother   . Diabetes Mellitus II Mother   . Hypertension Mother   . Diabetes Mother   . Anxiety disorder Mother   . Anesthesia problems Neg Hx     Social History   Tobacco Use  . Smoking status: Former Smoker    Packs/day: 0.25    Types: Cigarettes    Quit date: 10/01/2009    Years since quitting: 10.9  . Smokeless tobacco: Never Used  Vaping Use  . Vaping Use: Never used  Substance Use Topics  . Alcohol use: Yes    Comment: has a drink twice weekly  . Drug use: No    Home Medications Prior to Admission medications   Medication Sig Start Date End Date Taking? Authorizing Provider  cyclobenzaprine (FLEXERIL) 5 MG tablet Take 1-2 tablets (5-10 mg total) by mouth 2 (two) times daily as needed for muscle spasms. 11/21/19   Wieters, Hallie C, PA-C  diclofenac sodium (VOLTAREN) 1 % GEL Apply 2 g topically 4 (four) times daily. 07/26/19   Anderson, Chelsey L, DO  metroNIDAZOLE (FLAGYL) 500 MG tablet Take 1 tablet (500 mg total) by mouth 2 (two) times daily. 12/21/19   Guadalupe Dawn, MD  norethindrone (MICRONOR) 0.35 MG tablet Take 1 tablet (0.35 mg total) by mouth daily. 05/30/19   Lovenia Kim, MD  pantoprazole (PROTONIX) 40 MG tablet TAKE 1 TABLET BY MOUTH EVERY DAY 10/21/19   Doran Stabler, MD  predniSONE (STERAPRED UNI-PAK 21 TAB) 10 MG (21) TBPK tablet Take by mouth daily. Per box instruction 11/21/19   Wieters, Hallie C, PA-C  sertraline (ZOLOFT) 25 MG tablet Take 1 tablet (25 mg  total) by mouth daily. 01/17/19   Lovenia Kim, MD  sucralfate (CARAFATE) 1 g tablet TAKE 1 TABLET BY MOUTH EVERY 6 HOURS AS NEEDED FOR ULCERS. Can crush and mix with water or applesauce if needed. 02/25/16   [provider]  Vitamin D, Ergocalciferol, (DRISDOL) 1.25 MG (50000 UT) CAPS capsule Take 1 capsule (50,000 Units total) by mouth every 7 (seven) days. 12/05/19   Briscoe Deutscher, DO    Allergies    Ibuprofen, Penicillins, and Oxycodone-acetaminophen  Review of Systems   Review of Systems  Constitutional: Negative for fever.  HENT: Negative for sore throat.   Eyes: Negative for pain.  Respiratory: Negative for shortness of breath.   Cardiovascular: Negative for chest pain.  Gastrointestinal: Negative for abdominal pain and bowel incontinence.  Genitourinary: Negative for bladder incontinence and dysuria.  Musculoskeletal: Positive for back pain and neck pain.  Skin: Negative for rash.  Neurological: Positive for tingling. Negative for speech difficulty.    Physical Exam Updated Vital Signs BP (!) 150/99 (BP Location: Left Arm)   Pulse 98   Temp 98.4 F (36.9 C) (Oral)   Resp 18   SpO2 96%   Physical Exam Vitals and nursing note reviewed.  Constitutional:      General: She is not in acute distress.    Appearance: Normal appearance. She is well-developed.  HENT:     Head: Normocephalic and atraumatic.  Eyes:     Conjunctiva/sclera: Conjunctivae normal.  Neck:     Comments: Tenderness left occipital radiating to left paracervical trapezius.  Pain reproduced with palpation.  No overlying skin findings. Cardiovascular:     Rate and Rhythm: Normal rate and regular rhythm.     Heart sounds: No murmur heard.   Pulmonary:     Effort: Pulmonary effort is normal. No respiratory distress.     Breath sounds: Normal breath sounds.  Abdominal:     Palpations: Abdomen is soft.     Tenderness: There is no abdominal tenderness.  Musculoskeletal:        General:  Tenderness (see neck) present.     Cervical back: Neck supple. Tenderness present.  Skin:    General: Skin is warm and dry.     Capillary Refill: Capillary refill takes less than 2 seconds.  Neurological:     General: No focal deficit present.     Mental Status: She is alert.     Cranial Nerves: No cranial nerve deficit.     Sensory: No sensory deficit.     Motor: No weakness.     ED Results / Procedures / Treatments   Labs (all labs ordered are listed, but only abnormal results are displayed) Labs Reviewed - No data to display  EKG None  Radiology No results found.  Procedures Procedures (including critical care time)  Medications Ordered in ED Medications - No data to display  ED Course  I have reviewed the triage vital signs and the nursing notes.  Pertinent labs & imaging results that were available during my care of the patient were reviewed by me and considered in my medical decision making (see chart for details).    MDM Rules/Calculators/A&P                         38 year old female with history of a Chiari malformation here with worsening neck and right shoulder pain going on for a few months.  She is here with her daughter that is getting checked out for possible cellulitis.  No new trauma.  Works a physical job.  Exam mostly consistent with musculoskeletal pain although could be radicular.  Do not think it is anything to do with her Chiari.  Difficult to treat as she cannot take NSAIDs secondary to her gastric bypass and she said that the muscle relaxants sedate her and she can take starting work.  We will trial her on some Robaxin and topical Lidoderm patches.  Recommend she talk to her primary care doctor  regarding physical therapy.  She also has a neurology appointment coming up next month which I encouraged her to keep.  Return instructions discussed.  Final Clinical Impression(s) / ED Diagnoses Final diagnoses:  Acute strain of neck muscle, initial  encounter    Rx / DC Orders ED Discharge Orders         Ordered    methocarbamol (ROBAXIN) 500 MG tablet  Every 8 hours PRN        09/11/20 1149    lidocaine (LIDODERM) 5 %  Every 24 hours        09/11/20 1149           Hayden Rasmussen, MD 09/11/20 [id number]

## 2020-09-11 NOTE — Discharge Instructions (Addendum)
You were seen in the emergency department for worsening left-sided neck pain.  This is likely muscular but unfortunately you cannot take anti-inflammatories due to your gastric bypass.  We are prescribing a muscle relaxant and topical pain patch.  Please contact your primary care doctor for referral to physical therapy.

## 2020-09-11 NOTE — ED Triage Notes (Signed)
Pt states that she was told part of her brain went into her spine at her eye appointment head scan, causing pain in her neck. Pt states that she was instructed to not do any straining. However, pt works at [employer] Pt states that she has been having pain in her neck, down her left shoulder. Pt has an appt with neurology, but could not get in until October.

## 2020-09-26 ENCOUNTER — Ambulatory Visit: Payer: Medicare Other | Admitting: Family Medicine

## 2020-09-26 ENCOUNTER — Ambulatory Visit: Payer: Medicare Other

## 2020-10-02 ENCOUNTER — Encounter: Payer: Self-pay | Admitting: Neurology

## 2020-10-02 ENCOUNTER — Telehealth: Payer: Self-pay | Admitting: Neurology

## 2020-10-02 ENCOUNTER — Other Ambulatory Visit: Payer: Self-pay

## 2020-10-02 ENCOUNTER — Ambulatory Visit (INDEPENDENT_AMBULATORY_CARE_PROVIDER_SITE_OTHER): Payer: Medicare Other | Admitting: Neurology

## 2020-10-02 VITALS — BP 122/83 | HR 95 | Ht 62.0 in | Wt 303.0 lb

## 2020-10-02 DIAGNOSIS — H538 Other visual disturbances: Secondary | ICD-10-CM | POA: Diagnosis not present

## 2020-10-02 DIAGNOSIS — E669 Obesity, unspecified: Secondary | ICD-10-CM | POA: Diagnosis not present

## 2020-10-02 DIAGNOSIS — Z6841 Body Mass Index (BMI) 40.0 and over, adult: Secondary | ICD-10-CM | POA: Insufficient documentation

## 2020-10-02 MED ORDER — TOPIRAMATE 100 MG PO TABS: 100.0000 mg | ORAL_TABLET | Freq: Two times a day (BID) | ORAL | 11 refills | Status: DC

## 2020-10-02 MED ORDER — SUMATRIPTAN SUCCINATE 100 MG PO TABS: 100.0000 mg | ORAL_TABLET | Freq: Once | ORAL | 6 refills | Status: DC | PRN

## 2020-10-02 NOTE — Telephone Encounter (Signed)
Medicare/medicaid order sent to GI. No auth they will reach out to the patient to schedule.  °

## 2020-10-02 NOTE — Progress Notes (Signed)
Chief Complaint  Patient presents with  . New Patient (Initial Visit)    Last seen 2018. Referred to re-establish care. Melinda Ruiz malformation type I, optic papittitis and migraines. She estimates two migraines weekly and uses Tylenol for pain.   Melinda Ruiz    Melinda Cotton, MD - referring provider  . PCP    Melinda Osmond, DO    HISTORICAL  Melinda Ruiz is a 39 year old female, seen in request by primary care physician Dr. Ouida Ruiz, [facility] for evaluation of migraine headaches, optic papillitis,  I reviewed and summarized the referring note.  Past medical history Bipolar disorder Diabetes Hypertension Kidney stone  She complains of frequent left-sided headache, starting from left occipital region, running down her left shoulder, to her left arm, intermittent, lasting for couple hours, and happens 3-4 times each month, with associated left-sided blurry vision,  Examination from ophthalmologist Melinda Ruiz by Dr. Gevena Ruiz showed irregular margin elevation he, anteriorles/venules tortuosity abnormality.  She has tried over-the-counter Tylenol with limited help, also complains of left-sided blurry vision  REVIEW OF SYSTEMS: Full 14 system review of systems performed and notable only for as above All other review of systems were negative.  ALLERGIES: Allergies  Allergen Reactions  . Ibuprofen Itching  . Penicillins Hives    .Has patient had a PCN reaction causing immediate rash, facial/tongue/throat swelling, SOB or lightheadedness with hypotension: No Has patient had a PCN reaction causing severe rash involving mucus membranes or skin necrosis: No Has patient had a PCN reaction that required hospitalization No Has patient had a PCN reaction occurring within the last 10 years: No If all of the above answers are "NO", then Melinda proceed with Cephalosporin use.  Marland Kitchen Oxycodone-Acetaminophen Itching    HOME MEDICATIONS: Current Outpatient  Medications  Medication Sig Dispense Refill  . acetaminophen (TYLENOL) 500 MG tablet Take 500 mg by mouth every 6 (six) hours as needed.    . methocarbamol (ROBAXIN) 500 MG tablet Take 1 tablet (500 mg total) by mouth every 8 (eight) hours as needed for muscle spasms. 30 tablet 0  . pantoprazole (PROTONIX) 40 MG tablet TAKE 1 TABLET BY MOUTH EVERY DAY 90 tablet 0  . sucralfate (CARAFATE) 1 g tablet TAKE 1 TABLET BY MOUTH EVERY 6 HOURS AS NEEDED FOR ULCERS. Can crush and mix with water or applesauce if needed.     No current facility-administered medications for this visit.    PAST MEDICAL HISTORY: Past Medical History:  Diagnosis Date  . Acid reflux   . Anemia, during pregnancy   . Bipolar disorder ([place])   . Chiari I malformation ([place])   . Complication of anesthesia    "Oxygen level drops"  . Depression   . Diabetes mellitus without complication ([place])   . Fibroid   . Gallbladder problem   . Hypertension   . Kidney stones 2010  . Lactose intolerance   . Migraine   . Osteoarthritis   . Schizophrenia ([place])   . Sleep apnea   . Ulcer of abdomen wall ([place])     PAST SURGICAL HISTORY: Past Surgical History:  Procedure Laterality Date  . BREATH TEK H PYLORI N/A 05/31/2015   Procedure: BREATH TEK H PYLORI;  Surgeon: Greer Pickerel, MD;  Location: [facility];  Service: General;  Laterality: N/A;  . CARPAL TUNNEL RELEASE  02/03/2012   Procedure: CARPAL TUNNEL RELEASE;  Surgeon: Meredith Pel, MD;  Location: [facility];  Service: Orthopedics;  Laterality: Left;  left  carpal tunnel release  . CARPAL TUNNEL RELEASE  01/11/2013   Procedure: CARPAL TUNNEL RELEASE;  Surgeon: Meredith Pel, MD;  Location: [facility];  Service: Orthopedics;  Laterality: Right;  Hand Set, Small Retractors, Lead Hand  . CESAREAN SECTION  2006, 2011  . CHOLECYSTECTOMY  06/2011  . cystectomy    . GANGLION CYST EXCISION  02/03/2012   Procedure: REMOVAL GANGLION OF WRIST;  Surgeon: Meredith Pel, MD;  Location: [facility];  Service: Orthopedics;  Laterality: Left;  left dorsal ganglion cyst excision  . LAPAROSCOPIC GASTRIC SLEEVE RESECTION N/A 10/02/2015   Procedure: LAPAROSCOPIC GASTRIC SLEEVE RESECTION;  Surgeon: Greer Pickerel, MD;  [facility];  Service: General;  Laterality: N/A;  . Lymp Glands Removed--under arm bil    . Lympectomy    . MULTIPLE TOOTH EXTRACTIONS  2003   and wisdom teeth  . Pilonidial Cyst Removed  1990's  . TUBAL LIGATION  2011  . UPPER GI ENDOSCOPY  10/02/2015   Procedure: UPPER GI ENDOSCOPY;  Surgeon: Greer Pickerel, MD;  [facility];  Service: General;;    FAMILY HISTORY: Family History  Problem Relation Age of Onset  . Kidney cancer Father   . Diabetes Mellitus II Father   . Hypertension Father   . Arthritis Mother   . Diabetes Mellitus II Mother   . Hypertension Mother   . Diabetes Mother   . Anxiety disorder Mother   . Anesthesia problems Neg Hx     SOCIAL HISTORY: Social History   Socioeconomic History  . Marital status: Single    Spouse name: Not on file  . Number of children: 2  . Years of education: 25  . Highest education level: High school graduate  Occupational History  . Occupation: Dealer  Tobacco Use  . Smoking status: Former Smoker    Packs/day: 0.25    Types: Cigarettes    Quit date: 10/01/2009    Years since quitting: 11.0  . Smokeless tobacco: Never Used  Vaping Use  . Vaping Use: Never used  Substance and Sexual Activity  . Alcohol use: Yes    Comment: has a drink twice weekly  . Drug use: No  . Sexual activity: Yes    Birth control/protection: Pill  Other Topics Concern  . Not on file  Social History Narrative   Lives at home with children.   Right-handed.   No daily caffeine use.   Social Determinants of Ruiz   Financial Resource Strain:   . Difficulty of Paying Living Expenses: Not on file  Food Insecurity:   . Worried About Charity fundraiser in the Last Year: Not on file  . Ran Out of Food in  the Last Year: Not on file  Transportation Needs:   . Lack of Transportation (Medical): Not on file  . Lack of Transportation (Non-Medical): Not on file  Physical Activity:   . Days of Exercise per Week: Not on file  . Minutes of Exercise per Session: Not on file  Stress:   . Feeling of Stress : Not on file  Social Connections:   . Frequency of Communication with Friends and Family: Not on file  . Frequency of Social Gatherings with Friends and Family: Not on file  . Attends Religious Services: Not on file  . Active Member of Clubs or Organizations: Not on file  . Attends Archivist Meetings: Not on file  . Marital Status: Not on file  Intimate Partner Violence:   .  Fear of Current or Ex-Partner: Not on file  . Emotionally Abused: Not on file  . Physically Abused: Not on file  . Sexually Abused: Not on file     PHYSICAL EXAM   Vitals:   10/02/20 0842  BP: 122/83  Pulse: 95  Weight: (!) 303 lb (137.4 kg)  Height: 5\' 2"  (1.575 m)   Not recorded     Body mass index is 55.42 kg/m.  PHYSICAL EXAMNIATION:  Gen: NAD, conversant, well nourised, well groomed                     Cardiovascular: Regular rate rhythm, no peripheral edema, warm, nontender. Eyes: Conjunctivae clear without exudates or hemorrhage Neck: Supple, no carotid bruits. Pulmonary: Clear to auscultation bilaterally   NEUROLOGICAL EXAM: Obesity  MENTAL STATUS: Speech:    Speech is normal; fluent and spontaneous with normal comprehension.  Cognition:     Orientation to time, place and person     Normal recent and remote memory     Normal Attention span and concentration     Normal Language, naming, repeating,spontaneous speech     Fund of knowledge   CRANIAL NERVES: CN II: Visual fields are full to confrontation. Pupils are round equal and briskly reactive to light. 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 CN V: Facial sensation is intact to light touch CN  VII: Face is symmetric with normal eye closure  CN VIII: Hearing is normal to causal conversation. CN IX, X: Phonation is normal. CN XI: Head turning and shoulder shrug are intact  MOTOR: There is no pronator drift of out-stretched arms. Muscle bulk and tone are normal. Muscle strength is normal.  REFLEXES: Reflexes are 2+ and symmetric at the biceps, triceps, knees, and ankles. Plantar responses are flexor.  SENSORY: Intact to light touch, pinprick and vibratory sensation are intact in fingers and toes.  COORDINATION: There is no trunk or limb dysmetria noted.  GAIT/STANCE: Posture is normal. Gait is steady with normal steps, base, arm swing, and turning. Heel and toe walking are normal. Tandem gait is normal.  Romberg is absent.   DIAGNOSTIC DATA (LABS, IMAGING, TESTING) - I reviewed patient records, labs, notes, testing and imaging myself where available.   ASSESSMENT AND PLAN  Melinda Ruiz is a 39 y.o. female    Chronic migraine headaches Obesity  MRI of brain  Lumbar puncture to check open pressure if MRI is normal.  Laboratory evaluations.      Marcial Pacas, M.D. Ph.D.  [facility] [address], [place], [place] [address] Ph: [phone] Fax: [phone]  CC:  Anderson, Chelsey L, DO [facility] [address] [place],  [place] [address]

## 2020-10-05 ENCOUNTER — Telehealth: Payer: Self-pay | Admitting: Neurology

## 2020-10-05 DIAGNOSIS — R899 Unspecified abnormal finding in specimens from other organs, systems and tissues: Secondary | ICD-10-CM | POA: Insufficient documentation

## 2020-10-05 LAB — CK: Total CK: 38 U/L (ref 32–182)

## 2020-10-05 LAB — COMPREHENSIVE METABOLIC PANEL
ALT: 14 IU/L (ref 0–32)
AST: 14 IU/L (ref 0–40)
Albumin/Globulin Ratio: 1.2 (ref 1.2–2.2)
Albumin: 3.7 g/dL — ABNORMAL LOW (ref 3.8–4.8)
Alkaline Phosphatase: 89 IU/L (ref 44–121)
BUN/Creatinine Ratio: 8 — ABNORMAL LOW (ref 9–23)
BUN: 6 mg/dL (ref 6–20)
Bilirubin Total: 0.3 mg/dL (ref 0.0–1.2)
CO2: 22 mmol/L (ref 20–29)
Calcium: 9.5 mg/dL (ref 8.7–10.2)
Chloride: 106 mmol/L (ref 96–106)
Creatinine, Ser: 0.78 mg/dL (ref 0.57–1.00)
GFR calc Af Amer: 112 mL/min/{1.73_m2} (ref >59)
GFR calc non Af Amer: 97 mL/min/{1.73_m2} (ref >59)
Globulin, Total: 3.2 g/dL (ref 1.5–4.5)
Glucose: 96 mg/dL (ref 65–99)
Potassium: 4 mmol/L (ref 3.5–5.2)
Sodium: 143 mmol/L (ref 134–144)
Total Protein: 6.9 g/dL (ref 6.0–8.5)

## 2020-10-05 LAB — CBC WITH DIFFERENTIAL/PLATELET
Basophils Absolute: 0 10*3/uL (ref 0.0–0.2)
Basos: 0 %
EOS (ABSOLUTE): 0.2 10*3/uL (ref 0.0–0.4)
Eos: 2 %
Hematocrit: 38 % (ref 34.0–46.6)
Hemoglobin: 12.3 g/dL (ref 11.1–15.9)
Immature Grans (Abs): 0 10*3/uL (ref 0.0–0.1)
Immature Granulocytes: 0 %
Lymphocytes Absolute: 2.8 10*3/uL (ref 0.7–3.1)
Lymphs: 27 %
MCH: 28 pg (ref 26.6–33.0)
MCHC: 32.4 g/dL (ref 31.5–35.7)
MCV: 87 fL (ref 79–97)
Monocytes Absolute: 0.6 10*3/uL (ref 0.1–0.9)
Monocytes: 6 %
Neutrophils Absolute: 6.7 10*3/uL (ref 1.4–7.0)
Neutrophils: 65 %
Platelets: 373 10*3/uL (ref 150–450)
RBC: 4.39 x10E6/uL (ref 3.77–5.28)
RDW: 14.9 % (ref 11.7–15.4)
WBC: 10.3 10*3/uL (ref 3.4–10.8)

## 2020-10-05 LAB — MULTIPLE MYELOMA PANEL, SERUM
Albumin SerPl Elph-Mcnc: 3.3 g/dL (ref 2.9–4.4)
Albumin/Glob SerPl: 1 (ref 0.7–1.7)
Alpha 1: 0.2 g/dL (ref 0.0–0.4)
Alpha2 Glob SerPl Elph-Mcnc: 0.7 g/dL (ref 0.4–1.0)
B-Globulin SerPl Elph-Mcnc: 1.2 g/dL (ref 0.7–1.3)
Gamma Glob SerPl Elph-Mcnc: 1.5 g/dL (ref 0.4–1.8)
Globulin, Total: 3.6 g/dL (ref 2.2–3.9)
IgA/Immunoglobulin A, Serum: 296 mg/dL (ref 87–352)
IgG (Immunoglobin G), Serum: 1412 mg/dL (ref 586–1602)
IgM (Immunoglobulin M), Srm: 29 mg/dL (ref 26–217)

## 2020-10-05 LAB — TSH: TSH: 1.76 u[IU]/mL (ref 0.450–4.500)

## 2020-10-05 LAB — SEDIMENTATION RATE: Sed Rate: 92 mm/hr — ABNORMAL HIGH (ref 0–32)

## 2020-10-05 LAB — HIV ANTIBODY (ROUTINE TESTING W REFLEX): HIV Screen 4th Generation wRfx: NONREACTIVE

## 2020-10-05 LAB — RPR: RPR Ser Ql: NONREACTIVE

## 2020-10-05 LAB — FOLATE: Folate: 7.6 ng/mL (ref >3.0)

## 2020-10-05 LAB — C-REACTIVE PROTEIN: CRP: 35 mg/L — ABNORMAL HIGH (ref 0–10)

## 2020-10-05 LAB — VITAMIN B12: Vitamin B-12: 501 pg/mL (ref 232–1245)

## 2020-10-05 LAB — FERRITIN: Ferritin: 52 ng/mL (ref 15–150)

## 2020-10-05 LAB — COPPER, SERUM: Copper: 211 ug/dL — ABNORMAL HIGH (ref 80–158)

## 2020-10-05 LAB — ANA W/REFLEX IF POSITIVE: Anti Nuclear Antibody (ANA): NEGATIVE

## 2020-10-05 NOTE — Telephone Encounter (Signed)
Please call patient  Laboratory evaluation showed significantly elevated ESR 92, C-reactive protein 35  Otherwise normal CMP, CBC  Please check with patient, if she has diffuse body achy pain, I also ordered repeat ESR C-reactive protein

## 2020-10-07 ENCOUNTER — Ambulatory Visit
Admission: RE | Admit: 2020-10-07 | Discharge: 2020-10-07 | Disposition: A | Discharge status: Home / Self Care | Payer: Medicare Other | Source: Ambulatory Visit | Attending: Neurology | Admitting: Neurology

## 2020-10-07 DIAGNOSIS — E669 Obesity, unspecified: Secondary | ICD-10-CM

## 2020-10-07 DIAGNOSIS — H538 Other visual disturbances: Secondary | ICD-10-CM | POA: Diagnosis not present

## 2020-10-08 ENCOUNTER — Telehealth: Payer: Self-pay | Admitting: Neurology

## 2020-10-08 DIAGNOSIS — H538 Other visual disturbances: Secondary | ICD-10-CM

## 2020-10-08 NOTE — Telephone Encounter (Signed)
I spoke to the patient. She denies any diffuse body pain. She is agreeable to come in for repeat labs.

## 2020-10-08 NOTE — Telephone Encounter (Signed)
The patient was provided with her MRI results and verbalized understanding.

## 2020-10-08 NOTE — Telephone Encounter (Signed)
  IMPRESSION: This MRI of the brain without contrast shows the following: 1.  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 2.  The brain is otherwise normal. 3.   No acute findings.  Please call patient, MRI of the brain showed 9 to 10 mm cerebellar ectopia, consistent with Arnold-Chiari 1 malformation, there was no acute abnormality, the brain was otherwise normal

## 2020-10-08 NOTE — Telephone Encounter (Signed)
Orders Placed This Encounter  Procedures  . DG FL GUIDED LUMBAR PUNCTURE

## 2020-10-08 NOTE — Telephone Encounter (Signed)
Per vo by Dr. Krista Blue, she would like the patient to proceed with LP. She has placed the order in Epic. The patient is aware to expect a call for scheduling.

## 2020-10-09 ENCOUNTER — Other Ambulatory Visit (INDEPENDENT_AMBULATORY_CARE_PROVIDER_SITE_OTHER): Payer: Self-pay

## 2020-10-09 DIAGNOSIS — Z0289 Encounter for other administrative examinations: Secondary | ICD-10-CM

## 2020-10-09 DIAGNOSIS — R899 Unspecified abnormal finding in specimens from other organs, systems and tissues: Secondary | ICD-10-CM

## 2020-10-10 LAB — C-REACTIVE PROTEIN: CRP: 15 mg/L — ABNORMAL HIGH (ref 0–10)

## 2020-10-10 LAB — SEDIMENTATION RATE: Sed Rate: 34 mm/hr — ABNORMAL HIGH (ref 0–32)

## 2020-10-18 ENCOUNTER — Other Ambulatory Visit: Payer: Self-pay

## 2020-10-18 ENCOUNTER — Telehealth: Payer: Self-pay | Admitting: *Deleted

## 2020-10-18 ENCOUNTER — Ambulatory Visit
Admission: RE | Admit: 2020-10-18 | Discharge: 2020-10-18 | Disposition: A | Discharge status: Home / Self Care | Payer: Medicare Other | Source: Ambulatory Visit | Attending: Neurology | Admitting: Neurology

## 2020-10-18 VITALS — BP 143/93 | HR 86

## 2020-10-18 DIAGNOSIS — H538 Other visual disturbances: Secondary | ICD-10-CM

## 2020-10-18 MED ORDER — ONDANSETRON HCL 4 MG PO TABS: 4.0000 mg | ORAL_TABLET | Freq: Three times a day (TID) | ORAL | 0 refills | Status: DC | PRN

## 2020-10-18 NOTE — Discharge Instructions (Signed)

## 2020-10-18 NOTE — Telephone Encounter (Signed)
-----   Message from Desmond Lope, RN sent at 10/18/2020  4:58 PM EDT -----  ----- Message ----- From: Buel Ream, [facility] Lab Results In Sent: 10/18/2020  12:01 PM EDT To: Marcial Pacas, MD

## 2020-10-18 NOTE — Telephone Encounter (Signed)
Per vo by Dr. Krista Blue, patient had an elevated opening pressure consistent with pseudotumor cerebri. The patient is already taking topiramate 100mg  BID. I have called and notified her of the results. She will continue her medication, work on weight loss and keep her follow up appt.  She has been experiencing nausea since her LP. Per vo by Dr. Krista Blue, okay to provide rx for ondansetron 4mg , one tablet every 8 hours prn, #20 x 0.

## 2020-11-16 LAB — CSF CELL COUNT WITH DIFFERENTIAL
RBC Count, CSF: 2 cells/uL — ABNORMAL HIGH
WBC, CSF: 0 cells/uL (ref 0–5)

## 2020-11-16 LAB — FUNGUS CULTURE W SMEAR
CULTURE:: NO GROWTH
MICRO NUMBER:: 11101183
SMEAR:: NONE SEEN
SPECIMEN QUALITY:: ADEQUATE

## 2020-11-16 LAB — GRAM STAIN
MICRO NUMBER:: 11101182
SPECIMEN QUALITY:: ADEQUATE

## 2020-11-16 LAB — GLUCOSE, CSF: Glucose, CSF: 55 mg/dL (ref 40–80)

## 2020-11-16 LAB — VDRL, CSF: VDRL Quant, CSF: NONREACTIVE

## 2020-11-16 LAB — PROTEIN, CSF: Total Protein, CSF: 35 mg/dL (ref 15–45)

## 2020-12-03 ENCOUNTER — Ambulatory Visit (INDEPENDENT_AMBULATORY_CARE_PROVIDER_SITE_OTHER): Payer: Medicaid Other | Admitting: Student in an Organized Health Care Education/Training Program

## 2020-12-03 ENCOUNTER — Other Ambulatory Visit (HOSPITAL_COMMUNITY)
Admission: RE | Admit: 2020-12-03 | Discharge: 2020-12-03 | Disposition: A | Discharge status: Home / Self Care | Payer: Medicaid Other | Source: Ambulatory Visit | Attending: Family Medicine | Admitting: Family Medicine

## 2020-12-03 ENCOUNTER — Other Ambulatory Visit: Payer: Self-pay

## 2020-12-03 VITALS — BP 142/80 | HR 124 | Ht 62.0 in | Wt 300.8 lb

## 2020-12-03 DIAGNOSIS — I10 Essential (primary) hypertension: Secondary | ICD-10-CM | POA: Diagnosis not present

## 2020-12-03 DIAGNOSIS — R000 Tachycardia, unspecified: Secondary | ICD-10-CM | POA: Diagnosis not present

## 2020-12-03 DIAGNOSIS — Z23 Encounter for immunization: Secondary | ICD-10-CM | POA: Diagnosis not present

## 2020-12-03 DIAGNOSIS — N898 Other specified noninflammatory disorders of vagina: Secondary | ICD-10-CM | POA: Insufficient documentation

## 2020-12-03 DIAGNOSIS — Z6841 Body Mass Index (BMI) 40.0 and over, adult: Secondary | ICD-10-CM | POA: Diagnosis not present

## 2020-12-03 DIAGNOSIS — M722 Plantar fascial fibromatosis: Secondary | ICD-10-CM

## 2020-12-03 DIAGNOSIS — Z113 Encounter for screening for infections with a predominantly sexual mode of transmission: Secondary | ICD-10-CM

## 2020-12-03 DIAGNOSIS — B353 Tinea pedis: Secondary | ICD-10-CM | POA: Diagnosis not present

## 2020-12-03 LAB — POCT WET PREP (WET MOUNT): Clue Cells Wet Prep Whiff POC: POSITIVE

## 2020-12-03 MED ORDER — TERBINAFINE HCL 1 % EX CREA: 1.0000 "application " | TOPICAL_CREAM | Freq: Two times a day (BID) | CUTANEOUS | 0 refills | Status: DC

## 2020-12-03 NOTE — Progress Notes (Signed)
   SUBJECTIVE:   CHIEF COMPLAINT / HPI: foot pain, obesity, STD testing, HTN  Bilateral foot pain. Has a standing job for 2 months which is when pain started. Not painful to the touch. Worse when standing. Its better when she has a day off or rests them. Gets worse at the end of her shift. Has soaked them and thinks it helped a little bit.  Health maintenance:  covid vaccine worries so declines at this time.  Will get pneumonia and tdap Recently had eye exam which showed increased ocular pressure leading to neurology referral with brain MRI and LP  Obesity- stress eating. Interested in talking to a nutritionist no activity outside of work. Stands but stationary at work.  Had sleeve in 2016. Dr. [name], [place] [facility] Give workout information. 2017 was 213. 305lbs at heaviest  STD- having heavy white discharge a week or two. Boyfriend is only partner and he is asymptomatic. No pruritus. No dysuria. LMP was about a month ago. BTL- no condom use.  HTN- was on metoprolol 25mg  prior to weight loss.  OBJECTIVE:   BP (!) 142/80   Pulse (!) 124   Ht 5\' 2"  (1.575 m)   Wt (!) 300 lb 12.8 oz (136.4 kg)   SpO2 99%   BMI 55.02 kg/m   General: NAD, pleasant,obese, able to participate in exam Cardiac: RRR, normal heart sounds, no murmurs. 2+ radial and PT pulses bilaterally Respiratory: CTAB, normal effort, No wheezes, rales or rhonchi Pelvic: normal external genitalia with thick white discharge Extremities: no edema. WWP. Feet- sensation and strength intact, pain to palpation of plantar fascia Skin: warm and dry, right foot has severe tinea pedis Neuro: alert and oriented, no focal deficits  ASSESSMENT/PLAN:   Tachycardia Currently asymptomatic  Hypertension, benign Had been resolved and without medication with weight loss after gastric sleeve but has had multiple in office reading of elevated pressures with weight gain again. - prescribe metoprolol 25mg  daily which she  was on prior and tolerated well. Could also improve tachycardia - follow up in 2 weeks  Plantar fasciitis Given home maintenance and treatment information  Tinea pedis Prescribed terbinafine Follow up if not improving in 2 weeks  Morbid obesity ([place]) Previously had gastric sleeve but has gained weight again.  Gave patient information for nutritionist referral Gave her exercise and diet recommendations Refer back to [place] for discussion on repeat bariatric procedures   Screen for STD (sexually transmitted disease) Wet prep and gc/chlamydia with pap smear     [place]

## 2020-12-03 NOTE — Patient Instructions (Addendum)
It was a pleasure to see you today!  To summarize our discussion for this visit:  We are catching up on some health maintenance measures today including a pneumonia and Tdap vaccine. I collected a pap smear and some blood work today as well.  For your elevated blood pressure and heart rate- we are going to start back on metoprolol 25mg  and I'd like to see you in about 2 weeks to monitor your response to the medication and dose.   I will message you with the results of your labs via mychart   I sent in a medication to use for your athletes foot. Let me know if it is not clearing up with that.  For your plantar fasciitis, you can use naproxen and a topical antiinflammatory called voltaren gel that you can get over the counter. Also use a frozen water bottle to roll under your feet.   Weight loss- please call to set up an appointment with our nutritionist. I would also highly recommend increasing your activity level to at least 30 minutes per day even if it is broken up into smaller chunks  Some additional health maintenance measures we should update are: Health Maintenance Due  Topic Date Due  . PNEUMOCOCCAL POLYSACCHARIDE VACCINE AGE 48-64 HIGH RISK  Never done  . FOOT EXAM  Never done  . OPHTHALMOLOGY EXAM  Never done  . COVID-19 Vaccine (1) Never done  . TETANUS/TDAP  Never done  . PAP SMEAR-Modifier  04/02/2020  . HEMOGLOBIN A1C  06/04/2020  . INFLUENZA VACCINE  07/29/2020  . URINE MICROALBUMIN  08/09/2020  .    Please return to our clinic to see me in 2 weeks.  Call the clinic at [phone] if your symptoms worsen or you have any concerns.   Thank you for allowing me to take part in your care,  Dr. Doristine Mango    Plantar Fasciitis  Plantar fasciitis is a painful foot condition that affects the heel. It occurs when the band of tissue that connects the toes to the heel bone (plantar fascia) becomes irritated. This can happen as the result of exercising too much or  doing other repetitive activities (overuse injury). The pain from plantar fasciitis can range from mild irritation to severe pain that makes it difficult to walk or move. The pain is usually worse in the morning after sleeping, or after sitting or lying down for a while. Pain may also be worse after long periods of walking or standing. What are the causes? This condition may be caused by:  Standing for long periods of time.  Wearing shoes that do not have good arch support.  Doing activities that put stress on joints (high-impact activities), including running, aerobics, and ballet.  Being overweight.  An abnormal way of walking (gait).  Tight muscles in the back of your lower leg (calf).  High arches in your feet.  Starting a new athletic activity. What are the signs or symptoms? The main symptom of this condition is heel pain. Pain may:  Be worse with first steps after a time of rest, especially in the morning after sleeping or after you have been sitting or lying down for a while.  Be worse after long periods of standing still.  Decrease after 30-45 minutes of activity, such as gentle walking. How is this diagnosed? This condition may be diagnosed based on your medical history and your symptoms. Your health care provider may ask questions about your activity level. Your health care provider  will do a physical exam to check for:  A tender area on the bottom of your foot.  A high arch in your foot.  Pain when you move your foot.  Difficulty moving your foot. You may have imaging tests to confirm the diagnosis, such as:  X-rays.  Ultrasound.  MRI. How is this treated? Treatment for plantar fasciitis depends on how severe your condition is. Treatment may include:  Rest, ice, applying pressure (compression), and raising the affected foot (elevation). This may be called RICE therapy. Your health care provider may recommend RICE therapy along with over-the-counter pain  medicines to manage your pain.  Exercises to stretch your calves and your plantar fascia.  A splint that holds your foot in a stretched, upward position while you sleep (night splint).  Physical therapy to relieve symptoms and prevent problems in the future.  Injections of steroid medicine (cortisone) to relieve pain and inflammation.  Stimulating your plantar fascia with electrical impulses (extracorporeal shock wave therapy). This is usually the last treatment option before surgery.  Surgery, if other treatments have not worked after 12 months. Follow these instructions at home:  Managing pain, stiffness, and swelling  If directed, put ice on the painful area: ? Put ice in a plastic bag, or use a frozen bottle of water. ? Place a towel between your skin and the bag or bottle. ? Roll the bottom of your foot over the bag or bottle. ? Do this for 20 minutes, 2-3 times a day.  Wear athletic shoes that have air-sole or gel-sole cushions, or try wearing soft shoe inserts that are designed for plantar fasciitis.  Raise (elevate) your foot above the level of your heart while you are sitting or lying down. Activity  Avoid activities that cause pain. Ask your health care provider what activities are safe for you.  Do physical therapy exercises and stretches as told by your health care provider.  Try activities and forms of exercise that are easier on your joints (low-impact). Examples include swimming, water aerobics, and biking. General instructions  Take over-the-counter and prescription medicines only as told by your health care provider.  Wear a night splint while sleeping, if told by your health care provider. Loosen the splint if your toes tingle, become numb, or turn cold and blue.  Maintain a healthy weight, or work with your health care provider to lose weight as needed.  Keep all follow-up visits as told by your health care provider. This is important. Contact a health  care provider if you:  Have symptoms that do not go away after caring for yourself at home.  Have pain that gets worse.  Have pain that affects your ability to move or do your daily activities. Summary  Plantar fasciitis is a painful foot condition that affects the heel. It occurs when the band of tissue that connects the toes to the heel bone (plantar fascia) becomes irritated.  The main symptom of this condition is heel pain that may be worse after exercising too much or standing still for a long time.  Treatment varies, but it usually starts with rest, ice, compression, and elevation (RICE therapy) and over-the-counter medicines to manage pain. This information is not intended to replace advice given to you by your health care provider. Make sure you discuss any questions you have with your health care provider. Document Revised: 11/27/2017 Document Reviewed: 10/12/2017 Elsevier Patient Education  2020 [employer].

## 2020-12-03 NOTE — Assessment & Plan Note (Signed)
Currently asymptomatic 

## 2020-12-04 LAB — BASIC METABOLIC PANEL
BUN/Creatinine Ratio: 14 (ref 9–23)
BUN: 11 mg/dL (ref 6–20)
CO2: 22 mmol/L (ref 20–29)
Calcium: 9.8 mg/dL (ref 8.7–10.2)
Chloride: 102 mmol/L (ref 96–106)
Creatinine, Ser: 0.8 mg/dL (ref 0.57–1.00)
GFR calc Af Amer: 108 mL/min/{1.73_m2} (ref >59)
GFR calc non Af Amer: 94 mL/min/{1.73_m2} (ref >59)
Glucose: 85 mg/dL (ref 65–99)
Potassium: 4.1 mmol/L (ref 3.5–5.2)
Sodium: 137 mmol/L (ref 134–144)

## 2020-12-04 LAB — CERVICOVAGINAL ANCILLARY ONLY
Chlamydia: NEGATIVE
Comment: NEGATIVE
Comment: NORMAL
Neisseria Gonorrhea: NEGATIVE

## 2020-12-04 LAB — LIPID PANEL
Chol/HDL Ratio: 3.6 ratio (ref 0.0–4.4)
Cholesterol, Total: 147 mg/dL (ref 100–199)
HDL: 41 mg/dL (ref >39)
LDL Chol Calc (NIH): 87 mg/dL (ref 0–99)
Triglycerides: 105 mg/dL (ref 0–149)
VLDL Cholesterol Cal: 19 mg/dL (ref 5–40)

## 2020-12-04 LAB — CBC
Hematocrit: 37.6 % (ref 34.0–46.6)
Hemoglobin: 12.1 g/dL (ref 11.1–15.9)
MCH: 26.7 pg (ref 26.6–33.0)
MCHC: 32.2 g/dL (ref 31.5–35.7)
MCV: 83 fL (ref 79–97)
Platelets: 416 10*3/uL (ref 150–450)
RBC: 4.54 x10E6/uL (ref 3.77–5.28)
RDW: 14.7 % (ref 11.7–15.4)
WBC: 11.2 10*3/uL — ABNORMAL HIGH (ref 3.4–10.8)

## 2020-12-04 LAB — HEMOGLOBIN A1C
Est. average glucose Bld gHb Est-mCnc: 126 mg/dL
Hgb A1c MFr Bld: 6 % — ABNORMAL HIGH (ref 4.8–5.6)

## 2020-12-05 DIAGNOSIS — B353 Tinea pedis: Secondary | ICD-10-CM | POA: Insufficient documentation

## 2020-12-05 DIAGNOSIS — Z113 Encounter for screening for infections with a predominantly sexual mode of transmission: Secondary | ICD-10-CM | POA: Insufficient documentation

## 2020-12-05 DIAGNOSIS — M722 Plantar fascial fibromatosis: Secondary | ICD-10-CM | POA: Insufficient documentation

## 2020-12-05 LAB — CYTOLOGY - PAP
Comment: NEGATIVE
Diagnosis: NEGATIVE
High risk HPV: NEGATIVE

## 2020-12-05 NOTE — Assessment & Plan Note (Signed)
Previously had gastric sleeve but has gained weight again.  Gave patient information for nutritionist referral Gave her exercise and diet recommendations Refer back to [place] for discussion on repeat bariatric procedures

## 2020-12-05 NOTE — Assessment & Plan Note (Signed)
Prescribed terbinafine Follow up if not improving in 2 weeks

## 2020-12-05 NOTE — Assessment & Plan Note (Signed)
Wet prep and gc/chlamydia with pap smear

## 2020-12-05 NOTE — Assessment & Plan Note (Signed)
Given home maintenance and treatment information

## 2020-12-05 NOTE — Assessment & Plan Note (Signed)
Had been resolved and without medication with weight loss after gastric sleeve but has had multiple in office reading of elevated pressures with weight gain again. - prescribe metoprolol 25mg  daily which she was on prior and tolerated well. Could also improve tachycardia - follow up in 2 weeks

## 2020-12-06 ENCOUNTER — Other Ambulatory Visit: Payer: Self-pay | Admitting: Student in an Organized Health Care Education/Training Program

## 2020-12-06 MED ORDER — METRONIDAZOLE 500 MG PO TABS: 500.0000 mg | ORAL_TABLET | Freq: Two times a day (BID) | ORAL | 0 refills | Status: AC

## 2020-12-07 ENCOUNTER — Encounter: Payer: Self-pay | Admitting: Student in an Organized Health Care Education/Training Program

## 2020-12-10 NOTE — Telephone Encounter (Signed)
It appears that metoprolol was discussed at [place] on 12/03/2020, however, I do not see where it got sent to the pharmacy.   To PCP  Talbot Grumbling, RN

## 2020-12-11 ENCOUNTER — Other Ambulatory Visit: Payer: Self-pay | Admitting: Student in an Organized Health Care Education/Training Program

## 2020-12-11 MED ORDER — METOPROLOL SUCCINATE ER 25 MG PO TB24: 25.0000 mg | ORAL_TABLET | Freq: Every day | ORAL | 0 refills | Status: DC

## 2020-12-11 NOTE — Progress Notes (Signed)
Sent in metorpolol 20mg  daily Treatment for trich and BV was sent on 12/9 Patient has been Nature conservation officer via [employer]

## 2020-12-17 ENCOUNTER — Ambulatory Visit: Payer: Medicaid Other | Admitting: Student in an Organized Health Care Education/Training Program

## 2021-01-08 NOTE — Progress Notes (Deleted)
No chief complaint on file.   HISTORICAL  Melinda Ruiz is a 40 year old female, seen in request by primary care physician Dr. Ouida Sills, [facility] for evaluation of migraine headaches, optic papillitis,  I reviewed and summarized the referring note.  Past medical history Bipolar disorder Diabetes Hypertension Kidney stone  She complains of frequent left-sided headache, starting from left occipital region, running down her left shoulder, to her left arm, intermittent, lasting for couple hours, and happens 3-4 times each month, with associated left-sided blurry vision,  Examination from ophthalmologist [facility] by Dr. Gevena Cotton showed irregular margin elevation he, anteriorles/venules tortuosity abnormality.  She has tried over-the-counter Tylenol with limited help, also complains of left-sided blurry vision  Update January 09, 2021 SS: Laboratory evaluation shows significantly elevated ESR 92, CRP 35, normal CMP, CBC in October 02, 2020; repeat October 09, 2020 only mildly elevated CRP 15, ESR 34.  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  LP in October 2021 showed elevated opening pressure of 40 cm water, consistent with pseudotumor cerebri, continue Topamax 100 mg twice a day.  REVIEW OF SYSTEMS: Full 14 system review of systems performed and notable only for as above All other review of systems were negative.  ALLERGIES: Allergies  Allergen Reactions  . Ibuprofen Itching  . Penicillins Hives    .Has patient had a PCN reaction causing immediate rash, facial/tongue/throat swelling, SOB or lightheadedness with hypotension: No Has patient had a PCN reaction causing severe rash involving mucus membranes or skin necrosis: No Has patient had a PCN reaction that required hospitalization No Has patient had a PCN reaction occurring within the last 10 years:  No If all of the above answers are "NO", then may proceed with Cephalosporin use.  Marland Kitchen Oxycodone-Acetaminophen Itching    HOME MEDICATIONS: Current Outpatient Medications  Medication Sig Dispense Refill  . acetaminophen (TYLENOL) 500 MG tablet Take 500 mg by mouth every 6 (six) hours as needed.    . methocarbamol (ROBAXIN) 500 MG tablet Take 1 tablet (500 mg total) by mouth every 8 (eight) hours as needed for muscle spasms. 30 tablet 0  . metoprolol succinate (TOPROL-XL) 25 MG 24 hr tablet Take 1 tablet (25 mg total) by mouth at bedtime. 90 tablet 0  . ondansetron (ZOFRAN) 4 MG tablet Take 1 tablet (4 mg total) by mouth every 8 (eight) hours as needed for nausea or vomiting. 20 tablet 0  . pantoprazole (PROTONIX) 40 MG tablet TAKE 1 TABLET BY MOUTH EVERY DAY 90 tablet 0  . sucralfate (CARAFATE) 1 g tablet TAKE 1 TABLET BY MOUTH EVERY 6 HOURS AS NEEDED FOR ULCERS. Can crush and mix with water or applesauce if needed.    . SUMAtriptan (IMITREX) 100 MG tablet Take 1 tablet (100 mg total) by mouth once as needed for up to 1 dose for migraine. May repeat in 2 hours if headache persists or recurs. 12 tablet 6  . terbinafine (LAMISIL) 1 % cream Apply 1 application topically 2 (two) times daily. 30 g 0  . topiramate (TOPAMAX) 100 MG tablet Take 1 tablet (100 mg total) by mouth 2 (two) times daily. 60 tablet 11   No current facility-administered medications for this visit.    PAST MEDICAL HISTORY: Past Medical History:  Diagnosis Date  . Acid reflux   . Anemia, during pregnancy   . Bipolar disorder ([place])   . 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 Complication of anesthesia    "Oxygen level drops"  . Depression   . Diabetes mellitus without complication ([place])   . Fibroid   . Gallbladder problem   . Hypertension   . Kidney stones 2010  . Lactose intolerance   . Migraine   . Osteoarthritis   . Schizophrenia ([place])   . Sleep apnea   . Ulcer of abdomen wall ([place])     PAST SURGICAL HISTORY: Past  Surgical History:  Procedure Laterality Date  . BREATH TEK H PYLORI N/A 05/31/2015   Procedure: BREATH TEK H PYLORI;  Surgeon: Greer Pickerel, MD;  Location: [facility];  Service: General;  Laterality: N/A;  . CARPAL TUNNEL RELEASE  02/03/2012   Procedure: CARPAL TUNNEL RELEASE;  Surgeon: Meredith Pel, MD;  Location: [place];  Service: Orthopedics;  Laterality: Left;  left carpal tunnel release  . CARPAL TUNNEL RELEASE  01/11/2013   Procedure: CARPAL TUNNEL RELEASE;  Surgeon: Meredith Pel, MD;  Location: [facility];  Service: Orthopedics;  Laterality: Right;  Hand Set, Small Retractors, Lead Hand  . CESAREAN SECTION  2006, 2011  . CHOLECYSTECTOMY  06/2011  . cystectomy    . GANGLION CYST EXCISION  02/03/2012   Procedure: REMOVAL GANGLION OF WRIST;  Surgeon: Meredith Pel, MD;  Location: [place];  Service: Orthopedics;  Laterality: Left;  left dorsal ganglion cyst excision  . LAPAROSCOPIC GASTRIC SLEEVE RESECTION N/A 10/02/2015   Procedure: LAPAROSCOPIC GASTRIC SLEEVE RESECTION;  Surgeon: Greer Pickerel, MD;  Location: [facility];  Service: General;  Laterality: N/A;  . Lymp Glands Removed--under arm bil    . Lympectomy    . MULTIPLE TOOTH EXTRACTIONS  2003   and wisdom teeth  . Pilonidial Cyst Removed  1990's  . TUBAL LIGATION  2011  . UPPER GI ENDOSCOPY  10/02/2015   Procedure: UPPER GI ENDOSCOPY;  Surgeon: Greer Pickerel, MD;  Location: [facility];  Service: General;;    FAMILY HISTORY: Family History  Problem Relation Age of Onset  . Kidney cancer Father   . Diabetes Mellitus II Father   . Hypertension Father   . Arthritis Mother   . Diabetes Mellitus II Mother   . Hypertension Mother   . Diabetes Mother   . Anxiety disorder Mother   . Anesthesia problems Neg Hx     SOCIAL HISTORY: Social History   Socioeconomic History  . Marital status: Single    Spouse name: Not on file  . Number of children: 2  . Years of education: 55  . Highest education level: High school graduate   Occupational History  . Occupation: Dealer  Tobacco Use  . Smoking status: Former Smoker    Packs/day: 0.25    Types: Cigarettes    Quit date: 10/01/2009    Years since quitting: 11.2  . Smokeless tobacco: Never Used  Vaping Use  . Vaping Use: Never used  Substance and Sexual Activity  . Alcohol use: Yes    Comment: has a drink twice weekly  . Drug use: No  . Sexual activity: Yes    Birth control/protection: Pill  Other Topics Concern  . Not on file  Social History Narrative   Lives at home with children.   Right-handed.   No daily caffeine use.   Social Determinants of Health   Financial Resource Strain: Not on file  Food Insecurity: Not on file  Transportation Needs: Not on file  Physical Activity: Not on file  Stress:  Not on file  Social Connections: Not on file  Intimate Partner Violence: Not on file     PHYSICAL EXAM   There were no vitals filed for this visit. Not recorded     There is no height or weight on file to calculate BMI.  PHYSICAL EXAMNIATION:  Gen: NAD, conversant, well nourised, well groomed                     Cardiovascular: Regular rate rhythm, no peripheral edema, warm, nontender. Eyes: Conjunctivae clear without exudates or hemorrhage Neck: Supple, no carotid bruits. Pulmonary: Clear to auscultation bilaterally   NEUROLOGICAL EXAM: Obesity  MENTAL STATUS: Speech:    Speech is normal; fluent and spontaneous with normal comprehension.  Cognition:     Orientation to time, place and person     Normal recent and remote memory     Normal Attention span and concentration     Normal Language, naming, repeating,spontaneous speech     Fund of knowledge   CRANIAL NERVES: CN II: Visual fields are full to confrontation. Pupils are round equal and briskly reactive to light. 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 CN V: Facial sensation is intact to light touch CN VII: Face is symmetric  with normal eye closure  CN VIII: Hearing is normal to causal conversation. CN IX, X: Phonation is normal. CN XI: Head turning and shoulder shrug are intact  MOTOR: There is no pronator drift of out-stretched arms. Muscle bulk and tone are normal. Muscle strength is normal.  REFLEXES: Reflexes are 2+ and symmetric at the biceps, triceps, knees, and ankles. Plantar responses are flexor.  SENSORY: Intact to light touch, pinprick and vibratory sensation are intact in fingers and toes.  COORDINATION: There is no trunk or limb dysmetria noted.  GAIT/STANCE: Posture is normal. Gait is steady with normal steps, base, arm swing, and turning. Heel and toe walking are normal. Tandem gait is normal.  Romberg is absent.   DIAGNOSTIC DATA (LABS, IMAGING, TESTING) - I reviewed patient records, labs, notes, testing and imaging myself where available.   ASSESSMENT AND PLAN  KAMEKO HUKILL is a 40 y.o. female    Chronic migraine headaches Obesity Pseudotumor cerebri -MRI of the brain showed 9 to 10 mm cerebellar ectopia, consistent with Arnold-Chiari 1 malformation, no acute abnormality -Lumbar pressure revealed elevated opening pressure of 40 cm water -Laboratory evaluation shows significantly elevated ESR 92, CRP 35, normal CMP, CBC in October 02, 2020; repeat October 09, 2020 only mildly elevated CRP 15, ESR 34.      Marcial Pacas, M.D. Ph.D.  [facility] [address], [place], [place] [address] Ph: [phone] Fax: [phone]  CC:  Anderson, Chelsey L, DO [place] [address] [place],  [place] [address]

## 2021-01-09 ENCOUNTER — Ambulatory Visit: Payer: Medicare Other | Admitting: Neurology

## 2021-01-09 ENCOUNTER — Encounter: Payer: Self-pay | Admitting: Neurology

## 2021-01-22 ENCOUNTER — Ambulatory Visit: Payer: Medicaid Other

## 2021-01-24 ENCOUNTER — Ambulatory Visit: Payer: Medicaid Other | Attending: Internal Medicine

## 2021-01-24 DIAGNOSIS — Z23 Encounter for immunization: Secondary | ICD-10-CM

## 2021-01-24 NOTE — Progress Notes (Signed)
   Covid-19 Vaccination Clinic  Name:  Melinda Ruiz    MRN: [id number] DOB: October 13, 1981  01/24/2021  [name] was observed post Covid-19 immunization for 30 minutes based on pre-vaccination screening without incident. She was provided with Vaccine Information Sheet and instruction to access the V-Safe system.   [name] was instructed to call 911 with any severe reactions post vaccine: Marland Kitchen Difficulty breathing  . Swelling of face and throat  . A fast heartbeat  . A bad rash all over body  . Dizziness and weakness   Immunizations Administered    Name Date Dose VIS Date Route   Moderna COVID-19 Vaccine 01/24/2021  3:07 PM 0.5 mL 10/17/2020 Intramuscular   Manufacturer: Moderna   Lot: [id number]   [place]: [id number]

## 2021-02-19 ENCOUNTER — Ambulatory Visit: Payer: Medicaid Other

## 2021-02-28 ENCOUNTER — Ambulatory Visit: Payer: Medicaid Other

## 2021-03-01 ENCOUNTER — Ambulatory Visit: Payer: Medicaid Other | Attending: Internal Medicine

## 2021-03-01 DIAGNOSIS — Z23 Encounter for immunization: Secondary | ICD-10-CM

## 2021-03-01 NOTE — Progress Notes (Signed)
   Covid-19 Vaccination Clinic  Name:  Melinda Ruiz    MRN: [id number] DOB: [birth year]  03/01/2021  [name] was observed post Covid-19 immunization for 15 minutes without incident. She was provided with Vaccine Information Sheet and instruction to access the V-Safe system.   [name] was instructed to call 911 with any severe reactions post vaccine: Marland Kitchen Difficulty breathing  . Swelling of face and throat  . A fast heartbeat  . A bad rash all over body  . Dizziness and weakness   Immunizations Administered    Name Date Dose VIS Date Route   Moderna COVID-19 Vaccine 03/01/2021  1:06 PM 0.5 mL 10/17/2020 Intramuscular   Manufacturer: Moderna   Lot: [id number]   [place]: [phone]

## 2021-04-24 ENCOUNTER — Other Ambulatory Visit: Payer: Self-pay

## 2021-04-24 ENCOUNTER — Ambulatory Visit
Admission: EM | Admit: 2021-04-24 | Discharge: 2021-04-24 | Disposition: A | Discharge status: Home / Self Care | Payer: Medicaid Other | Attending: Family Medicine | Admitting: Family Medicine

## 2021-04-24 ENCOUNTER — Encounter: Payer: Self-pay | Admitting: Emergency Medicine

## 2021-04-24 ENCOUNTER — Ambulatory Visit: Payer: Medicaid Other | Admitting: Family Medicine

## 2021-04-24 DIAGNOSIS — N611 Abscess of the breast and nipple: Secondary | ICD-10-CM | POA: Diagnosis not present

## 2021-04-24 MED ORDER — HIBICLENS 4 % EX LIQD: Freq: Every day | CUTANEOUS | 0 refills | Status: DC | PRN

## 2021-04-24 MED ORDER — SULFAMETHOXAZOLE-TRIMETHOPRIM 800-160 MG PO TABS: 1.0000 | ORAL_TABLET | Freq: Two times a day (BID) | ORAL | 0 refills | Status: DC

## 2021-04-24 NOTE — ED Triage Notes (Signed)
Pt here with abscess under breast in center to right side x 1 week; denies drainage

## 2021-04-24 NOTE — ED Provider Notes (Signed)
[facility]    CSN: [id number] Arrival date & time: 04/24/21  2440      History   Chief Complaint Chief Complaint  Patient presents with  . Abscess    HPI Melinda Ruiz is a 40 y.o. female.   Patient presenting today with 3 to 4-day history of painful swollen area under right breast.  She states she has been using warm compresses to the area with no benefit.  The area is red, tender, firm and not draining.  Does have a history of recurrent boils but usually gets them in the underarm area and has had surgery for this in the past.  Has not been taking anything over-the-counter for pain or swelling.  No injury to the area prior to onset.     Past Medical History:  Diagnosis Date  . Acid reflux   . Anemia, during pregnancy   . Bipolar disorder ([place])   . Chiari I malformation ([place])   . Complication of anesthesia    "Oxygen level drops"  . Depression   . Diabetes mellitus without complication ([place])   . Fibroid   . Gallbladder problem   . Hypertension   . Kidney stones 2010  . Lactose intolerance   . Migraine   . Osteoarthritis   . Schizophrenia ([place])   . Sleep apnea   . Ulcer of abdomen wall [facility])     Patient Active Problem List   Diagnosis Date Noted  . Plantar fasciitis 12/05/2020  . Tinea pedis 12/05/2020  . Screen for STD (sexually transmitted disease) 12/05/2020  . Tachycardia 12/03/2020  . Abnormal laboratory test 10/05/2020  . Morbid obesity ([place]) 10/02/2020  . Blurry vision, bilateral 10/02/2020  . Carpal tunnel syndrome of left wrist 09/02/2019  . Depression 01/17/2019  . Trichimoniasis 09/09/2018  . Chronic migraine 07/30/2017  . Chiari malformation type I ([place]) 07/30/2017  . Thyromegaly 04/09/2016  . Oropharyngeal dysphagia 04/09/2016  . Multiple thyroid nodules 04/09/2016  . Gastroesophageal reflux disease 04/09/2016  . Diabetes mellitus type 2 in obese ([place]) 10/02/2015  . Low HDL (under 40) 10/02/2015  . S/P laparoscopic  sleeve gastrectomy 2016 with Dr. [name], considering revision bypass due to severe reflux 10/02/2015  . Dysfunctional uterine bleeding 06/19/2014  . Depo-Provera contraceptive status 06/19/2014  . Hypertension, benign 06/08/2013  . Menorrhagia 06/08/2013    Past Surgical History:  Procedure Laterality Date  . BREATH TEK H PYLORI N/A 05/31/2015   Procedure: BREATH TEK H PYLORI;  Surgeon: Greer Pickerel, MD;  Location: [facility];  Service: General;  Laterality: N/A;  . CARPAL TUNNEL RELEASE  02/03/2012   Procedure: CARPAL TUNNEL RELEASE;  Surgeon: Meredith Pel, MD;  Location: [place];  Service: Orthopedics;  Laterality: Left;  left carpal tunnel release  . CARPAL TUNNEL RELEASE  01/11/2013   Procedure: CARPAL TUNNEL RELEASE;  Surgeon: Meredith Pel, MD;  Location: [place];  Service: Orthopedics;  Laterality: Right;  Hand Set, Small Retractors, Lead Hand  . CESAREAN SECTION  2006, 2011  . CHOLECYSTECTOMY  06/2011  . cystectomy    . GANGLION CYST EXCISION  02/03/2012   Procedure: REMOVAL GANGLION OF WRIST;  Surgeon: Meredith Pel, MD;  Location: [place];  Service: Orthopedics;  Laterality: Left;  left dorsal ganglion cyst excision  . LAPAROSCOPIC GASTRIC SLEEVE RESECTION N/A 10/02/2015   Procedure: LAPAROSCOPIC GASTRIC SLEEVE RESECTION;  Surgeon: Greer Pickerel, MD;  Location: [facility];  Service: General;  Laterality: N/A;  . Lymp Glands Removed--under arm bil    .  Lympectomy    . MULTIPLE TOOTH EXTRACTIONS  2003   and wisdom teeth  . Pilonidial Cyst Removed  1990's  . TUBAL LIGATION  2011  . UPPER GI ENDOSCOPY  10/02/2015   Procedure: UPPER GI ENDOSCOPY;  Surgeon: Greer Pickerel, MD;  Location: [facility];  Service: General;;    OB History    Gravida  3   Para  2   Term  2   Preterm  0   AB  1   Living  2     SAB  1   IAB  0   Ectopic  0   Multiple  0   Live Births  2            Home Medications    Prior to Admission medications   Medication Sig Start Date End Date  Taking? Authorizing Provider  chlorhexidine (HIBICLENS) 4 % external liquid Apply topically daily as needed. 04/24/21  Yes Volney American, PA-C  sulfamethoxazole-trimethoprim (BACTRIM DS) 800-160 MG tablet Take 1 tablet by mouth 2 (two) times daily. 04/24/21  Yes Volney American, PA-C  acetaminophen (TYLENOL) 500 MG tablet Take 500 mg by mouth every 6 (six) hours as needed.    [provider]  methocarbamol (ROBAXIN) 500 MG tablet Take 1 tablet (500 mg total) by mouth every 8 (eight) hours as needed for muscle spasms. 09/11/20   Hayden Rasmussen, MD  metoprolol succinate (TOPROL-XL) 25 MG 24 hr tablet Take 1 tablet (25 mg total) by mouth at bedtime. 12/11/20   Anderson, Chelsey L, DO  ondansetron (ZOFRAN) 4 MG tablet Take 1 tablet (4 mg total) by mouth every 8 (eight) hours as needed for nausea or vomiting. 10/18/20   Marcial Pacas, MD  pantoprazole (PROTONIX) 40 MG tablet TAKE 1 TABLET BY MOUTH EVERY DAY 10/21/19   Nelida Meuse III, MD  sucralfate (CARAFATE) 1 g tablet TAKE 1 TABLET BY MOUTH EVERY 6 HOURS AS NEEDED FOR ULCERS. Can crush and mix with water or applesauce if needed. 02/25/16   [provider]  SUMAtriptan (IMITREX) 100 MG tablet Take 1 tablet (100 mg total) by mouth once as needed for up to 1 dose for migraine. May repeat in 2 hours if headache persists or recurs. 10/02/20   Marcial Pacas, MD  terbinafine (LAMISIL) 1 % cream Apply 1 application topically 2 (two) times daily. 12/03/20   Anderson, Chelsey L, DO  topiramate (TOPAMAX) 100 MG tablet Take 1 tablet (100 mg total) by mouth 2 (two) times daily. 10/02/20   Marcial Pacas, MD    Family History Family History  Problem Relation Age of Onset  . Kidney cancer Father   . Diabetes Mellitus II Father   . Hypertension Father   . Arthritis Mother   . Diabetes Mellitus II Mother   . Hypertension Mother   . Diabetes Mother   . Anxiety disorder Mother   . Anesthesia problems Neg Hx     Social History Social  History   Tobacco Use  . Smoking status: Former Smoker    Packs/day: 0.25    Types: Cigarettes    Quit date: 10/01/2009    Years since quitting: 11.5  . Smokeless tobacco: Never Used  Vaping Use  . Vaping Use: Never used  Substance Use Topics  . Alcohol use: Yes    Comment: has a drink twice weekly  . Drug use: No     Allergies   Ibuprofen, Penicillins, and Oxycodone-acetaminophen   Review of  Systems Review of Systems Per HPI  Physical Exam Triage Vital Signs ED Triage Vitals  Enc Vitals Group     BP 04/24/21 1043 138/79     Pulse Rate 04/24/21 1043 99     Resp 04/24/21 1043 18     Temp 04/24/21 1043 98.1 F (36.7 C)     Temp Source 04/24/21 1043 Oral     SpO2 04/24/21 1043 98 %     Weight --      Height --      Head Circumference --      Peak Flow --      Pain Score 04/24/21 1044 7     Pain Loc --      Pain Edu? --      Excl. in [place]? --    No data found.  Updated Vital Signs BP 138/79 (BP Location: Left Arm)   Pulse 99   Temp 98.1 F (36.7 C) (Oral)   Resp 18   SpO2 98%   Visual Acuity Right Eye Distance:   Left Eye Distance:   Bilateral Distance:    Right Eye Near:   Left Eye Near:    Bilateral Near:     Physical Exam Vitals and nursing note reviewed.  Constitutional:      Appearance: Normal appearance. She is not ill-appearing.  HENT:     Head: Atraumatic.     Mouth/Throat:     Mouth: Mucous membranes are moist.     Pharynx: Oropharynx is clear.  Eyes:     Extraocular Movements: Extraocular movements intact.     Conjunctiva/sclera: Conjunctivae normal.  Cardiovascular:     Rate and Rhythm: Normal rate and regular rhythm.     Heart sounds: Normal heart sounds.  Pulmonary:     Effort: Pulmonary effort is normal. No respiratory distress.     Breath sounds: Normal breath sounds. No wheezing or rales.  Abdominal:     General: Bowel sounds are normal. There is no distension.     Palpations: Abdomen is soft.     Tenderness: There is no  abdominal tenderness. There is no guarding.  Musculoskeletal:        General: Normal range of motion.     Cervical back: Normal range of motion and neck supple.  Skin:    General: Skin is warm and dry.     Comments: 2 to 3 cm oblong firm erythematous tender region at base of right breast.  No induration, fluctuance, active drainage  Neurological:     Mental Status: She is alert and oriented to person, place, and time.  Psychiatric:        Mood and Affect: Mood normal.        Thought Content: Thought content normal.        Judgment: Judgment normal.      UC Treatments / Results  Labs (all labs ordered are listed, but only abnormal results are displayed) Labs Reviewed - No data to display  EKG   Radiology No results found.  Procedures Procedures (including critical care time)  Medications Ordered in UC Medications - No data to display  Initial Impression / Assessment and Plan / UC Course  I have reviewed the triage vital signs and the nursing notes.  Pertinent labs & imaging results that were available during my care of the patient were reviewed by me and considered in my medical decision making (see chart for details).     Cellulitis versus early abscess.  Will treat  with 10-day course of Bactrim, Hibiclens, warm compresses.  Follow-up with primary care next week for a recheck of the area.  Over-the-counter pain relievers as needed  Final Clinical Impressions(s) / UC Diagnoses   Final diagnoses:  Breast abscess   Discharge Instructions   None    ED Prescriptions    Medication Sig Dispense Auth. Provider   sulfamethoxazole-trimethoprim (BACTRIM DS) 800-160 MG tablet Take 1 tablet by mouth 2 (two) times daily. 20 tablet Volney American, [place]   chlorhexidine (HIBICLENS) 4 % external liquid Apply topically daily as needed. 120 mL Volney American, [place]     PDMP not reviewed this encounter.   Volney American, [place] 04/24/21 1313

## 2021-04-25 ENCOUNTER — Ambulatory Visit
Admission: EM | Admit: 2021-04-25 | Discharge: 2021-04-25 | Disposition: A | Discharge status: Home / Self Care | Payer: Medicaid Other | Attending: Nurse Practitioner | Admitting: Nurse Practitioner

## 2021-04-25 DIAGNOSIS — N611 Abscess of the breast and nipple: Secondary | ICD-10-CM | POA: Diagnosis not present

## 2021-04-25 DIAGNOSIS — T370X1A Poisoning by sulfonamides, accidental (unintentional), initial encounter: Secondary | ICD-10-CM

## 2021-04-25 DIAGNOSIS — Z889 Allergy status to unspecified drugs, medicaments and biological substances status: Secondary | ICD-10-CM

## 2021-04-25 MED ORDER — DEXAMETHASONE SODIUM PHOSPHATE 10 MG/ML IJ SOLN
10.0000 mg | Freq: Once | INTRAMUSCULAR | Status: AC
  Administered 2021-04-25: 10 mg via INTRAMUSCULAR

## 2021-04-25 MED ORDER — PREDNISONE 10 MG (21) PO TBPK: ORAL_TABLET | Freq: Every day | ORAL | 0 refills | Status: DC

## 2021-04-25 MED ORDER — CLINDAMYCIN HCL 150 MG PO CAPS: 450.0000 mg | ORAL_CAPSULE | Freq: Three times a day (TID) | ORAL | 0 refills | Status: AC

## 2021-04-25 MED ORDER — EPINEPHRINE 0.3 MG/0.3ML IJ SOAJ: 0.3000 mg | INTRAMUSCULAR | 0 refills | Status: DC | PRN

## 2021-04-25 NOTE — ED Triage Notes (Signed)
Pt states was given bactrim DS yesterday here for a boil. States took one this afternoon and has been itching every since. States hasn't taken anything for it.

## 2021-04-25 NOTE — ED Provider Notes (Signed)
[facility]    CSN: [id number] Arrival date & time: 04/25/21  1824      History   Chief Complaint Chief Complaint  Patient presents with  . Allergic Reaction    HPI Melinda Ruiz is a 40 y.o. female.   History of Present Illness   Melinda Ruiz is a 40 y.o. female that presents for evaluation of itchy throat and generalized skin itching. Onset of symptoms was abrupt starting 10 minutes after taking the first dose of bactrim around 6 pm this evening.  Patient was prescribed Bactrim after being seen here on 04/24/2021 for a breast abscess.  Patient denies chest pain, dyspnea on exertion, hemoptysis, shortness of breath, wheezing, difficulty swallowing, facial swelling or hives. Care prior to arrival consisted of nothing.          Past Medical History:  Diagnosis Date  . Acid reflux   . Anemia, during pregnancy   . Bipolar disorder ([place])   . Chiari I malformation ([place])   . Complication of anesthesia    "Oxygen level drops"  . Depression   . Diabetes mellitus without complication ([place])   . Fibroid   . Gallbladder problem   . Hypertension   . Kidney stones 2010  . Lactose intolerance   . Migraine   . Osteoarthritis   . Schizophrenia ([place])   . Sleep apnea   . Ulcer of abdomen wall [facility])     Patient Active Problem List   Diagnosis Date Noted  . Plantar fasciitis 12/05/2020  . Tinea pedis 12/05/2020  . Screen for STD (sexually transmitted disease) 12/05/2020  . Tachycardia 12/03/2020  . Abnormal laboratory test 10/05/2020  . Morbid obesity ([place]) 10/02/2020  . Blurry vision, bilateral 10/02/2020  . Carpal tunnel syndrome of left wrist 09/02/2019  . Depression 01/17/2019  . Trichimoniasis 09/09/2018  . Chronic migraine 07/30/2017  . Chiari malformation type I ([place]) 07/30/2017  . Thyromegaly 04/09/2016  . Oropharyngeal dysphagia 04/09/2016  . Multiple thyroid nodules 04/09/2016  . Gastroesophageal reflux disease 04/09/2016  . Diabetes  mellitus type 2 in obese ([place]) 10/02/2015  . Low HDL (under 40) 10/02/2015  . 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 Dysfunctional uterine bleeding 06/19/2014  . Depo-Provera contraceptive status 06/19/2014  . Hypertension, benign 06/08/2013  . Menorrhagia 06/08/2013    Past Surgical History:  Procedure Laterality Date  . BREATH TEK H PYLORI N/A 05/31/2015   Procedure: BREATH TEK H PYLORI;  Surgeon: Greer Pickerel, MD;  Location: [facility];  Service: General;  Laterality: N/A;  . CARPAL TUNNEL RELEASE  02/03/2012   Procedure: CARPAL TUNNEL RELEASE;  Surgeon: Meredith Pel, MD;  Location: [place];  Service: Orthopedics;  Laterality: Left;  left carpal tunnel release  . CARPAL TUNNEL RELEASE  01/11/2013   Procedure: CARPAL TUNNEL RELEASE;  Surgeon: Meredith Pel, MD;  Location: [place];  Service: Orthopedics;  Laterality: Right;  Hand Set, Small Retractors, Lead Hand  . CESAREAN SECTION  2006, 2011  . CHOLECYSTECTOMY  06/2011  . cystectomy    . GANGLION CYST EXCISION  02/03/2012   Procedure: REMOVAL GANGLION OF WRIST;  Surgeon: Meredith Pel, MD;  Location: [place];  Service: Orthopedics;  Laterality: Left;  left dorsal ganglion cyst excision  . LAPAROSCOPIC GASTRIC SLEEVE RESECTION N/A 10/02/2015   Procedure: LAPAROSCOPIC GASTRIC SLEEVE RESECTION;  Surgeon: Greer Pickerel, MD;  [facility];  Service: General;  Laterality: N/A;  .  Lymp Glands Removed--under arm bil    . Lympectomy    . MULTIPLE TOOTH EXTRACTIONS  2003   and wisdom teeth  . Pilonidial Cyst Removed  1990's  . TUBAL LIGATION  2011  . UPPER GI ENDOSCOPY  10/02/2015   Procedure: UPPER GI ENDOSCOPY;  Surgeon: Greer Pickerel, MD;  [facility];  Service: General;;    OB History    Gravida  3   Para  2   Term  2   Preterm  0   AB  1   Living  2     SAB  1   IAB  0   Ectopic  0   Multiple  0   Live Births  2             Home Medications    Prior to Admission medications   Medication Sig Start Date End Date Taking? Authorizing Provider  clindamycin (CLEOCIN) 150 MG capsule Take 3 capsules (450 mg total) by mouth 3 (three) times daily for 7 days. 04/25/21 05/02/21 Yes Enrique Sack, FNP  EPINEPHrine 0.3 mg/0.3 mL IJ SOAJ injection Inject 0.3 mg into the muscle as needed for anaphylaxis (difficulty breathing, wheezing, tongue swelling, difficulty swallowing or facial swelling). 04/25/21  Yes Zeyna Mkrtchyan, Aldona Bar, FNP  predniSONE (STERAPRED UNI-PAK 21 TAB) 10 MG (21) TBPK tablet Take by mouth daily. Take 6 tabs by mouth daily  for 2 days, then 5 tabs for 2 days, then 4 tabs for 2 days, then 3 tabs for 2 days, 2 tabs for 2 days, then 1 tab by mouth daily for 2 days 04/25/21  Yes Enrique Sack, FNP  acetaminophen (TYLENOL) 500 MG tablet Take 500 mg by mouth every 6 (six) hours as needed.    [provider]  chlorhexidine (HIBICLENS) 4 % external liquid Apply topically daily as needed. 04/24/21   Volney American, PA-C  methocarbamol (ROBAXIN) 500 MG tablet Take 1 tablet (500 mg total) by mouth every 8 (eight) hours as needed for muscle spasms. 09/11/20   Hayden Rasmussen, MD  metoprolol succinate (TOPROL-XL) 25 MG 24 hr tablet Take 1 tablet (25 mg total) by mouth at bedtime. 12/11/20   Anderson, Chelsey L, DO  ondansetron (ZOFRAN) 4 MG tablet Take 1 tablet (4 mg total) by mouth every 8 (eight) hours as needed for nausea or vomiting. 10/18/20   Marcial Pacas, MD  pantoprazole (PROTONIX) 40 MG tablet TAKE 1 TABLET BY MOUTH EVERY DAY 10/21/19   Nelida Meuse III, MD  sucralfate (CARAFATE) 1 g tablet TAKE 1 TABLET BY MOUTH EVERY 6 HOURS AS NEEDED FOR ULCERS. Can crush and mix with water or applesauce if needed. 02/25/16   [provider]  SUMAtriptan (IMITREX) 100 MG tablet Take 1 tablet (100 mg total) by mouth once as needed for up to 1 dose for migraine. May repeat in 2 hours if headache persists  or recurs. 10/02/20   Marcial Pacas, MD  terbinafine (LAMISIL) 1 % cream Apply 1 application topically 2 (two) times daily. 12/03/20   Anderson, Chelsey L, DO  topiramate (TOPAMAX) 100 MG tablet Take 1 tablet (100 mg total) by mouth 2 (two) times daily. 10/02/20   Marcial Pacas, MD    Family History Family History  Problem Relation Age of Onset  . Kidney cancer Father   . Diabetes Mellitus II Father   . Hypertension Father   . Arthritis Mother   . Diabetes Mellitus II Mother   . Hypertension Mother   .  Diabetes Mother   . Anxiety disorder Mother   . Anesthesia problems Neg Hx     Social History Social History   Tobacco Use  . Smoking status: Former Smoker    Packs/day: 0.25    Types: Cigarettes    Quit date: 10/01/2009    Years since quitting: 11.5  . Smokeless tobacco: Never Used  Vaping Use  . Vaping Use: Never used  Substance Use Topics  . Alcohol use: Yes    Comment: has a drink twice weekly  . Drug use: No     Allergies   Bactrim [sulfamethoxazole-trimethoprim], Ibuprofen, Penicillins, and Oxycodone-acetaminophen   Review of Systems Review of Systems  Constitutional: Negative.   HENT: Negative for sore throat and trouble swallowing.   Respiratory: Negative for cough, shortness of breath and wheezing.   Cardiovascular: Negative for chest pain.  Gastrointestinal: Negative.   Skin: Negative for rash.  Neurological: Negative for speech difficulty.  All other systems reviewed and are negative.    Physical Exam Triage Vital Signs ED Triage Vitals  Enc Vitals Group     BP 04/25/21 1947 (!) 142/96     Pulse Rate 04/25/21 1947 (!) 101     Resp 04/25/21 1947 20     Temp 04/25/21 1947 98.3 F (36.8 C)     Temp Source 04/25/21 1947 Oral     SpO2 04/25/21 1947 100 %     Weight --      Height --      Head Circumference --      Peak Flow --      Pain Score 04/25/21 1956 8     Pain Loc --      Pain Edu? --      Excl. in [place]? --    No data found.  Updated Vital  Signs BP (!) 142/96 (BP Location: Left Arm)   Pulse (!) 101   Temp 98.3 F (36.8 C) (Oral)   Resp 20   LMP 04/11/2021   SpO2 100%   Visual Acuity Right Eye Distance:   Left Eye Distance:   Bilateral Distance:    Right Eye Near:   Left Eye Near:    Bilateral Near:     Physical Exam Vitals reviewed.  Constitutional:      General: She is not in acute distress.    Appearance: Normal appearance. She is not ill-appearing, toxic-appearing or diaphoretic.  HENT:     Head: Normocephalic.     Mouth/Throat:     Lips: Pink.     Mouth: Mucous membranes are moist.     Pharynx: Oropharynx is clear. Uvula midline. No pharyngeal swelling, posterior oropharyngeal erythema or uvula swelling.  Cardiovascular:     Rate and Rhythm: Normal rate and regular rhythm.     Pulses: Normal pulses.  Pulmonary:     Effort: Pulmonary effort is normal.     Breath sounds: Normal breath sounds.  Musculoskeletal:        General: Normal range of motion.     Cervical back: Normal range of motion and neck supple.  Skin:    General: Skin is warm and dry.     Findings: No rash.  Neurological:     General: No focal deficit present.     Mental Status: She is alert and oriented to person, place, and time.  Psychiatric:        Mood and Affect: Mood normal.        Behavior: Behavior normal.  UC Treatments / Results  Labs (all labs ordered are listed, but only abnormal results are displayed) Labs Reviewed - No data to display  EKG   Radiology No results found.  Procedures Procedures (including critical care time)  Medications Ordered in UC Medications  dexamethasone (DECADRON) injection 10 mg (10 mg Intramuscular Given 04/25/21 2019)    Initial Impression / Assessment and Plan / UC Course  I have reviewed the triage vital signs and the nursing notes.  Pertinent labs & imaging results that were available during my care of the patient were reviewed by me and considered in my medical  decision making (see chart for details).    40 year old female presenting with generalized itching and itchy throat after taking her first dose of Bactrim for a breast abscess which was diagnosed on 04/24/2021.  Patient has symptoms of anaphylaxis such as chest pain, shortness of breath, hemoptysis, wheezing, difficulty swallowing, facial swelling or hives.  Patient alert and oriented x3.  Afebrile.  Nontoxic.  No acute distress noted.  No hives noted.  Patient given Decadron injection in the clinic.  Prescribed steroid taper and EpiPen.  She was advised to take Benadryl around-the-clock for the next day or so and then as needed.  Bactrim has been discontinued and she has been prescribed clindamycin for her infection.  Patient advised to follow-up on Sunday, 04/28/2021 for reevaluation of her abscess.  Today's evaluation has revealed no signs of a dangerous process. Discussed diagnosis with patient and/or guardian. Patient and/or guardian aware of their diagnosis, possible red flag symptoms to watch out for and need for close follow up. Patient and/or guardian understands verbal and written discharge instructions. Patient and/or guardian comfortable with plan and disposition.  Patient and/or guardian has a clear mental status at this time, good insight into illness (after discussion and teaching) and has clear judgment to make decisions regarding their care  This care was provided during an unprecedented National Emergency due to the Novel Coronavirus (COVID-19) pandemic. COVID-19 infections and transmission risks place heavy strains on healthcare resources.  As this pandemic evolves, our facility, providers, and staff strive to respond fluidly, to remain operational, and to provide care relative to available resources and information. Outcomes are unpredictable and treatments are without well-defined guidelines. Further, the impact of COVID-19 on all aspects of urgent care, including the impact to patients  seeking care for reasons other than COVID-19, is unavoidable during this national emergency. At this time of the global pandemic, management of patients has significantly changed, even for non-COVID positive patients given high local and regional COVID volumes at this time requiring high healthcare system and resource utilization. The standard of care for management of both COVID suspected and non-COVID suspected patients continues to change rapidly at the local, regional, national, and global levels. This patient was worked up and treated to the best available but ever changing evidence and resources available at this current time.   Documentation was completed with the aid of voice recognition software. Transcription may contain typographical errors. Final Clinical Impressions(s) / UC Diagnoses   Final diagnoses:  Allergic reaction to sulfonamide  Drug allergy  Breast abscess     Discharge Instructions     I have listed sulfa as an allergy for you.  Stop taking this medication.  You have been given an injection of steroids here in the clinic.  You should start the oral steroids tomorrow.  Also take Benadryl every 4 hours for the next day or so and then as  needed.  You have been given an EpiPen to use as needed for anaphylaxis which includes difficulty breathing, wheezing, tongue swelling, difficulty swallowing or facial swelling.  If you ever have to use her EpiPen then you should immediately call 911 or have someone take you to the emergency department immediately.  I have provided you a new antibiotic for your infection.  Return here on Sunday for reevaluation of your abscess.      ED Prescriptions    Medication Sig Dispense Auth. Provider   clindamycin (CLEOCIN) 150 MG capsule Take 3 capsules (450 mg total) by mouth 3 (three) times daily for 7 days. 63 capsule Enrique Sack, FNP   EPINEPHrine 0.3 mg/0.3 mL IJ SOAJ injection Inject 0.3 mg into the muscle as needed for anaphylaxis  (difficulty breathing, wheezing, tongue swelling, difficulty swallowing or facial swelling). 1 each Enrique Sack, FNP   predniSONE (STERAPRED UNI-PAK 21 TAB) 10 MG (21) TBPK tablet Take by mouth daily. Take 6 tabs by mouth daily  for 2 days, then 5 tabs for 2 days, then 4 tabs for 2 days, then 3 tabs for 2 days, 2 tabs for 2 days, then 1 tab by mouth daily for 2 days 42 tablet Enrique Sack, FNP     PDMP not reviewed this encounter.   Enrique Sack, [place] 04/25/21 2024

## 2021-04-25 NOTE — Discharge Instructions (Signed)
I have listed sulfa as an allergy for you.  Stop taking this medication.  You have been given an injection of steroids here in the clinic.  You should start the oral steroids tomorrow.  Also take Benadryl every 4 hours for the next day or so and then as needed.  You have been given an EpiPen to use as needed for anaphylaxis which includes difficulty breathing, wheezing, tongue swelling, difficulty swallowing or facial swelling.  If you ever have to use her EpiPen then you should immediately call 911 or have someone take you to the emergency department immediately.  I have provided you a new antibiotic for your infection.  Return here on Sunday for reevaluation of your abscess.

## 2021-04-30 ENCOUNTER — Other Ambulatory Visit: Payer: Self-pay

## 2021-04-30 ENCOUNTER — Ambulatory Visit
Admission: RE | Admit: 2021-04-30 | Discharge: 2021-04-30 | Disposition: A | Discharge status: Home / Self Care | Payer: Medicaid Other | Source: Ambulatory Visit | Attending: Emergency Medicine | Admitting: Emergency Medicine

## 2021-04-30 VITALS — BP 138/84 | HR 105 | Temp 98.5°F | Resp 20

## 2021-04-30 DIAGNOSIS — N611 Abscess of the breast and nipple: Secondary | ICD-10-CM

## 2021-04-30 NOTE — Discharge Instructions (Signed)
Complete course of antibiotics Return in 48 hours for packing removal/changing recheck of abscess Warm compresses Follow-up for any concerns

## 2021-04-30 NOTE — ED Triage Notes (Signed)
Pt reports she is here for follow up for the abscess in the under right breast x 1 week. Pt started the clindamycin 4 days ago. Denies fever, chills, drainage.

## 2021-05-01 NOTE — ED Provider Notes (Signed)
[facility]    CSN: [id number] Arrival date & time: 04/30/21  1852      History   Chief Complaint Chief Complaint  Patient presents with  . Appointment    1800  . Abscess    HPI ADAISHA CAMPISE is a 40 y.o. female presenting today for follow-up of breast abscess.  Was seen here approximately 1 week ago for right breast abscess and started on Bactrim, had possible allergic reaction and switch to clindamycin.  Reports continued pain swelling and discomfort to this area without any spontaneous drainage.  Denies fevers.  HPI  Past Medical History:  Diagnosis Date  . Acid reflux   . Anemia, during pregnancy   . Bipolar disorder ([place])   . Chiari I malformation ([place])   . Complication of anesthesia    "Oxygen level drops"  . Depression   . Diabetes mellitus without complication ([place])   . Fibroid   . Gallbladder problem   . Hypertension   . Kidney stones 2010  . Lactose intolerance   . Migraine   . Osteoarthritis   . Schizophrenia ([place])   . Sleep apnea   . Ulcer of abdomen wall [facility])     Patient Active Problem List   Diagnosis Date Noted  . Plantar fasciitis 12/05/2020  . Tinea pedis 12/05/2020  . Screen for STD (sexually transmitted disease) 12/05/2020  . Tachycardia 12/03/2020  . Abnormal laboratory test 10/05/2020  . Morbid obesity ([place]) 10/02/2020  . Blurry vision, bilateral 10/02/2020  . Carpal tunnel syndrome of left wrist 09/02/2019  . Depression 01/17/2019  . Trichimoniasis 09/09/2018  . Chronic migraine 07/30/2017  . Chiari malformation type I ([place]) 07/30/2017  . Thyromegaly 04/09/2016  . Oropharyngeal dysphagia 04/09/2016  . Multiple thyroid nodules 04/09/2016  . Gastroesophageal reflux disease 04/09/2016  . Diabetes mellitus type 2 in obese ([place]) 10/02/2015  . Low HDL (under 40) 10/02/2015  . 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 Dysfunctional uterine  bleeding 06/19/2014  . Depo-Provera contraceptive status 06/19/2014  . Hypertension, benign 06/08/2013  . Menorrhagia 06/08/2013    Past Surgical History:  Procedure Laterality Date  . BREATH TEK H PYLORI N/A 05/31/2015   Procedure: BREATH TEK H PYLORI;  Surgeon: Greer Pickerel, MD;  Location: [facility];  Service: General;  Laterality: N/A;  . CARPAL TUNNEL RELEASE  02/03/2012   Procedure: CARPAL TUNNEL RELEASE;  Surgeon: Meredith Pel, MD;  Location: [place];  Service: Orthopedics;  Laterality: Left;  left carpal tunnel release  . CARPAL TUNNEL RELEASE  01/11/2013   Procedure: CARPAL TUNNEL RELEASE;  Surgeon: Meredith Pel, MD;  Location: [place];  Service: Orthopedics;  Laterality: Right;  Hand Set, Small Retractors, Lead Hand  . CESAREAN SECTION  2006, 2011  . CHOLECYSTECTOMY  06/2011  . cystectomy    . GANGLION CYST EXCISION  02/03/2012   Procedure: REMOVAL GANGLION OF WRIST;  Surgeon: Meredith Pel, MD;  Location: [place];  Service: Orthopedics;  Laterality: Left;  left dorsal ganglion cyst excision  . LAPAROSCOPIC GASTRIC SLEEVE RESECTION N/A 10/02/2015   Procedure: LAPAROSCOPIC GASTRIC SLEEVE RESECTION;  Surgeon: Greer Pickerel, MD;  Location: [facility];  Service: General;  Laterality: N/A;  . Lymp Glands Removed--under arm bil    . Lympectomy    . MULTIPLE TOOTH EXTRACTIONS  2003   and wisdom teeth  . Pilonidial Cyst Removed  1990's  . TUBAL LIGATION  2011  .  UPPER GI ENDOSCOPY  10/02/2015   Procedure: UPPER GI ENDOSCOPY;  Surgeon: Greer Pickerel, MD;  Location: [facility];  Service: General;;    OB History    Gravida  3   Para  2   Term  2   Preterm  0   AB  1   Living  2     SAB  1   IAB  0   Ectopic  0   Multiple  0   Live Births  2            Home Medications    Prior to Admission medications   Medication Sig Start Date End Date Taking? Authorizing Provider  acetaminophen (TYLENOL) 500 MG tablet Take 500 mg by mouth every 6 (six) hours as needed.     [provider]  chlorhexidine (HIBICLENS) 4 % external liquid Apply topically daily as needed. 04/24/21   Volney American, PA-C  clindamycin (CLEOCIN) 150 MG capsule Take 3 capsules (450 mg total) by mouth 3 (three) times daily for 7 days. 04/25/21 05/02/21  Enrique Sack, FNP  EPINEPHrine 0.3 mg/0.3 mL IJ SOAJ injection Inject 0.3 mg into the muscle as needed for anaphylaxis (difficulty breathing, wheezing, tongue swelling, difficulty swallowing or facial swelling). 04/25/21   Enrique Sack, FNP  methocarbamol (ROBAXIN) 500 MG tablet Take 1 tablet (500 mg total) by mouth every 8 (eight) hours as needed for muscle spasms. 09/11/20   Hayden Rasmussen, MD  metoprolol succinate (TOPROL-XL) 25 MG 24 hr tablet Take 1 tablet (25 mg total) by mouth at bedtime. 12/11/20   Anderson, Chelsey L, DO  ondansetron (ZOFRAN) 4 MG tablet Take 1 tablet (4 mg total) by mouth every 8 (eight) hours as needed for nausea or vomiting. 10/18/20   Marcial Pacas, MD  pantoprazole (PROTONIX) 40 MG tablet TAKE 1 TABLET BY MOUTH EVERY DAY 10/21/19   Doran Stabler, MD  predniSONE (STERAPRED UNI-PAK 21 TAB) 10 MG (21) TBPK tablet Take by mouth daily. Take 6 tabs by mouth daily  for 2 days, then 5 tabs for 2 days, then 4 tabs for 2 days, then 3 tabs for 2 days, 2 tabs for 2 days, then 1 tab by mouth daily for 2 days 04/25/21   Enrique Sack, FNP  sucralfate (CARAFATE) 1 g tablet TAKE 1 TABLET BY MOUTH EVERY 6 HOURS AS NEEDED FOR ULCERS. Can crush and mix with water or applesauce if needed. 02/25/16   [provider]  SUMAtriptan (IMITREX) 100 MG tablet Take 1 tablet (100 mg total) by mouth once as needed for up to 1 dose for migraine. May repeat in 2 hours if headache persists or recurs. 10/02/20   Marcial Pacas, MD  terbinafine (LAMISIL) 1 % cream Apply 1 application topically 2 (two) times daily. 12/03/20   Anderson, Chelsey L, DO  topiramate (TOPAMAX) 100 MG tablet Take 1 tablet (100 mg total) by mouth 2  (two) times daily. 10/02/20   Marcial Pacas, MD    Family History Family History  Problem Relation Age of Onset  . Kidney cancer Father   . Diabetes Mellitus II Father   . Hypertension Father   . Arthritis Mother   . Diabetes Mellitus II Mother   . Hypertension Mother   . Diabetes Mother   . Anxiety disorder Mother   . Anesthesia problems Neg Hx     Social History Social History   Tobacco Use  . Smoking status: Former Smoker    Packs/day: 0.25  Types: Cigarettes    Quit date: 10/01/2009    Years since quitting: 11.5  . Smokeless tobacco: Never Used  Vaping Use  . Vaping Use: Never used  Substance Use Topics  . Alcohol use: Yes    Comment: has a drink twice weekly  . Drug use: No     Allergies   Bactrim [sulfamethoxazole-trimethoprim], Ibuprofen, Penicillins, and Oxycodone-acetaminophen   Review of Systems Review of Systems  Constitutional: Negative for fatigue and fever.  HENT: Negative for mouth sores.   Eyes: Negative for visual disturbance.  Respiratory: Negative for shortness of breath.   Cardiovascular: Negative for chest pain.  Gastrointestinal: Negative for abdominal pain, nausea and vomiting.  Genitourinary: Negative for genital sores.  Musculoskeletal: Negative for arthralgias and joint swelling.  Skin: Positive for color change. Negative for rash and wound.  Neurological: Negative for dizziness, weakness, light-headedness and headaches.     Physical Exam Triage Vital Signs ED Triage Vitals  Enc Vitals Group     BP 04/30/21 1954 138/84     Pulse Rate 04/30/21 1954 (!) 105     Resp 04/30/21 1954 20     Temp 04/30/21 1954 98.5 F (36.9 C)     Temp Source 04/30/21 1954 Oral     SpO2 04/30/21 1954 98 %     Weight --      Height --      Head Circumference --      Peak Flow --      Pain Score 04/30/21 1953 8     Pain Loc --      Pain Edu? --      Excl. in [place]? --    No data found.  Updated Vital Signs BP 138/84 (BP Location: Left Arm)    Pulse (!) 105   Temp 98.5 F (36.9 C) (Oral)   Resp 20   LMP 04/11/2021   SpO2 98%   Visual Acuity Right Eye Distance:   Left Eye Distance:   Bilateral Distance:    Right Eye Near:   Left Eye Near:    Bilateral Near:     Physical Exam Vitals and nursing note reviewed.  Constitutional:      Appearance: She is well-developed.     Comments: No acute distress  HENT:     Head: Normocephalic and atraumatic.     Nose: Nose normal.  Eyes:     Conjunctiva/sclera: Conjunctivae normal.  Cardiovascular:     Rate and Rhythm: Normal rate.  Pulmonary:     Effort: Pulmonary effort is normal. No respiratory distress.  Abdominal:     General: There is no distension.  Musculoskeletal:        General: Normal range of motion.     Cervical back: Neck supple.  Skin:    General: Skin is warm and dry.     Comments: Right breast/central lower chest with area of erythema and induration  Neurological:     Mental Status: She is alert and oriented to person, place, and time.      UC Treatments / Results  Labs (all labs ordered are listed, but only abnormal results are displayed) Labs Reviewed - No data to display  EKG   Radiology No results found.  Procedures Incision and Drainage  Date/Time: 04/30/2021 8:43 PM Performed by: Myking Sar, [place] C, PA-C Authorized by: Wajiha Versteeg, Elesa Hacker, PA-C   Consent:    Consent obtained:  Verbal   Consent given by:  Patient   Risks, benefits, and alternatives were  discussed: not applicable     Risks discussed:  Bleeding, pain, incomplete drainage and infection   Alternatives discussed:  No treatment Universal protocol:    Patient identity confirmed:  Verbally with patient Location:    Type:  Abscess   Location:  Trunk   Trunk location:  R breast Pre-procedure details:    Skin preparation:  Povidone-iodine Sedation:    Sedation type:  None Anesthesia:    Anesthesia method:  Local infiltration   Local anesthetic:  Lidocaine 1% w/o  epi Procedure type:    Complexity:  Simple Procedure details:    Incision types:  Single straight   Incision depth:  Subcutaneous   Drainage:  Purulent and bloody   Drainage amount:  Copious   Packing materials:  1/4 in iodoform gauze Post-procedure details:    Procedure completion:  Tolerated well, no immediate complications   (including critical care time)  Medications Ordered in UC Medications - No data to display  Initial Impression / Assessment and Plan / UC Course  I have reviewed the triage vital signs and the nursing notes.  Pertinent labs & imaging results that were available during my care of the patient were reviewed by me and considered in my medical decision making (see chart for details).     I&D of breast abscess with significant amount of drainage, packing placed, patient to return in 2 days for packing removal, past possible repack and follow-up of abscess.  Finished course of antibiotics.  Discussed strict return precautions. Patient verbalized understanding and is agreeable with plan.  Final Clinical Impressions(s) / UC Diagnoses   Final diagnoses:  Breast abscess     Discharge Instructions     Complete course of antibiotics Return in 48 hours for packing removal/changing recheck of abscess Warm compresses Follow-up for any concerns   ED Prescriptions    None     PDMP not reviewed this encounter.   Kaushal Vannice, [place] C, PA-C 05/01/21 1045

## 2021-05-03 ENCOUNTER — Ambulatory Visit
Admission: EM | Admit: 2021-05-03 | Discharge: 2021-05-03 | Disposition: A | Discharge status: Home / Self Care | Payer: Medicaid Other | Attending: Internal Medicine | Admitting: Internal Medicine

## 2021-05-03 ENCOUNTER — Ambulatory Visit: Payer: Self-pay

## 2021-05-03 ENCOUNTER — Other Ambulatory Visit: Payer: Self-pay

## 2021-05-03 ENCOUNTER — Ambulatory Visit
Admission: EM | Admit: 2021-05-03 | Discharge: 2021-05-03 | Disposition: A | Discharge status: Home / Self Care | Payer: Medicaid Other

## 2021-05-03 VITALS — BP 159/82 | HR 102 | Temp 98.8°F | Resp 18

## 2021-05-03 DIAGNOSIS — Z5189 Encounter for other specified aftercare: Secondary | ICD-10-CM | POA: Diagnosis not present

## 2021-05-03 NOTE — ED Triage Notes (Signed)
Pt here for wound check after having abscess drained on 5/3

## 2021-05-03 NOTE — ED Provider Notes (Signed)
[facility]    CSN: [id number] Arrival date & time: 05/03/21  1630      History   Chief Complaint Chief Complaint  Patient presents with  . Wound Check  . Appointment    1700    HPI Melinda Ruiz is a 40 y.o. female comes to the urgent care for wound check.  Patient had incision and drainage done on 5 3.  Abscess was on the right inframammary area.  Drainage is decreasing.  Packing is still in place.  Induration seems to have decreased and there is no  erythema.  Patient is here for wound check.  HPI  Past Medical History:  Diagnosis Date  . Acid reflux   . Anemia, during pregnancy   . Bipolar disorder ([place])   . Chiari I malformation ([place])   . Complication of anesthesia    "Oxygen level drops"  . Depression   . Diabetes mellitus without complication ([place])   . Fibroid   . Gallbladder problem   . Hypertension   . Kidney stones 2010  . Lactose intolerance   . Migraine   . Osteoarthritis   . Schizophrenia ([place])   . Sleep apnea   . Ulcer of abdomen wall [facility])     Patient Active Problem List   Diagnosis Date Noted  . Plantar fasciitis 12/05/2020  . Tinea pedis 12/05/2020  . Screen for STD (sexually transmitted disease) 12/05/2020  . Tachycardia 12/03/2020  . Abnormal laboratory test 10/05/2020  . Morbid obesity ([place]) 10/02/2020  . Blurry vision, bilateral 10/02/2020  . Carpal tunnel syndrome of left wrist 09/02/2019  . Depression 01/17/2019  . Trichimoniasis 09/09/2018  . Chronic migraine 07/30/2017  . Chiari malformation type I ([place]) 07/30/2017  . Thyromegaly 04/09/2016  . Oropharyngeal dysphagia 04/09/2016  . Multiple thyroid nodules 04/09/2016  . Gastroesophageal reflux disease 04/09/2016  . Diabetes mellitus type 2 in obese ([place]) 10/02/2015  . Low HDL (under 40) 10/02/2015  . 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 Dysfunctional uterine bleeding 06/19/2014   . Depo-Provera contraceptive status 06/19/2014  . Hypertension, benign 06/08/2013  . Menorrhagia 06/08/2013    Past Surgical History:  Procedure Laterality Date  . BREATH TEK H PYLORI N/A 05/31/2015   Procedure: BREATH TEK H PYLORI;  Surgeon: Greer Pickerel, MD;  Location: [facility];  Service: General;  Laterality: N/A;  . CARPAL TUNNEL RELEASE  02/03/2012   Procedure: CARPAL TUNNEL RELEASE;  Surgeon: Meredith Pel, MD;  Location: [place];  Service: Orthopedics;  Laterality: Left;  left carpal tunnel release  . CARPAL TUNNEL RELEASE  01/11/2013   Procedure: CARPAL TUNNEL RELEASE;  Surgeon: Meredith Pel, MD;  Location: [place];  Service: Orthopedics;  Laterality: Right;  Hand Set, Small Retractors, Lead Hand  . CESAREAN SECTION  2006, 2011  . CHOLECYSTECTOMY  06/2011  . cystectomy    . GANGLION CYST EXCISION  02/03/2012   Procedure: REMOVAL GANGLION OF WRIST;  Surgeon: Meredith Pel, MD;  Location: [place];  Service: Orthopedics;  Laterality: Left;  left dorsal ganglion cyst excision  . LAPAROSCOPIC GASTRIC SLEEVE RESECTION N/A 10/02/2015   Procedure: LAPAROSCOPIC GASTRIC SLEEVE RESECTION;  Surgeon: Greer Pickerel, MD;  [facility];  Service: General;  Laterality: N/A;  . Lymp Glands Removed--under arm bil    . Lympectomy    . MULTIPLE TOOTH EXTRACTIONS  2003   and wisdom teeth  . Pilonidial Cyst Removed  1990's  . TUBAL LIGATION  2011  . UPPER GI ENDOSCOPY  10/02/2015   Procedure: UPPER GI ENDOSCOPY;  Surgeon: Greer Pickerel, MD;  [facility];  Service: General;;    OB History    Gravida  3   Para  2   Term  2   Preterm  0   AB  1   Living  2     SAB  1   IAB  0   Ectopic  0   Multiple  0   Live Births  2            Home Medications    Prior to Admission medications   Medication Sig Start Date End Date Taking? Authorizing Provider  acetaminophen (TYLENOL) 500 MG tablet Take 500 mg by mouth every 6 (six) hours as needed.    [provider]   chlorhexidine (HIBICLENS) 4 % external liquid Apply topically daily as needed. 04/24/21   Volney American, PA-C  EPINEPHrine 0.3 mg/0.3 mL IJ SOAJ injection Inject 0.3 mg into the muscle as needed for anaphylaxis (difficulty breathing, wheezing, tongue swelling, difficulty swallowing or facial swelling). 04/25/21   Enrique Sack, FNP  methocarbamol (ROBAXIN) 500 MG tablet Take 1 tablet (500 mg total) by mouth every 8 (eight) hours as needed for muscle spasms. 09/11/20   Hayden Rasmussen, MD  metoprolol succinate (TOPROL-XL) 25 MG 24 hr tablet Take 1 tablet (25 mg total) by mouth at bedtime. 12/11/20   Anderson, Chelsey L, DO  ondansetron (ZOFRAN) 4 MG tablet Take 1 tablet (4 mg total) by mouth every 8 (eight) hours as needed for nausea or vomiting. 10/18/20   Marcial Pacas, MD  pantoprazole (PROTONIX) 40 MG tablet TAKE 1 TABLET BY MOUTH EVERY DAY 10/21/19   Doran Stabler, MD  predniSONE (STERAPRED UNI-PAK 21 TAB) 10 MG (21) TBPK tablet Take by mouth daily. Take 6 tabs by mouth daily  for 2 days, then 5 tabs for 2 days, then 4 tabs for 2 days, then 3 tabs for 2 days, 2 tabs for 2 days, then 1 tab by mouth daily for 2 days Patient not taking: Reported on 05/03/2021 04/25/21   Enrique Sack, FNP  sucralfate (CARAFATE) 1 g tablet TAKE 1 TABLET BY MOUTH EVERY 6 HOURS AS NEEDED FOR ULCERS. Can crush and mix with water or applesauce if needed. 02/25/16   [provider]  SUMAtriptan (IMITREX) 100 MG tablet Take 1 tablet (100 mg total) by mouth once as needed for up to 1 dose for migraine. May repeat in 2 hours if headache persists or recurs. 10/02/20   Marcial Pacas, MD  terbinafine (LAMISIL) 1 % cream Apply 1 application topically 2 (two) times daily. 12/03/20   Anderson, Chelsey L, DO  topiramate (TOPAMAX) 100 MG tablet Take 1 tablet (100 mg total) by mouth 2 (two) times daily. 10/02/20   Marcial Pacas, MD    Family History Family History  Problem Relation Age of Onset  . Kidney cancer  Father   . Diabetes Mellitus II Father   . Hypertension Father   . Arthritis Mother   . Diabetes Mellitus II Mother   . Hypertension Mother   . Diabetes Mother   . Anxiety disorder Mother   . Anesthesia problems Neg Hx     Social History Social History   Tobacco Use  . Smoking status: Former Smoker    Packs/day: 0.25    Types: Cigarettes    Quit date: 10/01/2009  Years since quitting: 11.5  . Smokeless tobacco: Never Used  Vaping Use  . Vaping Use: Never used  Substance Use Topics  . Alcohol use: Yes    Comment: has a drink twice weekly  . Drug use: No     Allergies   Bactrim [sulfamethoxazole-trimethoprim], Ibuprofen, Penicillins, and Oxycodone-acetaminophen   Review of Systems Review of Systems   Physical Exam Triage Vital Signs ED Triage Vitals  Enc Vitals Group     BP 05/03/21 1658 (!) 159/82     Pulse Rate 05/03/21 1658 (!) 102     Resp 05/03/21 1658 18     Temp 05/03/21 1658 98.8 F (37.1 C)     Temp Source 05/03/21 1658 Oral     SpO2 05/03/21 1658 97 %     Weight --      Height --      Head Circumference --      Peak Flow --      Pain Score 05/03/21 1659 6     Pain Loc --      Pain Edu? --      Excl. in [place]? --    No data found.  Updated Vital Signs BP (!) 159/82 (BP Location: Left Arm)   Pulse (!) 102   Temp 98.8 F (37.1 C) (Oral)   Resp 18   LMP 04/11/2021   SpO2 97%   Visual Acuity Right Eye Distance:   Left Eye Distance:   Bilateral Distance:    Right Eye Near:   Left Eye Near:    Bilateral Near:     Physical Exam Skin:    General: Skin is warm.     Findings: Lesion present.     Comments: Wound on the right inframammary area.  Drainage is minimal.  2 inch of packing was taken out.  Tenderness is improved.      UC Treatments / Results  Labs (all labs ordered are listed, but only abnormal results are displayed) Labs Reviewed - No data to display  EKG   Radiology No results found.  Procedures Procedures  (including critical care time)  Medications Ordered in UC Medications - No data to display  Initial Impression / Assessment and Plan / UC Course  I have reviewed the triage vital signs and the nursing notes.  Pertinent labs & imaging results that were available during my care of the patient were reviewed by me and considered in my medical decision making (see chart for details).     1.  Wound check: Packing has been removed Continue dressing changes on a daily basis Okay to wash the area with soap and water. Return precautions given. Final Clinical Impressions(s) / UC Diagnoses   Final diagnoses:  Wound check, abscess     Discharge Instructions     Daily wound dressing changes If you notice worsening pain, swelling, purulence please return to urgent care to be reevaluated.   ED Prescriptions    None     PDMP not reviewed this encounter.   Chase Picket, MD 05/03/21 [phone]

## 2021-05-03 NOTE — Discharge Instructions (Signed)
Daily wound dressing changes If you notice worsening pain, swelling, purulence please return to urgent care to be reevaluated.

## 2021-05-27 ENCOUNTER — Ambulatory Visit: Payer: Self-pay

## 2021-05-28 ENCOUNTER — Ambulatory Visit: Payer: Self-pay

## 2021-05-29 ENCOUNTER — Ambulatory Visit: Payer: Self-pay

## 2021-06-07 ENCOUNTER — Other Ambulatory Visit: Payer: Self-pay | Admitting: Student in an Organized Health Care Education/Training Program

## 2021-07-26 ENCOUNTER — Other Ambulatory Visit: Payer: Self-pay

## 2021-07-26 ENCOUNTER — Emergency Department (HOSPITAL_COMMUNITY)
Admission: EM | Admit: 2021-07-26 | Discharge: 2021-07-26 | Disposition: A | Discharge status: Home / Self Care | Payer: Medicaid Other | Attending: Emergency Medicine | Admitting: Emergency Medicine

## 2021-07-26 ENCOUNTER — Encounter (HOSPITAL_COMMUNITY): Payer: Self-pay | Admitting: Oncology

## 2021-07-26 DIAGNOSIS — Z79899 Other long term (current) drug therapy: Secondary | ICD-10-CM | POA: Insufficient documentation

## 2021-07-26 DIAGNOSIS — E119 Type 2 diabetes mellitus without complications: Secondary | ICD-10-CM | POA: Insufficient documentation

## 2021-07-26 DIAGNOSIS — R519 Headache, unspecified: Secondary | ICD-10-CM | POA: Diagnosis present

## 2021-07-26 DIAGNOSIS — I10 Essential (primary) hypertension: Secondary | ICD-10-CM | POA: Insufficient documentation

## 2021-07-26 DIAGNOSIS — U071 COVID-19: Secondary | ICD-10-CM | POA: Diagnosis not present

## 2021-07-26 DIAGNOSIS — Z87891 Personal history of nicotine dependence: Secondary | ICD-10-CM | POA: Insufficient documentation

## 2021-07-26 DIAGNOSIS — R630 Anorexia: Secondary | ICD-10-CM | POA: Insufficient documentation

## 2021-07-26 LAB — BASIC METABOLIC PANEL
Anion gap: 9 (ref 5–15)
BUN: 9 mg/dL (ref 6–20)
CO2: 23 mmol/L (ref 22–32)
Calcium: 9.1 mg/dL (ref 8.9–10.3)
Chloride: 105 mmol/L (ref 98–111)
Creatinine, Ser: 0.66 mg/dL (ref 0.44–1.00)
GFR, Estimated: >60 mL/min (ref >60)
Glucose, Bld: 71 mg/dL (ref 70–99)
Potassium: 4.7 mmol/L (ref 3.5–5.1)
Sodium: 137 mmol/L (ref 135–145)

## 2021-07-26 MED ORDER — DIPHENHYDRAMINE HCL 25 MG PO TABS: 25.0000 mg | ORAL_TABLET | Freq: Every evening | ORAL | 0 refills | Status: DC | PRN

## 2021-07-26 MED ORDER — NIRMATRELVIR/RITONAVIR (PAXLOVID)TABLET: 3.0000 | ORAL_TABLET | Freq: Two times a day (BID) | ORAL | 0 refills | Status: AC

## 2021-07-26 MED ORDER — DIPHENHYDRAMINE HCL 50 MG/ML IJ SOLN
25.0000 mg | Freq: Once | INTRAMUSCULAR | Status: AC
  Administered 2021-07-26: 25 mg via INTRAVENOUS
  Filled 2021-07-26: qty 1

## 2021-07-26 MED ORDER — METOCLOPRAMIDE HCL 10 MG PO TABS
10.0000 mg | ORAL_TABLET | Freq: Two times a day (BID) | ORAL | 0 refills | Status: DC | PRN
Start: 2021-07-26 — End: 2021-11-26

## 2021-07-26 MED ORDER — METOCLOPRAMIDE HCL 5 MG/ML IJ SOLN
10.0000 mg | Freq: Once | INTRAMUSCULAR | Status: AC
  Administered 2021-07-26: 10 mg via INTRAVENOUS
  Filled 2021-07-26: qty 2

## 2021-07-26 NOTE — ED Triage Notes (Signed)
Pt bib [facility] d/t COVID sx.  Pt dx w/ COVID 2 days ago.  Pt reports cough, shob, weakness.  States she has used OTC medications w/o relief.  Last use of OTC, yesterday.

## 2021-07-26 NOTE — ED Provider Notes (Signed)
[facility] DEPT Provider Note   CSN: [id number] Arrival date & time: 07/26/21  1040     History CC: Covid symptoms   Melinda Ruiz is a 40 y.o. female with history of obesity, former diabetic, presenting to the ED with COVID symptoms.  She reports her test positive for COVID 2 days ago.  She began having symptoms 3 days ago.  She describes a throbbing headache.  She describes myalgias, poor appetite, change in taste.  She reports she has had the COVID vaccines.  She says she was formally diabetic, but has been taken off of diabetic medications after weight loss surgery.  She also reports a history of hypertension but does not currently take any medications.  She has several drug allergies which I confirmed based on her epic chart today.  HPI     Past Medical History:  Diagnosis Date   Acid reflux    Anemia, during pregnancy    Bipolar disorder ([place])    Chiari I malformation ([place])    Complication of anesthesia    "Oxygen level drops"   Depression    Diabetes mellitus without complication ([place])    Fibroid    Gallbladder problem    Hypertension    Kidney stones 2010   Lactose intolerance    Migraine    Osteoarthritis    Schizophrenia ([place])    Sleep apnea    Ulcer of abdomen wall [facility])     Patient Active Problem List   Diagnosis Date Noted   Plantar fasciitis 12/05/2020   Tinea pedis 12/05/2020   Screen for STD (sexually transmitted disease) 12/05/2020   Tachycardia 12/03/2020   Abnormal laboratory test 10/05/2020   Morbid obesity ([place]) 10/02/2020   Blurry vision, bilateral 10/02/2020   Carpal tunnel syndrome of left wrist 09/02/2019   Depression 01/17/2019   Trichimoniasis 09/09/2018   Chronic migraine 07/30/2017   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, considering revision bypass due to severe reflux 10/02/2015   Dysfunctional uterine bleeding 06/19/2014   Depo-Provera contraceptive status 06/19/2014   Hypertension, benign 06/08/2013   Menorrhagia 06/08/2013    Past Surgical History:  Procedure Laterality Date   BREATH TEK H PYLORI N/A 05/31/2015   Procedure: BREATH TEK Kandis Ban;  Surgeon: Greer Pickerel, MD;  [facility];  Service: General;  Laterality: N/A;   CARPAL TUNNEL RELEASE  02/03/2012   Procedure: CARPAL TUNNEL RELEASE;  Surgeon: Meredith Pel, MD;  Location: [facility];  Service: Orthopedics;  Laterality: Left;  left carpal tunnel release   CARPAL TUNNEL RELEASE  01/11/2013   Procedure: CARPAL TUNNEL RELEASE;  Surgeon: Meredith Pel, MD;  Location: [facility];  Service: Orthopedics;  Laterality: Right;  Hand Set, Small Retractors, Lead Hand   CESAREAN SECTION  2006, 2011   CHOLECYSTECTOMY  06/2011   cystectomy     GANGLION CYST EXCISION  02/03/2012   Procedure: REMOVAL GANGLION OF WRIST;  Surgeon: Meredith Pel, MD;  Location: [facility];  Service: Orthopedics;  Laterality: Left;  left dorsal ganglion cyst excision   LAPAROSCOPIC GASTRIC SLEEVE RESECTION N/A 10/02/2015   Procedure: LAPAROSCOPIC GASTRIC SLEEVE RESECTION;  Surgeon: Greer Pickerel, MD;  Location[facility];  Service: General;  Laterality: N/A;   Lymp Glands Removed--under arm bil     Lympectomy     MULTIPLE TOOTH EXTRACTIONS  2003   and wisdom teeth   Pilonidial Cyst Removed  1990's   TUBAL LIGATION  2011   UPPER GI ENDOSCOPY  10/02/2015   Procedure: UPPER GI ENDOSCOPY;  Surgeon: Greer Pickerel, MD;  [facility];  Service: General;;     OB History     Gravida  3   Para  2   Term  2   Preterm  0   AB  1   Living  2      SAB  1   IAB  0   Ectopic  0   Multiple  0   Live Births  2           Family History  Problem  Relation Age of Onset   Kidney cancer Father    Diabetes Mellitus II Father    Hypertension Father    Arthritis Mother    Diabetes Mellitus II Mother    Hypertension Mother    Diabetes Mother    Anxiety disorder Mother    Anesthesia problems Neg Hx     Social History   Tobacco Use   Smoking status: Former    Packs/day: 0.25    Types: Cigarettes    Quit date: 10/01/2009    Years since quitting: 11.8   Smokeless tobacco: Never  Vaping Use   Vaping Use: Never used  Substance Use Topics   Alcohol use: Yes    Comment: has a drink twice weekly   Drug use: No    Home Medications Prior to Admission medications   Medication Sig Start Date End Date Taking? Authorizing Provider  diphenhydrAMINE (BENADRYL) 25 MG tablet Take 1 tablet (25 mg total) by mouth at bedtime as needed for up to 10 doses. 07/26/21  Yes Vint Pola, Carola Rhine, MD  metoCLOPramide (REGLAN) 10 MG tablet Take 1 tablet (10 mg total) by mouth 2 (two) times daily as needed for up to 6 doses for nausea. 07/26/21  Yes Nykole Matos, Carola Rhine, MD  nirmatrelvir/ritonavir EUA (PAXLOVID) TABS Take 3 tablets by mouth 2 (two) times daily for 5 days. Patient GFR is >60 (normal) and Cr 0.6. Take nirmatrelvir (150 mg) two tablets twice daily for 5 days and ritonavir (100 mg) one tablet twice daily for 5 days. 07/26/21 07/31/21 Yes Heiley Shaikh, Carola Rhine, MD  acetaminophen (TYLENOL) 500 MG tablet Take 500 mg by mouth every 6 (six) hours as needed.    [provider]  chlorhexidine (HIBICLENS) 4 % external liquid Apply topically daily as needed. 04/24/21   Volney American, PA-C  EPINEPHrine 0.3 mg/0.3 mL IJ SOAJ injection Inject 0.3 mg into the muscle as needed for anaphylaxis (difficulty breathing, wheezing, tongue swelling, difficulty swallowing or facial swelling). 04/25/21   Enrique Sack, FNP  methocarbamol (ROBAXIN) 500 MG tablet Take 1 tablet (500 mg total) by mouth every 8 (eight) hours as needed for muscle spasms. 09/11/20   Hayden Rasmussen, MD  metoprolol succinate (TOPROL-XL) 25 MG 24 hr tablet Take 1 tablet (25 mg total) by mouth at bedtime. 12/11/20   Anderson, Chelsey L, DO  ondansetron (ZOFRAN) 4 MG tablet Take 1 tablet (4 mg total) by mouth every 8 (eight) hours as needed for nausea or vomiting. 10/18/20   Marcial Pacas, MD  pantoprazole (PROTONIX) 40 MG tablet TAKE 1 TABLET BY MOUTH EVERY DAY 10/21/19   Nelida Meuse III,  MD  predniSONE (STERAPRED UNI-PAK 21 TAB) 10 MG (21) TBPK tablet Take by mouth daily. Take 6 tabs by mouth daily  for 2 days, then 5 tabs for 2 days, then 4 tabs for 2 days, then 3 tabs for 2 days, 2 tabs for 2 days, then 1 tab by mouth daily for 2 days Patient not taking: Reported on 05/03/2021 04/25/21   Enrique Sack, FNP  sucralfate (CARAFATE) 1 g tablet TAKE 1 TABLET BY MOUTH EVERY 6 HOURS AS NEEDED FOR ULCERS. Can crush and mix with water or applesauce if needed. 02/25/16   [provider]  SUMAtriptan (IMITREX) 100 MG tablet Take 1 tablet (100 mg total) by mouth once as needed for up to 1 dose for migraine. May repeat in 2 hours if headache persists or recurs. 10/02/20   Marcial Pacas, MD  terbinafine (LAMISIL) 1 % cream Apply 1 application topically 2 (two) times daily. 12/03/20   Anderson, Chelsey L, DO  topiramate (TOPAMAX) 100 MG tablet Take 1 tablet (100 mg total) by mouth 2 (two) times daily. 10/02/20   Marcial Pacas, MD    Allergies    Bactrim [sulfamethoxazole-trimethoprim], Ibuprofen, Penicillins, and Oxycodone-acetaminophen  Review of Systems   Review of Systems  Constitutional:  Positive for appetite change, chills, fatigue and fever.  Eyes:  Negative for pain and visual disturbance.  Respiratory:  Positive for cough. Negative for shortness of breath.   Cardiovascular:  Positive for chest pain. Negative for palpitations.  Gastrointestinal:  Positive for nausea. Negative for abdominal pain and vomiting.  Genitourinary:  Negative for dysuria and hematuria.  Musculoskeletal:   Negative for arthralgias and back pain.  Skin:  Negative for color change and rash.  Neurological:  Positive for headaches. Negative for syncope.  All other systems reviewed and are negative.  Physical Exam Updated Vital Signs BP 117/85 (BP Location: Right Arm)   Pulse 98   Temp 97.9 F (36.6 C) (Oral)   Resp 20   LMP 07/08/2021 (Approximate)   SpO2 100%   Physical Exam Constitutional:      General: She is not in acute distress.    Appearance: She is obese.  HENT:     Head: Normocephalic and atraumatic.  Eyes:     Conjunctiva/sclera: Conjunctivae normal.     Pupils: Pupils are equal, round, and reactive to light.  Cardiovascular:     Rate and Rhythm: Normal rate and regular rhythm.     Pulses: Normal pulses.  Pulmonary:     Effort: Pulmonary effort is normal. No respiratory distress.  Abdominal:     General: There is no distension.     Tenderness: There is no abdominal tenderness.  Skin:    General: Skin is warm and dry.  Neurological:     General: No focal deficit present.     Mental Status: She is alert. Mental status is at baseline.  Psychiatric:        Mood and Affect: Mood normal.        Behavior: Behavior normal.    ED Results / Procedures / Treatments   Labs (all labs ordered are listed, but only abnormal results are displayed) Labs Reviewed  BASIC METABOLIC PANEL    EKG None  Radiology No results found.  Procedures Procedures   Medications Ordered in ED Medications  metoCLOPramide (REGLAN) injection 10 mg (10 mg Intravenous Given 07/26/21 1247)  diphenhydrAMINE (BENADRYL) injection 25 mg (25 mg Intravenous Given 07/26/21 1247)    ED Course  I have reviewed  the triage vital signs and the nursing notes.  Pertinent labs & imaging results that were available during my care of the patient were reviewed by me and considered in my medical decision making (see chart for details).  40 year old female presented ED with confirmed COVID-positive test  yesterday (it was a pharmacy test, but she also tested positive at home on an antigen test).  She is now on day 3 of symptoms.  She does qualify for Paxlovid based on her weight and timing of symptoms -we will check her kidney function here, but I can prescribe this medication to the pharmacy.  She has no competing or contraindicated medications.  I ordered some IV Benadryl and IV Reglan for her headache.  He otherwise demonstrates no signs of acute respiratory distress.  She is not hypoxic. I do not see any emergent indication for hospitalization at this time.  I would avoid steroids given that she has had diabetes in the past.  A work note will be provided.  KROSBY BRING was evaluated in Emergency Department on 07/26/2021 for the symptoms described in the history of present illness. She was evaluated in the context of the global COVID-19 pandemic, which necessitated consideration that the patient might be at risk for infection with the SARS-CoV-2 virus that causes COVID-19. Institutional protocols and algorithms that pertain to the evaluation of patients at risk for COVID-19 are in a state of rapid change based on information released by regulatory bodies including the CDC and federal and state organizations. These policies and algorithms were followed during the patient's care in the ED.   Clinical Course as of 07/26/21 1722  Fri Jul 26, 2021  1308 Patient reassessed.  Headache is improved.  Prescriptions provided to pharmacy.  Okay for discharge [MT]    Clinical Course User Index [MT] Chinaza Rooke, Carola Rhine, MD    Final Clinical Impression(s) / ED Diagnoses Final diagnoses:  [id number]    Rx / DC Orders ED Discharge Orders          Ordered    nirmatrelvir/ritonavir EUA (PAXLOVID) TABS  2 times daily       Note to Pharmacy: Onset of symptoms 07/23/21, patient qualifies for treatment based on BMI, all home drug interactions verified.   07/26/21 1303    diphenhydrAMINE (BENADRYL) 25 MG  tablet  At bedtime PRN        07/26/21 1308    metoCLOPramide (REGLAN) 10 MG tablet  2 times daily PRN        07/26/21 1308             Wyvonnia Dusky, MD 07/26/21 1723

## 2021-07-26 NOTE — Discharge Instructions (Addendum)
You can continue to manage your symptoms with over-the-counter medications, but I did provide you a prescription for Reglan and Benadryl to use over the next 2 to 3 days as needed for severe headache.  Most people have symptoms for about 5 to 6 days with COVID, although it can last as long as 10 days.  Just keep making an effort to drink plenty of water to keep yourself hydrated.

## 2021-09-26 ENCOUNTER — Other Ambulatory Visit (HOSPITAL_COMMUNITY)
Admission: RE | Admit: 2021-09-26 | Discharge: 2021-09-26 | Disposition: A | Discharge status: Home / Self Care | Payer: Medicaid Other | Source: Ambulatory Visit | Attending: Family Medicine | Admitting: Family Medicine

## 2021-09-26 ENCOUNTER — Ambulatory Visit (INDEPENDENT_AMBULATORY_CARE_PROVIDER_SITE_OTHER): Payer: Medicaid Other | Admitting: Student

## 2021-09-26 ENCOUNTER — Other Ambulatory Visit: Payer: Self-pay

## 2021-09-26 ENCOUNTER — Encounter: Payer: Self-pay | Admitting: Student

## 2021-09-26 VITALS — BP 111/84 | HR 101 | Ht 62.0 in | Wt 291.8 lb

## 2021-09-26 DIAGNOSIS — F3289 Other specified depressive episodes: Secondary | ICD-10-CM

## 2021-09-26 DIAGNOSIS — A599 Trichomoniasis, unspecified: Secondary | ICD-10-CM | POA: Diagnosis not present

## 2021-09-26 DIAGNOSIS — N898 Other specified noninflammatory disorders of vagina: Secondary | ICD-10-CM | POA: Diagnosis not present

## 2021-09-26 DIAGNOSIS — R7303 Prediabetes: Secondary | ICD-10-CM | POA: Insufficient documentation

## 2021-09-26 HISTORY — DX: Prediabetes: R73.03

## 2021-09-26 LAB — POCT GLYCOSYLATED HEMOGLOBIN (HGB A1C): Hemoglobin A1C: 5.5 % (ref 4.0–5.6)

## 2021-09-26 LAB — POCT WET PREP (WET MOUNT): Clue Cells Wet Prep Whiff POC: NEGATIVE

## 2021-09-26 MED ORDER — METRONIDAZOLE 500 MG PO TABS: 500.0000 mg | ORAL_TABLET | Freq: Two times a day (BID) | ORAL | 0 refills | Status: AC

## 2021-09-26 MED ORDER — FLUOXETINE HCL 10 MG PO CAPS: 10.0000 mg | ORAL_CAPSULE | Freq: Every day | ORAL | 1 refills | Status: DC

## 2021-09-26 NOTE — Assessment & Plan Note (Deleted)
White discharge with fishy odor according to patient.  White/gray discharge on vaginal exam.  Wet mount positive for trichomonas.  Called patient after visit to relay these results testing for syphilis and HIV today as well.

## 2021-09-26 NOTE — Progress Notes (Signed)
  SUBJECTIVE:  CHIEF COMPLAINT / HPI:   Depression/Anxiety: Back in December, she lost her grandmother, financial stressors, lost her job. She was also left by her best friend that she was dating and has felt that loss in her life. Not currently talking to counselor/therapist, although she has in the past (2016). She had taken Zoloft in the past but it made her tired. She does have family hx of bipolar disorder (cousin). She has never been diagnosed with bipolar disorder. She is interested in both medication and counseling. She notes that she feels overwhelmed and wants to address her depression because she wants to make sure she is well and healthy for her children. Denies any suicidal thoughts or plans. Denies any homicidal ideation.   She is hoping to be tested for STDs since her last sexual partner. She has gotten trichomoniasis from this previous partner. She is unsure if her previous partner has been tested. Whitish discharge, fishy odor x 1 week. She has had BV in the past as well. No pain with urination or intercourse.   Prediabetes: Today's A1c 5.5.   PERTINENT  PMH / PSH: Gastric sleeve  OBJECTIVE:   BP 111/84   Pulse (!) 101   Ht 5\' 2"  (1.575 m)   Wt 291 lb 12.8 oz (132.4 kg)   LMP 09/11/2021   SpO2 100%   BMI 53.37 kg/m    General: NAD, pleasant, able to participate in exam Cardiac: RRR, no murmurs. Respiratory: CTAB, normal effort, No wheezes or rhonchi Vaginal exam: VAGINA: normal appearing vagina with normal color, minimal white/gray discharge CERVIX: normal appearing cervix without discharge or lesions, WET MOUNT done - results: trichomonads.  Psych: Normal affect and mood  ASSESSMENT/PLAN:  Prediabetes A1c previously in the prediabetic range.  A1c today 5.5, patient no longer in prediabetes range.  Depression PHQ-9 score 16 and GAD-7 score of 19 during visit today.  Indicative of severe anxiety and depression.  Denies SI/HI and no red flags on conversation.   Protective factors include her children. No personal history of bipolar disorder.  Previously on Zoloft 25 mg stopped due to drowsiness. Started on Prozac 10 mg daily.  Follow-up in 4 weeks to reevaluate.  Trichimoniasis White discharge with fishy odor according to patient.  White/gray discharge on vaginal exam.  Wet mount positive for trichomonas. Called patient after visit to relay these results.  Started on metronidazole 500 mg twice daily x7 days.  Patient stated over the phone that she would call back with partners information after discussing with him that he is amenable to his information being released. Testing for syphilis and HIV today as well. Patient should be tested in 3 months for test of cure.     Wells Guiles, [place]

## 2021-09-26 NOTE — Patient Instructions (Signed)
It was great to see you today! Thank you for choosing [facility] for your primary care. Melinda Ruiz was seen for depression/anxiety, STD screening for vaginal discharge.   Our plans for today were:  -Depression/anxiety: I have attached a list of counselors/therapy references below for you to call.  I prescribed Prozac 10 mg daily.  This may take up to 4 to 6 weeks to fully take effect but we will readjust your follow-up in 4 weeks if you are not noticing a difference.  As we discussed, medication is able to work better when paired with regular therapy. - STD screening/vaginal discharge: We are testing your for yeast, BV, trich, chlamydia, gonorrhea, syphilis, and HIV today. We will treat these dependent on results.  - You are no longer in the prediabetes range.  Congratulations!  We can discuss weight loss and diet at another time if that is something you would like to discuss.  We are checking some labs today. If they are abnormal, I will call you. If they are normal, I will send you a MyChart message or a letter. If you do not hear about your labs in the next 2 weeks, please let us know.  You should return to our clinic in 4 weeks for depression follow up.   I recommend that you always bring your medications to each appointment as this makes it easy to ensure you are on the correct medications and helps [place] not miss refills when you need them.  Please arrive 15 minutes before your appointment to ensure smooth check in process.  We appreciate your efforts in making this happen.  Take care and seek immediate care sooner if you develop any concerns.   Thank you for allowing me to participate in your care, Wells Guiles, DO 09/26/2021, 10:46 AM PGY-1, [facility]   Therapy and Counseling Resources Most providers on this list will take Medicaid. Patients with commercial insurance or Medicare should contact their insurance company to get a list of in network  providers.  [facility]:Psychiatry and Counseling [address] [facility] [place] [place], [place] [address] [place], [place], [place] [address]      [place] [address]  [place], [place] [address] [phone]  [place] [address] [place]  [place], [place] [address]       [phone]     [facility] (virtual only) [phone]  Jinny Blossom [facility] [address] [place], [place]  Family [facility]:  [place]. [place] [phone]  [facility]:  [place] [address] [phone]  [facility] (under & uninsured) [address], [place] [phone]    [email or web address]    [place] [address]  [place]    [phone]  [facility] [place]     Phone:  [phone]     [place]  [place] [address]      [place], [place] ext [place]: [place], [place], [place]   [place] ([place] therapist) [email or web address]  [place] [address]   [place] [address]    [phone]    [facility]  [place],  [place], [place]   [place]  [place]  [facility]  [address] [place], [place]        [phone]  Open Access/Walk In Clinic under & uninsured  [facility]  [address] [place], [place] Crisis [phone]  [facility] [place],  ([place])   [place], [place]: [phone]) 8:30 - 12; 1 - 2:30  Family [facility] of the [place],  [place], [place]    ([phone]):8:30 - 12; 2 - 3PM  [facility] [employer],  [address],  [place]; [phone]):   Mon - Fri 8 AM - 5 PM  [facility] [place]  MWF 12:30 to 3:00 or call to schedule an appointment  [phone]  Specific Provider options Psychology Today  [email or web address] click on find a therapist  enter your zip code left side and select or tailor a therapist for your specific need.   [facility] Provider Directory [email or web address]  (Medicaid)   Follow all drop down to find a provider  [place] or [email or web address] [address], [place], [place] Recovery support and educational   24- Hour Availability:   [facility]  [address] [place], [place] Crisis [phone]  [facility] of the [employer] [phone]  [place]  [phone]   [place]  [phone] (after hours)  Therapeutic Alternative/Mobile Crisis   [phone]  [place] National Suicide Hotline  [phone] [place]ina Monks)  Call 911 or go to emergency room  [facility]  [phone]);  [place] and [employer]  [phone]); [place], [place], [place], [place], [place], [place], [place]

## 2021-09-26 NOTE — Assessment & Plan Note (Addendum)
PHQ-9 score 16 and GAD-7 score of 19 during visit today.  Indicative of severe anxiety and depression.  Denies SI/HI and no red flags on conversation.  Protective factors include her children. No personal history of bipolar disorder.  Previously on Zoloft 25 mg stopped due to drowsiness. Started on Prozac 10 mg daily.  Follow-up in 4 weeks to reevaluate.

## 2021-09-26 NOTE — Assessment & Plan Note (Addendum)
A1c previously in the prediabetic range.  A1c today 5.5, patient no longer in prediabetes range.

## 2021-09-26 NOTE — Assessment & Plan Note (Addendum)
White discharge with fishy odor according to patient.  White/gray discharge on vaginal exam.  Wet mount positive for trichomonas. Called patient after visit to relay these results.  Started on metronidazole 500 mg twice daily x7 days.  Patient stated over the phone that she would call back with partners information after discussing with him that he is amenable to his information being released. Testing for syphilis and HIV today as well. Patient should be tested in 3 months for test of cure.

## 2021-09-27 LAB — RPR: RPR Ser Ql: NONREACTIVE

## 2021-09-27 LAB — CERVICOVAGINAL ANCILLARY ONLY
Chlamydia: NEGATIVE
Comment: NEGATIVE
Comment: NEGATIVE
Comment: NORMAL
Neisseria Gonorrhea: NEGATIVE
Trichomonas: POSITIVE — AB

## 2021-09-27 LAB — HIV ANTIBODY (ROUTINE TESTING W REFLEX): HIV Screen 4th Generation wRfx: NONREACTIVE

## 2021-10-02 ENCOUNTER — Other Ambulatory Visit: Payer: Self-pay | Admitting: Neurology

## 2021-10-23 NOTE — Progress Notes (Deleted)
  SUBJECTIVE:   CHIEF COMPLAINT / HPI:   ***  PERTINENT  PMH / PSH: ***  OBJECTIVE:  There were no vitals taken for this visit. ***  General: NAD, pleasant, able to participate in exam Cardiac: RRR, no murmurs. Respiratory: CTAB, normal effort, No wheezes, rales or rhonchi Abdomen: Bowel sounds present, nontender, nondistended, no hepatosplenomegaly. Extremities: no edema or cyanosis. Skin: warm and dry, no rashes noted Neuro: alert, no obvious focal deficits Psych: Normal affect and mood  ASSESSMENT/PLAN:  No problem-specific Assessment & Plan notes found for this encounter.    Wells Guiles, DO 10/23/2021, 2:13 AM PGY-***, [facility] {    This will disappear when note is signed, click to select method of visit    :1}

## 2021-10-24 ENCOUNTER — Ambulatory Visit: Payer: Medicaid Other | Admitting: Student

## 2021-11-26 ENCOUNTER — Other Ambulatory Visit (HOSPITAL_COMMUNITY)
Admission: RE | Admit: 2021-11-26 | Discharge: 2021-11-26 | Disposition: A | Discharge status: Home / Self Care | Payer: Medicaid Other | Source: Ambulatory Visit | Attending: Family Medicine | Admitting: Family Medicine

## 2021-11-26 ENCOUNTER — Encounter: Payer: Self-pay | Admitting: Student

## 2021-11-26 ENCOUNTER — Ambulatory Visit (INDEPENDENT_AMBULATORY_CARE_PROVIDER_SITE_OTHER): Payer: Medicaid Other | Admitting: Student

## 2021-11-26 ENCOUNTER — Other Ambulatory Visit: Payer: Self-pay

## 2021-11-26 VITALS — BP 113/81 | HR 101 | Ht 62.0 in | Wt 291.2 lb

## 2021-11-26 DIAGNOSIS — N926 Irregular menstruation, unspecified: Secondary | ICD-10-CM

## 2021-11-26 DIAGNOSIS — N921 Excessive and frequent menstruation with irregular cycle: Secondary | ICD-10-CM

## 2021-11-26 DIAGNOSIS — N939 Abnormal uterine and vaginal bleeding, unspecified: Secondary | ICD-10-CM | POA: Diagnosis not present

## 2021-11-26 LAB — POCT WET PREP (WET MOUNT)
Clue Cells Wet Prep Whiff POC: NEGATIVE
Trichomonas Wet Prep HPF POC: ABSENT

## 2021-11-26 LAB — POCT URINE PREGNANCY: Preg Test, Ur: NEGATIVE

## 2021-11-26 MED ORDER — TERBINAFINE HCL 1 % EX CREA
1.0000 | TOPICAL_CREAM | Freq: Two times a day (BID) | CUTANEOUS | 0 refills | Status: DC
Start: 2021-11-26 — End: 2024-09-22

## 2021-11-26 MED ORDER — TOPIRAMATE 100 MG PO TABS: 100.0000 mg | ORAL_TABLET | Freq: Two times a day (BID) | ORAL | 11 refills | Status: DC

## 2021-11-26 MED ORDER — PANTOPRAZOLE SODIUM 40 MG PO TBEC: 40.0000 mg | DELAYED_RELEASE_TABLET | Freq: Every day | ORAL | 0 refills | Status: DC

## 2021-11-26 MED ORDER — SUMATRIPTAN SUCCINATE 100 MG PO TABS: 100.0000 mg | ORAL_TABLET | Freq: Once | ORAL | 6 refills | Status: DC | PRN

## 2021-11-26 MED ORDER — SUCRALFATE 1 G PO TABS: ORAL_TABLET | ORAL | 1 refills | Status: DC

## 2021-11-26 NOTE — Assessment & Plan Note (Addendum)
Patient presents with 3 days of menorrhagia and dysmenorrhea.  She reports history of menorrhagia and dysmenorrhea however her symptoms are worse with recent episode. She has tried birth controls (Mirena) in the past with no success.  Patient reports history of fibroids10 years ago but most recent ultrasound was negative for fibroids.  She engages in unprotected sex with one partner and  was recently treated for trichomoniasis 2 months ago however she denies any fevers or chills but have pelvic tenderness.  Will obtain labs for trichomonas, gonorrhea, and chlamydia to rule out infectious cause. Other factors to consider include fibroid,premature  menopause or high androgenic state considering high BMI of 53.26.  Patient expressed interest in possible ablation or hysterectomy, we will send referral to OB/GYN to explore more invasive options. She endorses occasional fatigue, no SOB or lightheadedness. Given her history of menorrhagia, will obtain CBC to assess for anemia.   -Completed pelvic exam (Dr. Owens Shark precept) -Obtained lab for CBC, TSH, Gonorrhea, Chlamydia -Obtained wet prep, Trichomonas - Submitted referral to [facility] to explore ablation/hysterectomy options

## 2021-11-26 NOTE — Patient Instructions (Addendum)
It was wonderful to meet you today. Thank you for allowing me to be a part of your care. Below is a short summary of what we discussed at your visit today:  We discussed your increased menstrual bleeding and pain. We did some test which include blood count, thyroid level and wet mount (for Gonorrhea, chlamydia and trichomonas)  I have also sent in referral to see a Ob/gynecologist for further assessment and to discuss ablation/ hysterectomy options.   Sent in refills on your medication   If you have any questions or concerns, please do not hesitate to contact us via phone or MyChart message.   Alen Bleacher, MD [facility]

## 2021-11-26 NOTE — Progress Notes (Signed)
    SUBJECTIVE:   CHIEF COMPLAINT / HPI: Heavy and painful menses  Ms. Brick is a 40 year old female with history of hypertension, migraine, menorrhagia, and thyromegaly who presents with AV and painful masses which she says started November 25th.  Patient report that her period was delayed, usually her period comes on around 20 to the second week of every month.  First 2 days of her menses patient says she has had heavy menses with large clots requiring multiple pad use and soiling her dress on multiple occasion.  Patient reports past history of intermittent heavy periods, however describes this as the heaviest she has experience. Reported associated nausea and significant cramp-like pelvic pain.  Patient tried Mirena BC in the pass with no relieve. She endorses history of fibroid diagnosed at [facility] about 10 years ago.  Sexually active without protection.  She denies any fever or chills. Of note patient's mom had early menopause.   PERTINENT  PMH / PSH: hypertension, migraine, menorrhagia, and thyromegaly  OBJECTIVE:   BP 113/81   Pulse (!) 101   Ht 5\' 2"  (1.575 m)   Wt 132.1 kg   LMP 11/22/2021   SpO2 100%   BMI 53.26 kg/m     Physical Exam General: Alert, well appearing, NAD, Oriented x4 Cardiovascular: RRR, No Murmurs, Normal S2/S2 Respiratory: CTAB, No wheezing or Rales Abdomen: No distension or tenderness Extremities: No edema on extremities GU: see Dr. Roosvelt Harps attestation   Skin: Warm and dry  ASSESSMENT/PLAN:   Menorrhagia Patient presents with 3 days of menorrhagia and dysmenorrhea.  She reports history of menorrhagia and dysmenorrhea however her symptoms are worse with recent episode. She has tried birth controls (Mirena) in the past with no success.  Patient reports history of fibroids10 years ago but most recent ultrasound was negative for fibroids.  She engages in unprotected sex with one partner and  was recently treated for trichomoniasis 2 months  ago however she denies any fevers or chills but have pelvic tenderness.  Will obtain labs for trichomonas, gonorrhea, and chlamydia to rule out infectious cause. Other factors to consider include fibroid,premature  menopause or high androgenic state considering high BMI of 53.26.  Patient expressed interest in possible ablation or hysterectomy, we will send referral to OB/GYN to explore more invasive options. She endorses occasional fatigue, no SOB or lightheadedness. Given her history of menorrhagia, will obtain CBC to assess for anemia.   -Completed pelvic exam (Dr. Owens Shark precept) -Obtained lab for CBC, TSH, Gonorrhea, Chlamydia, Trichomonas -Obtained wet prep - Submitted referral to [facility] to explore ablation/hysterectomy options     Alen Bleacher, MD [place]

## 2021-11-27 LAB — CERVICOVAGINAL ANCILLARY ONLY
Chlamydia: NEGATIVE
Comment: NEGATIVE
Comment: NORMAL
Neisseria Gonorrhea: NEGATIVE

## 2021-11-27 LAB — TSH: TSH: 1.64 u[IU]/mL (ref 0.450–4.500)

## 2022-01-06 ENCOUNTER — Ambulatory Visit: Payer: Medicaid Other

## 2022-01-06 ENCOUNTER — Other Ambulatory Visit: Payer: Self-pay | Admitting: Student

## 2022-02-24 ENCOUNTER — Ambulatory Visit (INDEPENDENT_AMBULATORY_CARE_PROVIDER_SITE_OTHER): Payer: Medicaid Other | Admitting: Obstetrics and Gynecology

## 2022-02-24 ENCOUNTER — Encounter: Payer: Self-pay | Admitting: Obstetrics and Gynecology

## 2022-02-24 ENCOUNTER — Other Ambulatory Visit: Payer: Self-pay

## 2022-02-24 VITALS — BP 148/93 | HR 104 | Wt 292.2 lb

## 2022-02-24 DIAGNOSIS — N920 Excessive and frequent menstruation with regular cycle: Secondary | ICD-10-CM | POA: Diagnosis not present

## 2022-02-24 NOTE — Progress Notes (Signed)
° °  CC: heavy periods Subjective:    Patient ID: Melinda Ruiz, female    DOB: Oct 03, 1981, 41 y.o.   MRN: [id number]  HPI 41 yo G3P2012, c/s x 2,  seen for discussion of heavy menses.  She has tried hormonal patch, progesterone IUD, depo provera, OCP and still has heavy menses.  She has regular menses lasting 4-5 days, but they are heavy for the full menstruation.  She uses greater than 10 pads on her heaviest day.  Pt has had a tubal ligation and currently is a smoker.  The patient is not really interested in IUD again or uterine ablation.   Review of Systems     Objective:   Physical Exam Vitals:   02/24/22 1443  BP: (!) 148/93  Pulse: (!) 104         Assessment & Plan:   1. Menorrhagia with regular cycle Discussed with patient due to her age, heavy bleeding and elevated BMI that she would need endometrial biopsy  for sampling prior to initiating treatment. Options currently are limited as she has tried most treatment modalities outside of surgery.    Due to previous  c/s x 2 and high BMI she may be a difficult surgical candidate as well.  Awaiting current  ultrasound and endometrial biopsy before moving forward with any plan. - US PELVIC COMPLETE WITH TRANSVAGINAL; Future - CBC  Endometrial biopsy in 2 weeks.  Pt declined the procedure today.  I spent 20 minutes dedicated to the care of this patient including previsit review of records, face to face time with the patient discussing treatment options ,history and post visit testing.   Griffin Basil, MD Faculty Attending, Center for [facility]

## 2022-02-24 NOTE — Progress Notes (Signed)
Patient in for concerns regarding her cycle. States she has always had bad cycles but recently they have increased substantially. Reports she wears 2 pads at one time due to bleeding through them, changing pads out every 2 hours.  Reports trying patch, depo, IUD, and OCP's none of which helped. Blood pressure today is 148/93, states she is followed by PCP for this.   Altha Harm, CMA

## 2022-02-25 ENCOUNTER — Other Ambulatory Visit: Payer: Medicaid Other

## 2022-02-27 ENCOUNTER — Ambulatory Visit
Admission: RE | Admit: 2022-02-27 | Discharge: 2022-02-27 | Disposition: A | Discharge status: Home / Self Care | Payer: Medicaid Other | Source: Ambulatory Visit | Attending: Obstetrics and Gynecology | Admitting: Obstetrics and Gynecology

## 2022-02-27 ENCOUNTER — Other Ambulatory Visit: Payer: Self-pay

## 2022-02-27 DIAGNOSIS — N920 Excessive and frequent menstruation with regular cycle: Secondary | ICD-10-CM | POA: Diagnosis present

## 2022-03-17 ENCOUNTER — Ambulatory Visit: Payer: Medicaid Other | Admitting: Obstetrics and Gynecology

## 2022-04-07 NOTE — Progress Notes (Signed)
?  ?  ?  GYNECOLOGY OFFICE PROCEDURE NOTE ?  ?Melinda Ruiz is a 41 y.o. [id number] here for endometrial biopsy for AUB. Recent ultrasound also showed overall normal findings, EL 75m.  Today, she reports no concerning symptoms. Of note, pap on 11/2020 was normal, negative HPV. ?  ?ENDOMETRIAL BIOPSY     ?The indications for endometrial biopsy were reviewed.   Risks of the biopsy including cramping, bleeding, infection, uterine perforation, inadequate specimen and need for additional procedures were discussed. Offered alternative of hysteroscopy, dilation and curettage in OR. The patient states she understands the R/B/I/A and agrees to undergo procedure today. Urine pregnancy test was Negative. Consent was signed. Time out was performed.  ?  ?Patient was positioned in dorsal lithotomy position. A vaginal speculum was placed.  The cervix was visualized and was prepped with Betadine.  A single-toothed tenaculum was placed on the anterior lip of the cervix to stabilize it. The 3 mm pipelle was easily introduced into the endometrial cavity without difficulty to a depth of 8 cm, and a Scant amount of tissue was obtained after two passes and sent to pathology. The instruments were removed from the patient's vagina. Minimal bleeding from the cervix was noted. The patient tolerated the procedure well. ?  ?Patient was given post procedure instructions.  Will follow up pathology and manage accordingly; patient will be contacted with results and recommendations.  Routine preventative health maintenance measures emphasized. ?  ?  ?  ?Melinda Gunning MD, FACOG ?Obstetrician &Social research officer, government Faculty Practice ?[facility]? ?

## 2022-04-09 ENCOUNTER — Other Ambulatory Visit (HOSPITAL_COMMUNITY)
Admission: RE | Admit: 2022-04-09 | Discharge: 2022-04-09 | Disposition: A | Discharge status: Home / Self Care | Payer: Medicaid Other | Source: Ambulatory Visit | Attending: Obstetrics and Gynecology | Admitting: Obstetrics and Gynecology

## 2022-04-09 ENCOUNTER — Encounter: Payer: Self-pay | Admitting: Obstetrics and Gynecology

## 2022-04-09 ENCOUNTER — Ambulatory Visit (INDEPENDENT_AMBULATORY_CARE_PROVIDER_SITE_OTHER): Payer: Medicaid Other | Admitting: Obstetrics and Gynecology

## 2022-04-09 VITALS — BP 123/74 | HR 111 | Ht 60.0 in | Wt 286.0 lb

## 2022-04-09 DIAGNOSIS — N920 Excessive and frequent menstruation with regular cycle: Secondary | ICD-10-CM | POA: Insufficient documentation

## 2022-04-09 LAB — POCT PREGNANCY, URINE: Preg Test, Ur: NEGATIVE

## 2022-04-09 MED ORDER — ACETAMINOPHEN 500 MG PO TABS: 500.0000 mg | ORAL_TABLET | Freq: Once | ORAL | Status: AC

## 2022-04-09 NOTE — Addendum Note (Signed)
Addended by: Radene Gunning A on: 04/09/2022 04:55 PM ? ? Modules accepted: Orders ? ?

## 2022-04-14 LAB — SURGICAL PATHOLOGY

## 2022-04-21 ENCOUNTER — Telehealth: Payer: Self-pay | Admitting: General Practice

## 2022-04-21 ENCOUNTER — Encounter: Payer: Self-pay | Admitting: Obstetrics and Gynecology

## 2022-04-21 NOTE — Addendum Note (Signed)
Addended by: Radene Gunning A on: 04/21/2022 12:15 PM ? ? Modules accepted: Orders ? ?

## 2022-04-21 NOTE — Progress Notes (Deleted)
GYNECOLOGY OFFICE VISIT NOTE  History:   Melinda Ruiz is a 41 y.o. [id number] here today for review of options in setting of menorrhagia.   She has tried: hormonal patch, progesterone IUD, depo provera, OCP and still has heavy menses.   She has regular cycles but they are heavy for the full 5 days, using 10 pads on the heaviest day. She has had a tubal ligation. She does smoke tobacco.   She denies any abnormal vaginal discharge, bleeding, pelvic pain or other concerns.     Past Medical History:  Diagnosis Date   Acid reflux    Anemia, during pregnancy    Bipolar disorder ([place])    Chiari I malformation ([place])    Complication of anesthesia    "Oxygen level drops"   Depression    Fibroid    Gallbladder problem    Hypertension    Kidney stones 2010   Lactose intolerance    Migraine    Osteoarthritis    Prediabetes 09/26/2021   Schizophrenia ([place])    Sleep apnea    Ulcer of abdomen wall ([place])     Past Surgical History:  Procedure Laterality Date   BREATH TEK H PYLORI N/A 05/31/2015   Procedure: BREATH TEK Kandis Ban;  Surgeon: Greer Pickerel, MD;  Location: [facility];  Service: General;  Laterality: N/A;   CARPAL TUNNEL RELEASE  02/03/2012   Procedure: CARPAL TUNNEL RELEASE;  Surgeon: Meredith Pel, MD;  Location: [facility];  Service: Orthopedics;  Laterality: Left;  left carpal tunnel release   CARPAL TUNNEL RELEASE  01/11/2013   Procedure: CARPAL TUNNEL RELEASE;  Surgeon: Meredith Pel, MD;  Location: [facility];  Service: Orthopedics;  Laterality: Right;  Hand Set, Small Retractors, Lead Hand   CESAREAN SECTION  2006, 2011   CHOLECYSTECTOMY  06/2011   cystectomy     GANGLION CYST EXCISION  02/03/2012   Procedure: REMOVAL GANGLION OF WRIST;  Surgeon: Meredith Pel, MD;  Location: [facility];  Service: Orthopedics;  Laterality: Left;  left dorsal ganglion cyst excision   LAPAROSCOPIC GASTRIC SLEEVE RESECTION N/A 10/02/2015   Procedure: LAPAROSCOPIC GASTRIC SLEEVE RESECTION;   Surgeon: Greer Pickerel, MD;  [facility]: [facility];  Service: General;  Laterality: N/A;   Lymp Glands Removed--under arm bil     Lympectomy     MULTIPLE TOOTH EXTRACTIONS  2003   and wisdom teeth   Pilonidial Cyst Removed  1990's   TUBAL LIGATION  2011   UPPER GI ENDOSCOPY  10/02/2015   Procedure: UPPER GI ENDOSCOPY;  Surgeon: Greer Pickerel, MD;  [facility]: [facility];  Service: General;;    The following portions of the patient's history were reviewed and updated as appropriate: allergies, current medications, past family history, past medical history, past social history, past surgical history and problem list.   Health Maintenance:   Diagnosis  Date Value Ref Range Status  12/03/2020   Final   - Negative for intraepithelial lesion or malignancy (NILM)     Review of Systems:  Pertinent items noted in HPI and remainder of comprehensive ROS otherwise negative.  Physical Exam:  There were no vitals taken for this visit. CONSTITUTIONAL: Well-developed, well-nourished female in no acute distress.  HEENT:  Normocephalic, atraumatic. External right and left ear normal. No scleral icterus.  NECK: Normal range of motion, supple, no masses noted on observation SKIN: No rash noted. Not diaphoretic. No erythema. No pallor. MUSCULOSKELETAL: Normal range of motion. No edema noted. NEUROLOGIC: Alert and oriented  to person, place, and time. Normal muscle tone coordination. No cranial nerve deficit noted. PSYCHIATRIC: Normal mood and affect. Normal behavior. Normal judgment and thought content.  CARDIOVASCULAR: Normal heart rate noted RESPIRATORY: Effort and breath sounds normal, no problems with respiration noted ABDOMEN: No masses noted. No other overt distention noted.    PELVIC: {Blank single:19197::"Deferred","Normal appearing external genitalia; normal urethral meatus; normal appearing vaginal mucosa and cervix.  No abnormal discharge noted.  Normal uterine size, no other palpable masses, no uterine or  adnexal tenderness. Performed in the presence of a chaperone"}  Labs and Imaging No results found for this or any previous visit (from the past 168 hour(s)). No results found.  Assessment and Plan:   1. Menorrhagia with regular cycle *** - She has limited surgical options. While an ablation could be done, she would be at risk for post-ablation tubal syndrome and I am also concerned about performing an ablation on her due to risk of hyperplasia and difficulty with evaluation in the s/o PMB.  - If she were to proceed with hysterectomy, due to a variety of factors as noted on EMB (I.e. surgical history, BMI, etc), I would recommend it be done with gyn/onc.  - She could try any progesterone only option again as well as TXA. I would not recommend estrogen containing options due to age/tobacco use as well as HTN, migraine history.    Diagnoses and all orders for this visit:  Menorrhagia with regular cycle    Routine preventative health maintenance measures emphasized. Please refer to After Visit Summary for other counseling recommendations.   No follow-ups on file.  Radene Gunning, MD, [place] for [facility], [place]

## 2022-04-21 NOTE — Telephone Encounter (Signed)
Called patient concerning Dr Rhona Leavens message to her regarding her results & updated plan of care.  ?Patient asked about reason for referral. Discussed and explained to patient. Patient verbalized understanding & states she is unsure about hysterectomy at this time and will therefore decline referral. Patient would like to keep appt on Friday to discuss other options. ?

## 2022-04-25 ENCOUNTER — Ambulatory Visit: Payer: Medicaid Other | Admitting: Obstetrics and Gynecology

## 2022-04-25 DIAGNOSIS — N920 Excessive and frequent menstruation with regular cycle: Secondary | ICD-10-CM

## 2022-06-03 ENCOUNTER — Encounter: Payer: Self-pay | Admitting: *Deleted

## 2022-08-06 ENCOUNTER — Encounter (INDEPENDENT_AMBULATORY_CARE_PROVIDER_SITE_OTHER): Payer: Self-pay

## 2022-08-26 ENCOUNTER — Ambulatory Visit
Admission: RE | Admit: 2022-08-26 | Discharge: 2022-08-26 | Disposition: A | Discharge status: Home / Self Care | Payer: Medicaid Other | Source: Ambulatory Visit | Attending: Urgent Care | Admitting: Urgent Care

## 2022-08-26 VITALS — BP 134/83 | HR 100 | Temp 97.7°F | Resp 18

## 2022-08-26 DIAGNOSIS — U071 COVID-19: Secondary | ICD-10-CM | POA: Insufficient documentation

## 2022-08-26 DIAGNOSIS — Z1152 Encounter for screening for COVID-19: Secondary | ICD-10-CM | POA: Diagnosis not present

## 2022-08-26 MED ORDER — PAXLOVID (300/100) 20 X 150 MG & 10 X 100MG PO TBPK
ORAL_TABLET | ORAL | 0 refills | Status: DC
Start: 2022-08-26 — End: 2022-08-26

## 2022-08-26 MED ORDER — PAXLOVID (300/100) 20 X 150 MG & 10 X 100MG PO TBPK: ORAL_TABLET | ORAL | 0 refills | Status: DC

## 2022-08-26 MED ORDER — PROMETHAZINE-DM 6.25-15 MG/5ML PO SYRP: 5.0000 mL | ORAL_SOLUTION | Freq: Three times a day (TID) | ORAL | 0 refills | Status: DC | PRN

## 2022-08-26 NOTE — ED Triage Notes (Signed)
Pt here with positive home covid test and cough, congestion, SOB and body aches since yesterday.

## 2022-08-26 NOTE — ED Provider Notes (Signed)
[place]   MRN: [id number] DOB: 1981/01/23  Subjective:   Melinda Ruiz is a 41 y.o. female presenting for 1 day history of acute onset persistent coughing, sinus congestion, shortness of breath and body pains.  Patient did a rapid COVID test and was positive.  She needs a confirmation test and a note for work.  No history of respiratory disorders.  No history of kidney disease but patient would like to make sure she gets blood work so she can take [place].  Patient is also a smoker.   Current Facility-Administered Medications:    acetaminophen (TYLENOL) tablet 500 mg, 500 mg, Oral, Once, Radene Gunning, MD  Current Outpatient Medications:    acetaminophen (TYLENOL) 500 MG tablet, Take 500 mg by mouth every 6 (six) hours as needed., Disp: , Rfl:    diphenhydrAMINE (BENADRYL) 25 MG tablet, Take 1 tablet (25 mg total) by mouth at bedtime as needed for up to 10 doses., Disp: 10 tablet, Rfl: 0   EPINEPHrine 0.3 mg/0.3 mL IJ SOAJ injection, Inject 0.3 mg into the muscle as needed for anaphylaxis (difficulty breathing, wheezing, tongue swelling, difficulty swallowing or facial swelling)., Disp: 1 each, Rfl: 0   FLUoxetine (PROZAC) 10 MG capsule, Take 1 capsule (10 mg total) by mouth daily., Disp: 30 capsule, Rfl: 1   pantoprazole (PROTONIX) 40 MG tablet, Take 1 tablet (40 mg total) by mouth daily., Disp: 90 tablet, Rfl: 0   sucralfate (CARAFATE) 1 g tablet, TAKE 1 TABLET BY MOUTH EVERY 6 HOURS AS NEEDED FOR ULCERS. Can crush and mix with water or applesauce if needed., Disp: 120 tablet, Rfl: 1   SUMAtriptan (IMITREX) 100 MG tablet, Take 1 tablet (100 mg total) by mouth once as needed for up to 1 dose for migraine. May repeat in 2 hours if headache persists or recurs., Disp: 12 tablet, Rfl: 6   terbinafine (LAMISIL) 1 % cream, Apply 1 application topically 2 (two) times daily., Disp: 30 g, Rfl: 0   topiramate (TOPAMAX) 100 MG tablet, Take 1 tablet (100 mg total) by  mouth 2 (two) times daily., Disp: 60 tablet, Rfl: 11   Allergies  Allergen Reactions   Bactrim [Sulfamethoxazole-Trimethoprim] Itching   Ibuprofen Itching   Penicillins Hives    .Has patient had a PCN reaction causing immediate rash, facial/tongue/throat swelling, SOB or lightheadedness with hypotension: No Has patient had a PCN reaction causing severe rash involving mucus membranes or skin necrosis: No Has patient had a PCN reaction that required hospitalization No Has patient had a PCN reaction occurring within the last 10 years: No If all of the above answers are "NO", then may proceed with Cephalosporin use.   Oxycodone-Acetaminophen Itching    Past Medical History:  Diagnosis Date   Acid reflux    Anemia, during pregnancy    Bipolar disorder ([place])    Chiari I malformation ([place])    Complication of anesthesia    "Oxygen level drops"   Depression    Fibroid    Gallbladder problem    Hypertension    Kidney stones 2010   Lactose intolerance    Migraine    Osteoarthritis    Prediabetes 09/26/2021   Schizophrenia ([place])    Sleep apnea    Ulcer of abdomen wall [facility])      Past Surgical History:  Procedure Laterality Date   BREATH TEK H PYLORI N/A 05/31/2015   Procedure: BREATH TEK Kandis Ban;  Surgeon: Greer Pickerel, MD;  [facility];  Service: General;  Laterality: N/A;   CARPAL TUNNEL RELEASE  02/03/2012   Procedure: CARPAL TUNNEL RELEASE;  Surgeon: Meredith Pel, MD;  Location: [place];  Service: Orthopedics;  Laterality: Left;  left carpal tunnel release   CARPAL TUNNEL RELEASE  01/11/2013   Procedure: CARPAL TUNNEL RELEASE;  Surgeon: Meredith Pel, MD;  Location: [facility];  Service: Orthopedics;  Laterality: Right;  Hand Set, Small Retractors, Lead Hand   CESAREAN SECTION  2006, 2011   CHOLECYSTECTOMY  06/2011   cystectomy     GANGLION CYST EXCISION  02/03/2012   Procedure: REMOVAL GANGLION OF WRIST;  Surgeon: Meredith Pel, MD;  Location: [facility];  Service:  Orthopedics;  Laterality: Left;  left dorsal ganglion cyst excision   LAPAROSCOPIC GASTRIC SLEEVE RESECTION N/A 10/02/2015   Procedure: LAPAROSCOPIC GASTRIC SLEEVE RESECTION;  Surgeon: Greer Pickerel, MD;  [facility];  Service: General;  Laterality: N/A;   Lymp Glands Removed--under arm bil     Lympectomy     MULTIPLE TOOTH EXTRACTIONS  2003   and wisdom teeth   Pilonidial Cyst Removed  1990's   TUBAL LIGATION  2011   UPPER GI ENDOSCOPY  10/02/2015   Procedure: UPPER GI ENDOSCOPY;  Surgeon: Greer Pickerel, MD;  [facility];  Service: General;;    Family History  Problem Relation Age of Onset   Kidney cancer Father    Diabetes Mellitus II Father    Hypertension Father    Arthritis Mother    Diabetes Mellitus II Mother    Hypertension Mother    Diabetes Mother    Anxiety disorder Mother    Anesthesia problems Neg Hx     Social History   Tobacco Use   Smoking status: Every Day    Packs/day: 0.25    Types: Cigarettes    Last attempt to quit: 10/01/2009    Years since quitting: 12.9   Smokeless tobacco: Never  Vaping Use   Vaping Use: Never used  Substance Use Topics   Alcohol use: Yes    Comment: has a drink twice weekly   Drug use: No    ROS   Objective:   Vitals: BP 134/83   Pulse 100   Temp 97.7 F (36.5 C)   Resp 18   SpO2 98%   Physical Exam Constitutional:      General: She is not in acute distress.    Appearance: Normal appearance. She is well-developed. She is obese. She is not ill-appearing, toxic-appearing or diaphoretic.  HENT:     Head: Normocephalic and atraumatic.     Nose: Nose normal.     Mouth/Throat:     Mouth: Mucous membranes are moist.  Eyes:     General: No scleral icterus.       Right eye: No discharge.        Left eye: No discharge.     Extraocular Movements: Extraocular movements intact.  Cardiovascular:     Rate and Rhythm: Normal rate and regular rhythm.     Heart sounds: Normal heart sounds. No murmur heard.    No  friction rub. No gallop.  Pulmonary:     Effort: Pulmonary effort is normal. No respiratory distress.     Breath sounds: No stridor. No wheezing, rhonchi or rales.  Chest:     Chest wall: No tenderness.  Skin:    General: Skin is warm and dry.  Neurological:     General: No focal deficit present.  Mental Status: She is alert and oriented to person, place, and time.  Psychiatric:        Mood and Affect: Mood normal.        Behavior: Behavior normal.     Assessment and Plan :   PDMP not reviewed this encounter.  1. Clinical diagnosis of COVID-19   2. Encounter for screening for COVID-19    Start Paxlovid, basic metabolic panel pending.  Confirmation COVID test pending. Deferred imaging given clear cardiopulmonary exam, hemodynamically stable vital signs. Will manage COVID-19 with supportive care otherwise. Counseled patient on nature of COVID-19 including modes of transmission, diagnostic testing, management and supportive care.  Offered symptomatic relief. Counseled patient on potential for adverse effects with medications prescribed/recommended today, strict ER and return-to-clinic precautions discussed, patient verbalized understanding.     Jaynee Eagles, PA-C 08/26/22 1406

## 2022-08-26 NOTE — Discharge Instructions (Addendum)
For sore throat or cough try using a honey-based tea. Use 3 teaspoons of honey with juice squeezed from half lemon. Place shaved pieces of ginger into 1/2-1 cup of water and warm over stove top. Then mix the ingredients and repeat every 4 hours as needed. Please take ibuprofen '600mg'$  every 6 hours with food alternating with OR taken together with Tylenol '500mg'$ -'650mg'$  every 6 hours for throat pain, fevers, aches and pains. Hydrate very well with at least 2 liters of water. Eat light meals such as soups (chicken and noodles, vegetable, chicken and wild rice).  Do not eat foods that you are allergic to.  Taking an antihistamine like Zyrtec can help against postnasal drainage, sinus congestion which can cause sinus pain, sinus headaches, throat pain, painful swallowing, coughing.  You can take this together with pseudoephedrine (Sudafed) at a dose of 60 mg 3 times a day or twice daily as needed for the same kind of nasal drip, congestion.

## 2022-08-27 LAB — BASIC METABOLIC PANEL
BUN/Creatinine Ratio: 11 (ref 9–23)
BUN: 9 mg/dL (ref 6–20)
CO2: 15 mmol/L — ABNORMAL LOW (ref 20–29)
Calcium: 9.7 mg/dL (ref 8.7–10.2)
Chloride: 108 mmol/L — ABNORMAL HIGH (ref 96–106)
Creatinine, Ser: 0.8 mg/dL (ref 0.57–1.00)
Glucose: 78 mg/dL (ref 70–99)
Potassium: 4.6 mmol/L (ref 3.5–5.2)
Sodium: 141 mmol/L (ref 134–144)
eGFR: 96 mL/min/{1.73_m2} (ref >59)

## 2022-08-27 LAB — SARS CORONAVIRUS 2 (TAT 6-24 HRS): SARS Coronavirus 2: POSITIVE — AB

## 2022-11-11 ENCOUNTER — Encounter: Payer: Medicaid Other | Admitting: Family Medicine

## 2022-12-24 ENCOUNTER — Encounter: Payer: Self-pay | Admitting: Obstetrics and Gynecology

## 2022-12-24 ENCOUNTER — Ambulatory Visit: Payer: Medicaid Other | Admitting: Obstetrics and Gynecology

## 2022-12-24 DIAGNOSIS — Z8619 Personal history of other infectious and parasitic diseases: Secondary | ICD-10-CM | POA: Insufficient documentation

## 2022-12-25 NOTE — Progress Notes (Signed)
Patient did not keep her GYN appointment for 12/24/2022.  Durene Romans MD Attending Center for [facility] [employer])

## 2022-12-30 NOTE — Patient Instructions (Addendum)
It was wonderful to see you today.  Please bring ALL of your medications with you to every visit.   Today we talked about:  Today at your annual preventive visit we talked about the following measures: I recommend 150 minutes of exercise per week-try 30 minutes 5 days per week We discussed reducing sugary beverages (like soda and juice) and increasing leafy greens and whole fruits.  We discussed avoiding tobacco and alcohol.  I recommend avoiding illicit substances.  Your blood pressure is near goal of <130/80.  See below for therapy options.  I provided you with a packet for advanced directives, you can fill this out at your convenience.  You can return here to have it notarized or take it elsewhere to have a notarized. Please bring back once completed so that we can scan into your chart.   We are doing A1c today which is a screening test for diabetes.  I will let you know the results.  We are doing lab work today to check your cholesterol, blood count, iron level and thyroid. I will send you a MyChart message if you have MyChart. Otherwise, I will give you a call for abnormal results or send a letter if everything returned back normal. If you don't hear from me in 2 weeks, please call the office.    -We are checking for sexually transmitted infections including chlamydia, gonorrhea, trichomonas, HIV, syphilis and hepatitis B. I will let you know of the results via MyChart or telephone call. We are also checking for bacterial vaginosis and yeast, which are not sexually transmitted infections.  -You should abstain from sexual activity until we have the results. If your test is positive for a sexually transmitted infection, it is important that both you and your partner are both treated.  -It is always important to use barrier protection, such as condoms, to help prevent sexually transmitted infections.    Tobacco use is damaging to your body. It increases your risk of stroke, heart attack,  lung cancer, and serious lung disease in the future. It also reduces your fertility.   Quitting tobacco is the best thing for your health but is a challenge---nicotine, a chemical in cigarettes, is highly addictive.   You can call 1 800 QUIT NOW ([phone])---you will be connected with a Careers information officer. They can also mail you nicotine gums, lozenges, and patches to quit.   Ask me about patches (which you wear all day) and gums (which you use when you have a craving) to help you quit.   There are safe, effective medications to help you quit--  Varencline---also called Chantix---- is the most common medication used to help people stop smoking. It starts a low dose and is increased. I recommended choosing a quit date then starting the medication 8 days before this. Side effects include mild headache, difficulty sleeping, and odd dreams. The medication is typically very well tolerated.   Bupropion---also called Zyban---- is started 1 week before your quit date. You take 1 pill for three days then increase to 1 pill twice per day. Side effects include a mild headache and anxiety---this usually goes away. Some patients experience weight loss.     Therapy and Counseling Resources Most providers on this list will take Medicaid. Patients with commercial insurance or Medicare should contact their insurance company to get a list of in network providers.  [facility] (takes children) Location 1: [address] [place], [place] [address] Location 2: [address] [place],  [place] [address] [phone]   [facility] (spanish speaking therapist available)(habla espanol)(take medicare and medicaid)  [address] [place], [place], [place] [address], [email or web address] [email or web address] [phone]  [facility]iat[facility] Counseling [address] [facility] [address] [place], [place] [address] [phone]  [facility]   [address] Suite [place], [place] [address]       [phone]  [facility] (spanish available) [address]  [place], [place] [address] [phone]  [facility] (take [facility] and medicare) [address]  [place], [place] [address]       [phone]     [facility] (virtual only) [phone]  [name] Kussmaul [facility] [address] [place], [place] [phone]  [facility]:  [address] [place] [phone]  [facility]:  [address] [place]ien[place] [phone]  [facility] (under & uninsured) [address]   [place] [phone]    [email or web address]    [facility] [address]  [place]    [phone]  [facility] -[address]     Phone:  [phone]     [facility]  [address]  [phone]   [facility] [address]      [place], [place] [phone]  [facility]: [address] [place], [place], [place]  [phone]   [facility] (Spanish therapist) [email or web address]  [address] [place]  [address]   [place] [address]    [phone]    [facility]   [address],  [place], [place]  [phone]   [facility]  [address] [place]  [phone]  [facility]  [address] [place], [place]        [phone]  Open Access/Walk In Clinic under & uninsured  [facility]  [address] [place], [place] [phone] Crisis [phone]  [facility] [place],  (Spanish)   [address], [place]: [phone]) 8:30 - 12; 1 - 2:30  [facility] [employer],  [address] [place], [place]    [phone]):8:30 - 12; 2 - 3PM  [facility] [employer],  [address]  [place]; [phone]):   Mon -  Fri 8 AM - 5 PM  [facility] [address] [place]  MWF 12:30 to 3:00 or call to schedule an appointment  [phone]  Specific Provider options Psychology Today  [email or web address] click on find a therapist  enter your zip code left side and select or tailor a therapist for your specific need.   [facility] Provider Directory [email or web address]  (Medicaid)   Follow all drop down to find a provider  Social Support program [facility] [phone] or PhotoS[employer].pl [address], [place], [place] Recovery support and educational   24- Hour Availability:   [facility]  [address] [place], [place] [phone] Crisis [phone]  [facility] [phone]  [facility]  [phone]   [facility]  [phone] (after hours)  [facility]/Mobile Crisis   [phone]  [place] National Suicide Hotline  [phone] Len Childs)  Call 911 or go to emergency room  [facility]  [phone]);  [place] and [employer]  (  [phone]); [place], [place], [place], [place], [place], [place], [place]  Thank you for coming to your visit as scheduled. We have had a large "no-show" problem lately, and this significantly limits our ability to see and care for patients. As a friendly reminder- if you cannot make your appointment please call to cancel. We do have a no show policy for those who do not cancel within 24 hours. Our policy is that if you miss or fail to cancel an appointment within 24 hours, 3 times in a 31-month period, you may be dismissed from our clinic.   Thank you for choosing [facility].   Please call [phone] with any questions about today's appointment.  Please be sure to schedule follow up at the front  desk before you  leave today.   Sabino Dick, DO PGY-3 Family Medicine

## 2022-12-30 NOTE — Progress Notes (Signed)
SUBJECTIVE:   Chief compliant/HPI: annual examination  Melinda Ruiz is a 42 y.o. who presents today for an annual exam.   History tabs reviewed and updated.  Socially: Lives with two kids and boyfriend. Son is 51, daughter is 13. Feels safe in her relationship. Drives buses for work.  Smoking: 1 pack every 2-3 days. Smoking off and on for about 2 years.   She does have hx of anemia. Endorses "very heavy" periods, "it was to the point where they were talking about doing hysterectomy". Her pads last about 4-5 days. She goes through 5 pads at the most daily (the extra long heavy duty pads).   She feels that her mood has been down. No SI. She did not like the way Prozac made her feel, felt like her heart was beating fast.   She would like a sleep study. Her significant other notes that she doesn't breath well at night. She also does not feel well rested. 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  OBJECTIVE:   BP 135/80   Pulse (!) 106   Ht 5' (1.524 m)   Wt 288 lb 6.4 oz (130.8 kg)   LMP 12/10/2022 (Exact Date)   SpO2 100%   BMI 56.32 kg/m   General: Awake, alert, oriented, in no acute distress, pleasant and cooperative with examination HEENT: Normocephalic, atraumatic, nares patent, dentition is fair with multiple caries, oropharynx without erythema or exudates, unable to visualize TM's due to cerumen impaction, no thyroid nodules palpated Cardio: RRR without murmur Respiratory: CTAB without wheezing/rhonchi/rales Abdomen: Obese, soft, non-tender to palpation of all quadrants, non-distended, no rebound/guarding MSK: Able to move all extremities spontaneously, good muscle strength, no abnormalities. Normal gait  Extremities: without edema Neuro: Speech is clear and intact, no focal deficits, no facial asymmetry, follows commands  Psych: Normal mood and affect    12/31/2022    2:10 PM 04/09/2022    2:53 PM 02/27/2022   10:19 AM  Depression screen PHQ 2/9   Decreased Interest 3 0 2  Down, Depressed, Hopeless 3 0 2  PHQ - 2 Score 6 0 4  Altered sleeping 3 1 1   Tired, decreased energy 3 1 2   Change in appetite 1 0 3  Feeling bad or failure about yourself  3 0 0  Trouble concentrating 2 0 0  Moving slowly or fidgety/restless 2 1 0  Suicidal thoughts 0 0 0  PHQ-9 Score 20 3 10   Difficult doing work/chores Very difficult      ASSESSMENT/PLAN:   Tachycardia Mildly tachycardic with HR 106 today. She does endorse hx of anemia and has heavy menses. She has been prescribed metoprolol for her tachycardia (and BP) in the past.  -Check CBC, ferritin, TSH -F/u at next visit    Depression Has tried Zoloft and Prozac in the past, did not like either of these.  She continues to have have depression, PHQ-9 score is elevated at 20 today. No SI/HI.  -Resources for therapy provided in AVS  -Start Effexor 37.5 mg daily, increase to 75 mg daily if tolerating well -F/u in 2 weeks   Tobacco use disorder Discussed smoking cessation, she seems pre-contemplative. Provided with resources for different cessation options in AVS including tobacco cessation hotline.  Hypertension, benign Near goal of <130/80 without medications.  -Monitor at f/u, consider starting ARB if persistently elevated  BMI 50.0-59.9, adult (HCC) Had planned for A1c check today but unfortunately this was not collected. Will  attempt at f/u. She is s/p gastric sleeve. She has concern for OSA given fatigue, nocturnal awakenings, snoring. Could also have component of OHS. Would benefit from sleep study. -Check lipids -Encouraged lifestyle modifications  -Consider nutrition referral at f/u if patient interested -Split night sleep study ordered   Annual Examination  See AVS for age appropriate recommendations.   PHQ score 20, reviewed and discussed.  Blood pressure reviewed and at goal.  Asked about intimate partner violence and resources given as appropriate  For contraception she has  tubes tied.   Considered the following items based upon USPSTF recommendations: Diabetes screening: discussed and ordered Screening for elevated cholesterol: discussed and ordered HIV testing: discussed and ordered Hepatitis C: discussed and ordered Hepatitis B: discussed and ordered Syphilis if at high risk: discussed and ordered GC/CT  ordered Reviewed risk factors for latent tuberculosis and not indicated  Discussed family history, BRCA testing not indicated. Tool used to risk stratify was NIH Breast Cancer Risk Assessment Tool.  Cervical cancer screening: Pap 11/2020 NILM, negative for high risk HPV  Breast cancer screening: discussed potential benefits, risks including overdiagnosis and biopsy, elected to wait until age 72  Colorectal cancer screening: not applicable given age.  if age 36 or over.   Follow up in 1-2 weeks for depression f/u and A1c.   Sabino Dick, DO [facility]

## 2022-12-31 ENCOUNTER — Ambulatory Visit (INDEPENDENT_AMBULATORY_CARE_PROVIDER_SITE_OTHER): Payer: Medicaid Other | Admitting: Family Medicine

## 2022-12-31 ENCOUNTER — Encounter: Payer: Self-pay | Admitting: Family Medicine

## 2022-12-31 ENCOUNTER — Telehealth: Payer: Self-pay

## 2022-12-31 ENCOUNTER — Other Ambulatory Visit (HOSPITAL_COMMUNITY)
Admission: RE | Admit: 2022-12-31 | Discharge: 2022-12-31 | Disposition: A | Discharge status: Home / Self Care | Payer: Medicaid Other | Source: Ambulatory Visit | Attending: Family Medicine | Admitting: Family Medicine

## 2022-12-31 VITALS — BP 135/80 | HR 106 | Ht 60.0 in | Wt 288.4 lb

## 2022-12-31 DIAGNOSIS — Z6841 Body Mass Index (BMI) 40.0 and over, adult: Secondary | ICD-10-CM | POA: Diagnosis not present

## 2022-12-31 DIAGNOSIS — F17200 Nicotine dependence, unspecified, uncomplicated: Secondary | ICD-10-CM | POA: Diagnosis not present

## 2022-12-31 DIAGNOSIS — G4733 Obstructive sleep apnea (adult) (pediatric): Secondary | ICD-10-CM | POA: Diagnosis not present

## 2022-12-31 DIAGNOSIS — N898 Other specified noninflammatory disorders of vagina: Secondary | ICD-10-CM

## 2022-12-31 DIAGNOSIS — R000 Tachycardia, unspecified: Secondary | ICD-10-CM

## 2022-12-31 DIAGNOSIS — Z0000 Encounter for general adult medical examination without abnormal findings: Secondary | ICD-10-CM

## 2022-12-31 DIAGNOSIS — F3289 Other specified depressive episodes: Secondary | ICD-10-CM

## 2022-12-31 DIAGNOSIS — I10 Essential (primary) hypertension: Secondary | ICD-10-CM

## 2022-12-31 DIAGNOSIS — Z13220 Encounter for screening for lipoid disorders: Secondary | ICD-10-CM | POA: Diagnosis not present

## 2022-12-31 DIAGNOSIS — Z113 Encounter for screening for infections with a predominantly sexual mode of transmission: Secondary | ICD-10-CM

## 2022-12-31 LAB — POCT WET PREP (WET MOUNT)
Clue Cells Wet Prep Whiff POC: NEGATIVE
Trichomonas Wet Prep HPF POC: ABSENT

## 2022-12-31 MED ORDER — VENLAFAXINE HCL 75 MG PO TABS: 75.0000 mg | ORAL_TABLET | Freq: Two times a day (BID) | ORAL | 0 refills | Status: DC

## 2022-12-31 NOTE — Assessment & Plan Note (Addendum)
Has tried Zoloft and Prozac in the past, did not like either of these.  She continues to have have depression, PHQ-9 score is elevated at 20 today. No SI/HI.  -Resources for therapy provided in AVS  -Start Effexor 37.5 mg daily, increase to 75 mg daily if tolerating well -F/u in 2 weeks

## 2022-12-31 NOTE — Assessment & Plan Note (Addendum)
Had planned for A1c check today but unfortunately this was not collected. Will attempt at f/u. She is s/p gastric sleeve. She has concern for OSA given fatigue, nocturnal awakenings, snoring. Could also have component of OHS. Would benefit from sleep study. -Check lipids -Encouraged lifestyle modifications  -Consider nutrition referral at f/u if patient interested -Split night sleep study ordered

## 2022-12-31 NOTE — Assessment & Plan Note (Addendum)
Mildly tachycardic with HR 106 today. She does endorse hx of anemia and has heavy menses. She has been prescribed metoprolol for her tachycardia (and BP) in the past.  -Check CBC, ferritin, TSH -F/u at next visit

## 2022-12-31 NOTE — Telephone Encounter (Signed)
Received phone call from [place] regarding Venlafaxine prescription clarification. Spoke with Dr. Nita Sells who advised that directions were supposed to be 1/2 tablet by mouth daily for one week and then increase to 1 tablet daily.   Provided with verbal clarification.   Talbot Grumbling, RN

## 2022-12-31 NOTE — Assessment & Plan Note (Signed)
Discussed smoking cessation, she seems pre-contemplative. Provided with resources for different cessation options in AVS including tobacco cessation hotline.

## 2022-12-31 NOTE — Assessment & Plan Note (Signed)
Near goal of <130/80 without medications.  -Monitor at f/u, consider starting ARB if persistently elevated

## 2023-01-01 LAB — CERVICOVAGINAL ANCILLARY ONLY
Chlamydia: NEGATIVE
Comment: NEGATIVE
Comment: NORMAL
Neisseria Gonorrhea: NEGATIVE

## 2023-01-01 LAB — CBC

## 2023-01-02 ENCOUNTER — Other Ambulatory Visit: Payer: Self-pay

## 2023-01-02 ENCOUNTER — Ambulatory Visit (INDEPENDENT_AMBULATORY_CARE_PROVIDER_SITE_OTHER): Payer: Medicaid Other | Admitting: Family Medicine

## 2023-01-02 ENCOUNTER — Encounter: Payer: Self-pay | Admitting: Family Medicine

## 2023-01-02 LAB — LIPID PANEL
Chol/HDL Ratio: 3.2 ratio (ref 0.0–4.4)
Cholesterol, Total: 178 mg/dL (ref 100–199)
HDL: 56 mg/dL (ref >39)
LDL Chol Calc (NIH): 106 mg/dL — ABNORMAL HIGH (ref 0–99)
Triglycerides: 87 mg/dL (ref 0–149)
VLDL Cholesterol Cal: 16 mg/dL (ref 5–40)

## 2023-01-02 LAB — HIV ANTIBODY (ROUTINE TESTING W REFLEX): HIV Screen 4th Generation wRfx: NONREACTIVE

## 2023-01-02 LAB — TSH RFX ON ABNORMAL TO FREE T4: TSH: 3.07 u[IU]/mL (ref 0.450–4.500)

## 2023-01-02 LAB — HEPATITIS B SURFACE ANTIGEN: Hepatitis B Surface Ag: NEGATIVE

## 2023-01-02 LAB — RPR: RPR Ser Ql: NONREACTIVE

## 2023-01-02 LAB — CBC

## 2023-01-02 NOTE — Patient Instructions (Signed)
It was wonderful to see you today.  Please bring ALL of your medications with you to every visit.   Today we talked about:  **  Thank you for coming to your visit as scheduled. We have had a large "no-show" problem lately, and this significantly limits our ability to see and care for patients. As a friendly reminder- if you cannot make your appointment please call to cancel. We do have a no show policy for those who do not cancel within 24 hours. Our policy is that if you miss or fail to cancel an appointment within 24 hours, 3 times in a 6-month period, you may be dismissed from our clinic.   Thank you for choosing [facility].   Please call [phone] with any questions about today's appointment.  Please be sure to schedule follow up at the front  desk before you leave today.   Arelene Moroni, DO PGY-3 Family Medicine   

## 2023-01-02 NOTE — Progress Notes (Signed)
Patient presented for her appointment but unfortunately had to leave for work and was not able to be seen.   She is rescheduled for 1/18.

## 2023-01-15 ENCOUNTER — Encounter: Payer: Self-pay | Admitting: Family Medicine

## 2023-01-15 ENCOUNTER — Ambulatory Visit (INDEPENDENT_AMBULATORY_CARE_PROVIDER_SITE_OTHER): Payer: Medicaid Other | Admitting: Family Medicine

## 2023-01-15 VITALS — BP 136/74 | HR 90 | Ht 60.0 in | Wt 290.0 lb

## 2023-01-15 DIAGNOSIS — M2141 Flat foot [pes planus] (acquired), right foot: Secondary | ICD-10-CM | POA: Diagnosis not present

## 2023-01-15 DIAGNOSIS — R4589 Other symptoms and signs involving emotional state: Secondary | ICD-10-CM | POA: Insufficient documentation

## 2023-01-15 DIAGNOSIS — M2142 Flat foot [pes planus] (acquired), left foot: Secondary | ICD-10-CM | POA: Diagnosis not present

## 2023-01-15 DIAGNOSIS — Z6841 Body Mass Index (BMI) 40.0 and over, adult: Secondary | ICD-10-CM | POA: Diagnosis not present

## 2023-01-15 DIAGNOSIS — M79671 Pain in right foot: Secondary | ICD-10-CM | POA: Diagnosis not present

## 2023-01-15 LAB — POCT GLYCOSYLATED HEMOGLOBIN (HGB A1C): Hemoglobin A1C: 5.5 % (ref 4.0–5.6)

## 2023-01-15 MED ORDER — DICLOFENAC SODIUM 1 % EX GEL: 2.0000 g | Freq: Four times a day (QID) | CUTANEOUS | 1 refills | Status: DC | PRN

## 2023-01-15 NOTE — Patient Instructions (Addendum)
It was wonderful to see you today.  Please bring ALL of your medications with you to every visit.   Today we talked about:  You can take use voltaren gel as needed for your pain.  Try the stretches below.  You can also try and find "plantar fasciitis night splints" on amazon to wear at night. I would recommend sole inserts for your shoes. If the pain is not improving, we can refer you over to the sports medicine office.  I would recommend trying to get in contact with a therapist! I think this would be good for you!  Plantar Fasciitis Rehab Ask your health care provider which exercises are safe for you. Do exercises exactly as told by your health care provider and adjust them as directed. It is normal to feel mild stretching, pulling, tightness, or discomfort as you do these exercises. Stop right away if you feel sudden pain or your pain gets worse. Do not begin these exercises until told by your health care provider. Stretching and range-of-motion exercises These exercises warm up your muscles and joints and improve the movement and flexibility of your foot. These exercises also help to relieve pain. Plantar fascia stretch  Sit with your left / right leg crossed over your opposite knee. Hold your heel with one hand with that thumb near your arch. With your other hand, hold your toes and gently pull them back toward the top of your foot. You should feel a stretch on the base (bottom) of your toes, or the bottom of your foot (plantar fascia), or both. Hold this stretch for__________ seconds. Slowly release your toes and return to the starting position. Repeat __________ times. Complete this exercise __________ times a day. Gastrocnemius stretch, standing This exercise is also called a calf (gastroc) stretch. It stretches the muscles in the back of the upper calf. Stand with your hands against a wall. Extend your left / right leg behind you, and bend your front knee slightly. Keeping your  heels on the floor, your toes facing forward, and your back knee straight, shift your weight toward the wall. Do not arch your back. You should feel a gentle stretch in your upper calf. Hold this position for __________ seconds. Repeat __________ times. Complete this exercise __________ times a day. Soleus stretch, standing This exercise is also called a calf (soleus) stretch. It stretches the muscles in the back of the lower calf. Stand with your hands against a wall. Extend your left / right leg behind you, and bend your front knee slightly. Keeping your heels on the floor and your toes facing forward, bend your back knee and shift your weight slightly over your back leg. You should feel a gentle stretch deep in your lower calf. Hold this position for __________ seconds. Repeat __________ times. Complete this exercise __________ times a day. Gastroc and soleus stretch, standing step This exercise stretches the muscles in the back of the lower leg. These muscles are in the upper calf (gastrocnemius) and the lower calf (soleus). Stand with the ball of your left / right foot on the front of a step. The ball of your foot is on the walking surface, right under your toes. Keep your other foot firmly on the same step. Hold on to the wall or a railing for balance. Slowly lift your other foot, allowing your body weight to press your heel down over the edge of the front of the step. Keep knee straight and unbent. You should feel a stretch  in your calf. Hold this position for __________ seconds. Return both feet to the step. Repeat this exercise with a slight bend in your left / right knee. Repeat __________ times with your left / right knee straight and __________ times with your left / right knee bent. Complete this exercise __________ times a day. Balance exercise This exercise builds your balance and strength control of your arch to help take pressure off your plantar fascia. Single leg stand If  this exercise is too easy, you can try it with your eyes closed or while standing on a pillow. Without shoes, stand near a railing or in a doorway. You may hold on to the railing or door frame as needed. Stand on your left / right foot. Keep your big toe down on the floor and lift the arch of your foot. You should feel a stretch across the bottom of your foot and your arch. Do not let your foot roll inward. Hold this position for __________ seconds. Repeat __________ times. Complete this exercise __________ times a day. This information is not intended to replace advice given to you by your health care provider. Make sure you discuss any questions you have with your health care provider. Document Revised: 09/27/2020 Document Reviewed: 09/27/2020 Elsevier Patient Education  [place] you for coming to your visit as scheduled. We have had a large "no-show" problem lately, and this significantly limits our ability to see and care for patients. As a friendly reminder- if you cannot make your appointment please call to cancel. We do have a no show policy for those who do not cancel within 24 hours. Our policy is that if you miss or fail to cancel an appointment within 24 hours, 3 times in a 4-monthperiod, you may be dismissed from our clinic.   Thank you for choosing [facility]   Please call [phone] any questions about today's appointment.  Please be sure to schedule follow up at the front  desk before you leave today.   ASharion Settler DO PGY-3 Family Medicine

## 2023-01-15 NOTE — Assessment & Plan Note (Signed)
PHQ-9 score of 22 (20 at last visit), no SI. She did not tolerate the lexapro due to sleepiness. She subjectively feels that her mood is stable. She is still interested in therapy and will try to make an appointment today.  -Would like to continue management with therapy and no anti-depressant for now -F/u mood

## 2023-01-15 NOTE — Assessment & Plan Note (Signed)
Medial and central forefoot pain x 1 month. Has hx of plantar fasciitis. Examination was notable for pes planus and antalgic gait (would not apply pressure to medial forefoot when walking). Differential includes metatarsalgia vs plantar fasciitis. She would likely benefit from metatarsal pads and/or orthotics.  -Referral to Sports Med for further evaluation -Rx voltaren gel to use as needed (has "itching" allergy to ibuprofen and was hesitant to take any other oral NSAID) -Stretches provided in AVS

## 2023-01-15 NOTE — Progress Notes (Signed)
    SUBJECTIVE:   CHIEF COMPLAINT / HPI:   Melinda Ruiz is a 42 y.o. female who presents to the [facility] clinic today to discuss the following concerns:   Depression F/U Was last seen on 1/3, PHQ-9 score of 20. She was started on Effexor 37.5 mg at that time and given resources for therapy/counseling. She states that the medication was making her too sleepy. She has stopped her medication. She feels like her mood has been good lately.   Right Foot Pain Present for the last month. Worse when getting up in the morning. She is not taking anything for the pain. She typically wears [employer] for work. She is walking with a limp since it hurts her to apply pressure to her forefoot. No trauma or injuries to foot.    PERTINENT  PMH / PSH: HTN, GERD, depression, chiari type 1 malformation, pseudotumor cerebri   OBJECTIVE:   BP 136/74   Pulse 90   Ht 5' (1.524 m)   Wt 290 lb (131.5 kg)   LMP 12/10/2022 (Exact Date)   SpO2 99%   BMI 56.64 kg/m    General: NAD, pleasant, able to participate in exam Respiratory: normal effort Right Foot: Dry and calloused heels, dry feet. Onychomycosis of first toenail. Ttp to central forefoot. Pes planus. Gait is ataxic- does not apply pressure to medial forefoot. Doe have  Psych: Normal affect and mood     01/15/2023   11:04 AM 12/31/2022    2:10 PM 04/09/2022    2:53 PM  Depression screen PHQ 2/9  Decreased Interest 3 3 0  Down, Depressed, Hopeless 3 3 0  PHQ - 2 Score 6 6 0  Altered sleeping '3 3 1  '$ Tired, decreased energy '3 3 1  '$ Change in appetite 3 1 0  Feeling bad or failure about yourself  3 3 0  Trouble concentrating 2 2 0  Moving slowly or fidgety/restless '2 2 1  '$ Suicidal thoughts 0 0 0  PHQ-9 Score '22 20 3  '$ Difficult doing work/chores  Very difficult      ASSESSMENT/PLAN:   Right foot pain Medial and central forefoot pain x 1 month. Has hx of plantar fasciitis. Examination was notable for pes planus and antalgic gait (would not apply  pressure to medial forefoot when walking). Differential includes metatarsalgia vs plantar fasciitis. She would likely benefit from metatarsal pads and/or orthotics.  -Referral to Sports Med for further evaluation -Rx voltaren gel to use as needed (has "itching" allergy to ibuprofen and was hesitant to take any other oral NSAID) -Stretches provided in AVS   Depressed mood PHQ-9 score of 22 (20 at last visit), no SI. She did not tolerate the lexapro due to sleepiness. She subjectively feels that her mood is stable. She is still interested in therapy and will try to make an appointment today.  -Would like to continue management with therapy and no anti-depressant for now -F/u mood   BMI 50.0-59.9, adult (Central) With slightly elevated cholesterol and undergoing evaluation for sleep apnea. Hgb A1c today was normal though she does have hx of pre-diabetes in the past. She has hx gastric sleeve. She would like to work on weight loss. She is having significant joint pains in knees, shoulders.  -Referral to Medical Weight Management clinic      Sharion Settler, [place]

## 2023-01-15 NOTE — Assessment & Plan Note (Signed)
With slightly elevated cholesterol and undergoing evaluation for sleep apnea. Hgb A1c today was normal though she does have hx of pre-diabetes in the past. She has hx gastric sleeve. She would like to work on weight loss. She is having significant joint pains in knees, shoulders.  -Referral to Medical Weight Management clinic

## 2023-01-17 ENCOUNTER — Emergency Department (HOSPITAL_COMMUNITY)
Admission: EM | Admit: 2023-01-17 | Discharge: 2023-01-17 | Disposition: A | Discharge status: Home / Self Care | Payer: Medicaid Other | Attending: Emergency Medicine | Admitting: Emergency Medicine

## 2023-01-17 ENCOUNTER — Other Ambulatory Visit: Payer: Self-pay

## 2023-01-17 ENCOUNTER — Emergency Department (HOSPITAL_COMMUNITY): Payer: Medicaid Other

## 2023-01-17 ENCOUNTER — Encounter (HOSPITAL_COMMUNITY): Payer: Self-pay | Admitting: Emergency Medicine

## 2023-01-17 DIAGNOSIS — F17200 Nicotine dependence, unspecified, uncomplicated: Secondary | ICD-10-CM | POA: Diagnosis not present

## 2023-01-17 DIAGNOSIS — I10 Essential (primary) hypertension: Secondary | ICD-10-CM | POA: Diagnosis not present

## 2023-01-17 DIAGNOSIS — R079 Chest pain, unspecified: Secondary | ICD-10-CM | POA: Insufficient documentation

## 2023-01-17 DIAGNOSIS — R0602 Shortness of breath: Secondary | ICD-10-CM | POA: Diagnosis present

## 2023-01-17 LAB — CBC WITH DIFFERENTIAL/PLATELET
Abs Immature Granulocytes: 0.02 10*3/uL (ref 0.00–0.07)
Basophils Absolute: 0 10*3/uL (ref 0.0–0.1)
Basophils Relative: 0 %
Eosinophils Absolute: 0.2 10*3/uL (ref 0.0–0.5)
Eosinophils Relative: 2 %
HCT: 33.6 % — ABNORMAL LOW (ref 36.0–46.0)
Hemoglobin: 10.7 g/dL — ABNORMAL LOW (ref 12.0–15.0)
Immature Granulocytes: 0 %
Lymphocytes Relative: 37 %
Lymphs Abs: 3.8 10*3/uL (ref 0.7–4.0)
MCH: 25.2 pg — ABNORMAL LOW (ref 26.0–34.0)
MCHC: 31.8 g/dL (ref 30.0–36.0)
MCV: 79.1 fL — ABNORMAL LOW (ref 80.0–100.0)
Monocytes Absolute: 0.8 10*3/uL (ref 0.1–1.0)
Monocytes Relative: 8 %
Neutro Abs: 5.3 10*3/uL (ref 1.7–7.7)
Neutrophils Relative %: 53 %
Platelets: 457 10*3/uL — ABNORMAL HIGH (ref 150–400)
RBC: 4.25 MIL/uL (ref 3.87–5.11)
RDW: 18.2 % — ABNORMAL HIGH (ref 11.5–15.5)
WBC: 10.2 10*3/uL (ref 4.0–10.5)
nRBC: 0 % (ref 0.0–0.2)

## 2023-01-17 LAB — TROPONIN I (HIGH SENSITIVITY)
Troponin I (High Sensitivity): 3 ng/L (ref <18)
Troponin I (High Sensitivity): 2 ng/L (ref <18)

## 2023-01-17 LAB — BASIC METABOLIC PANEL
Anion gap: 6 (ref 5–15)
BUN: 10 mg/dL (ref 6–20)
CO2: 26 mmol/L (ref 22–32)
Calcium: 9.2 mg/dL (ref 8.9–10.3)
Chloride: 106 mmol/L (ref 98–111)
Creatinine, Ser: 0.9 mg/dL (ref 0.44–1.00)
GFR, Estimated: >60 mL/min (ref >60)
Glucose, Bld: 79 mg/dL (ref 70–99)
Potassium: 4.2 mmol/L (ref 3.5–5.1)
Sodium: 138 mmol/L (ref 135–145)

## 2023-01-17 LAB — I-STAT BETA HCG BLOOD, ED (MC, WL, AP ONLY): I-stat hCG, quantitative: <5 m[IU]/mL (ref <5)

## 2023-01-17 NOTE — ED Provider Notes (Signed)
[facility] Provider Note   CSN: [id number] Arrival date & time: 01/17/23  1123     History  Chief Complaint  Patient presents with   Shortness of Breath    Melinda Ruiz is a 42 y.o. female.  42 year old female presents with complaint of chest discomfort and concern for sleep apnea.  Patient states her boyfriend sent her a video this morning of her sleeping last night, states that she stops breathing in the middle of the night, is scheduled for a sleep apnea study on Tuesday.  States that she has intermittent chest discomfort described as heaviness in her chest, improves if she rubs on her chest.  States that she has been having this type discomfort for several years and these episodes are no different than prior episodes.  Patient does smoke, no history of asthma or COPD.  Past medical history includes pression, hypertension, bipolar disorder, schizophrenia, Chiari malformation, migraines.       Home Medications Prior to Admission medications   Medication Sig Start Date End Date Taking? Authorizing Provider  acetaminophen (TYLENOL) 500 MG tablet Take 500 mg by mouth every 6 (six) hours as needed.    [provider]  diclofenac Sodium (VOLTAREN) 1 % GEL Apply 2 g topically 4 (four) times daily as needed (pain). 01/15/23   Sharion Settler, DO  EPINEPHrine 0.3 mg/0.3 mL IJ SOAJ injection Inject 0.3 mg into the muscle as needed for anaphylaxis (difficulty breathing, wheezing, tongue swelling, difficulty swallowing or facial swelling). 04/25/21   Enrique Sack, FNP  pantoprazole (PROTONIX) 40 MG tablet Take 1 tablet (40 mg total) by mouth daily. 11/26/21   Alen Bleacher, MD  sucralfate (CARAFATE) 1 g tablet TAKE 1 TABLET BY MOUTH EVERY 6 HOURS AS NEEDED FOR ULCERS. Can crush and mix with water or applesauce if needed. 01/06/22   Sharion Settler, DO  SUMAtriptan (IMITREX) 100 MG tablet Take 1 tablet (100 mg total) by mouth  once as needed for up to 1 dose for migraine. May repeat in 2 hours if headache persists or recurs. 11/26/21   Alen Bleacher, MD  terbinafine (LAMISIL) 1 % cream Apply 1 application topically 2 (two) times daily. 11/26/21   Alen Bleacher, MD  topiramate (TOPAMAX) 100 MG tablet Take 1 tablet (100 mg total) by mouth 2 (two) times daily. 11/26/21   Alen Bleacher, MD  venlafaxine (EFFEXOR) 75 MG tablet Take 1 tablet (75 mg total) by mouth 2 (two) times daily. Take 1/2 tablet (37.5 mg) for 1 week. Then increase to 1 tablet (75 mg) daily. Patient not taking: Reported on 01/15/2023 12/31/22 01/30/23  Sharion Settler, DO      Allergies    Bactrim [sulfamethoxazole-trimethoprim], Ibuprofen, Lactose intolerance (gi), Penicillins, and Oxycodone-acetaminophen    Review of Systems   Review of Systems Negative except as per HPI Physical Exam Updated Vital Signs BP 114/72 (BP Location: Right Arm)   Pulse 85   Temp 98 F (36.7 C) (Oral)   Resp (!) 24   LMP 12/10/2022 (Exact Date)   SpO2 100%  Physical Exam Vitals and nursing note reviewed.  Constitutional:      General: She is not in acute distress.    Appearance: She is well-developed. She is obese. She is not diaphoretic.  HENT:     Head: Normocephalic and atraumatic.  Cardiovascular:     Rate and Rhythm: Normal rate and regular rhythm.  Pulmonary:     Effort: Pulmonary effort is normal.  Breath sounds: Normal breath sounds. No decreased breath sounds.  Chest:     Chest wall: No tenderness.  Abdominal:     Palpations: Abdomen is soft.     Tenderness: There is no abdominal tenderness.  Musculoskeletal:     Right lower leg: No tenderness. No edema.     Left lower leg: No tenderness. No edema.  Skin:    General: Skin is warm and dry.     Findings: No erythema or rash.  Neurological:     Mental Status: She is alert and oriented to person, place, and time.  Psychiatric:        Behavior: Behavior normal.     ED Results / Procedures  / Treatments   Labs (all labs ordered are listed, but only abnormal results are displayed) Labs Reviewed  CBC WITH DIFFERENTIAL/PLATELET - Abnormal; Notable for the following components:      Result Value   Hemoglobin 10.7 (*)    HCT 33.6 (*)    MCV 79.1 (*)    MCH 25.2 (*)    RDW 18.2 (*)    Platelets 457 (*)    All other components within normal limits  BASIC METABOLIC PANEL  I-STAT BETA HCG BLOOD, ED ([facility], [facility], [facility] ONLY)  TROPONIN I (HIGH SENSITIVITY)  TROPONIN I (HIGH SENSITIVITY)    EKG EKG Interpretation  Date/Time:  Saturday January 17 2023 11:50:46 EST Ventricular Rate:  90 PR Interval:  162 QRS Duration: 66 QT Interval:  334 QTC Calculation: 408 R Axis:   46 Text Interpretation: Normal sinus rhythm Normal ECG When compared with ECG of 02-Oct-2017 17:43, PREVIOUS ECG IS PRESENT Confirmed by Octaviano Glow [phone]) on 01/17/2023 2:13:10 PM  Radiology DG Chest 2 View  Result Date: 01/17/2023 CLINICAL DATA:  Shortness of breath. EXAM: CHEST - 2 VIEW COMPARISON:  10/02/2017 FINDINGS: The heart size and mediastinal contours are within normal limits. Both lungs are clear. The visualized skeletal structures are unremarkable. IMPRESSION: No active cardiopulmonary disease. Electronically Signed   By: Misty Stanley M.D.   On: 01/17/2023 12:16    Procedures Procedures    Medications Ordered in ED Medications - No data to display  ED Course/ Medical Decision Making/ A&P Clinical Course as of 01/17/23 1447  Sat Jan 17, 2023  1241 BUN: 10 [HS]    Clinical Course User Index [HS] Sherrill Raring, PA-C                             Medical Decision Making  This patient presents to the ED for concern of chest discomfort and sleep apnea, this involves an extensive number of treatment options, and is a complaint that carries with it a high risk of complications and morbidity.  The differential diagnosis includes given duration of symptoms, less likely ACS. Possible sleep apnea based  on history with planned study to be completed in 3 days. GERD.    Co morbidities that complicate the patient evaluation  Obesity, HTN, bipolar, schizophrenia, depression, chiari 1 malformation.    Additional history obtained:  External records from outside source obtained and reviewed including recent visit to the medicine dated 01/15/2023 for depression and right foot pain   Lab Tests:  I Ordered, and personally interpreted labs.  The pertinent results include:   CBC with hemoglobin 10.7 compared to 12.12 years ago.  BMP without significant findings.  Troponin is 3, delta pending.  hCG negative.  Second troponin pending at time  of shift change.   Imaging Studies ordered:  I ordered imaging studies including chest x-ray I independently visualized and interpreted imaging which showed no active disease I agree with the radiologist interpretation   Cardiac Monitoring: / EKG:  The patient was maintained on a cardiac monitor.  I personally viewed and interpreted the cardiac monitored which showed an underlying rhythm of: Normal sinus rhythm, rate 90  Problem List / ED Course / Critical interventions / Medication management  42 year old female with concern for pause in breathing while sleeping, currently undergoing workup for sleep apnea.  Also chest discomfort ongoing for years and without acute change in symptoms.  Labs reassuring, EKG without ischemic changes, chest x-ray unremarkable.  Plan is for patient to follow-up with her sleep study in 3 days and follow-up with PCP. I have reviewed the patients home medicines and have made adjustments as needed   Social Determinants of Health:  Has PCP care, scheduled for follow-up study in 3 days.   Test / Admission - Considered:  Likely discharge home to follow-up with PCP and scheduled sleep study.  Final disposition pending at change of shift pending second troponin.         Final Clinical Impression(s) / ED Diagnoses Final  diagnoses:  Chest pain, unspecified type    Rx / DC Orders ED Discharge Orders     None         Tacy Learn, PA-C 01/17/23 1447    Wyvonnia Dusky, MD 01/17/23 1531

## 2023-01-17 NOTE — Discharge Instructions (Addendum)
Thankfully, your second heart lab is normal! Follow-up with your primary care provider for recheck.  Follow-up for your sleep study as scheduled.

## 2023-01-17 NOTE — ED Provider Triage Note (Signed)
Emergency Medicine Provider Triage Evaluation Note  Melinda Ruiz , a 42 y.o. female  was evaluated in triage.  Pt complains of shortness of breath for "a while".  Boyfriend found her sleeping last night and she stops breathing in the middle the night, no history of sleep apnea has a sleep study scheduled for Tuesday.  Intermittent chest pain, denies any lower extremity swelling.  Smokes cigarettes, no history of asthma or COPD.  Denies viral symptoms..  Review of Systems  Per HPI  Physical Exam  LMP 12/10/2022 (Exact Date)  Gen:   Awake, no distress   Resp:  Normal effort  MSK:   Moves extremities without difficulty  Other:    Medical Decision Making  Medically screening exam initiated at 11:36 AM.  Appropriate orders placed.  RON JUNCO was informed that the remainder of the evaluation will be completed by another provider, this initial triage assessment does not replace that evaluation, and the importance of remaining in the ED until their evaluation is complete.     Sherrill Raring, PA-C 01/17/23 1137

## 2023-01-20 ENCOUNTER — Ambulatory Visit (HOSPITAL_BASED_OUTPATIENT_CLINIC_OR_DEPARTMENT_OTHER): Payer: Medicaid Other | Attending: Family Medicine | Admitting: Internal Medicine

## 2023-01-20 VITALS — Ht 60.0 in | Wt 290.0 lb

## 2023-01-20 DIAGNOSIS — G4733 Obstructive sleep apnea (adult) (pediatric): Secondary | ICD-10-CM | POA: Diagnosis not present

## 2023-01-20 DIAGNOSIS — Z6841 Body Mass Index (BMI) 40.0 and over, adult: Secondary | ICD-10-CM | POA: Diagnosis not present

## 2023-01-22 ENCOUNTER — Ambulatory Visit: Payer: Medicaid Other | Admitting: Sports Medicine

## 2023-01-25 DIAGNOSIS — Z6841 Body Mass Index (BMI) 40.0 and over, adult: Secondary | ICD-10-CM | POA: Diagnosis not present

## 2023-01-25 DIAGNOSIS — G4733 Obstructive sleep apnea (adult) (pediatric): Secondary | ICD-10-CM | POA: Diagnosis not present

## 2023-01-25 NOTE — Procedures (Signed)
Patient Name: Melinda Ruiz, Melinda Ruiz Date: 01/20/2023 Gender: Female D.O.B: 07-07-1981 Age (years): 23 Referring Provider: Leeanne Rio Height (inches): 86 Interpreting Physician: [name] MD, ABSM Weight (lbs): 290 RPSGT: Carolin Coy BMI: 31 MRN: [id number] Neck Size: 16.50  CLINICAL INFORMATION Sleep Study Type: Split Night CPAP Indication for sleep study: Diabetes, Excessive Daytime Sleepiness, Hypertension, Obesity, OSA, Sleep walking/talking/parasomnias, Snoring, Witnessed Apneas Epworth Sleepiness Score: 9  SLEEP STUDY TECHNIQUE As per the AASM Manual for the Scoring of Sleep and Associated Events v2.3 (April 2016) with a hypopnea requiring 4% desaturations.  The channels recorded and monitored were frontal, central and occipital EEG, electrooculogram (EOG), submentalis EMG (chin), nasal and oral airflow, thoracic and abdominal wall motion, anterior tibialis EMG, snore microphone, electrocardiogram, and pulse oximetry. Continuous positive airway pressure (CPAP) was initiated when the patient met split night criteria and was titrated according to treat sleep-disordered breathing.  MEDICATIONS Medications self-administered by patient taken the night of the study : none reporte  RESPIRATORY PARAMETERS Diagnostic  Total AHI (/hr): 13.7 RDI (/hr): 37.9 OA Index (/hr): - CA Index (/hr): 0.0 REM AHI (/hr): 50.0 NREM AHI (/hr): 7.9 Supine AHI (/hr): 13.7 Non-supine AHI (/hr): 0 Min O2 Sat (%): 84.00 Mean O2 (%): 96.51 Time below 88% (min): 0.9   Titration  Optimal Pressure (cm): 17 AHI at Optimal Pressure (/hr): 3.3 Min O2 at Optimal Pressure (%): 93.0 Supine % at Optimal (%): 0 Sleep % at Optimal (%): 98   SLEEP ARCHITECTURE The recording time for the entire night was 407.7 minutes.  During a baseline period of 220.3 minutes, the patient slept for 175.6 minutes in REM and nonREM, yielding a sleep efficiency of 79.7%. Sleep onset after lights out was 39.7  minutes with a REM latency of 63.5 minutes. The patient spent 11.39% of the night in stage N1 sleep, 74.94% in stage N2 sleep, 0.00% in stage N3 and 13.7% in REM.  During the titration period of 182.1 minutes, the patient slept for 162.5 minutes in REM and nonREM, yielding a sleep efficiency of 89.3%. Sleep onset after CPAP initiation was 14.6 minutes with a REM latency of 60.5 minutes. The patient spent 2.77% of the night in stage N1 sleep, 72.00% in stage N2 sleep, 0.00% in stage N3 and 25.2% in REM.  CARDIAC DATA The 2 lead EKG demonstrated sinus rhythm. The mean heart rate was 87.58 beats per minute. Other EKG findings include: None.  LEG MOVEMENT DATA The total Periodic Limb Movements of Sleep (PLMS) were 0. The PLMS index was 0.00   IMPRESSIONS - Mild obstructive sleep apnea occurred during the diagnostic portion of the study (AHI = 13.7 /hour). An optimal PAP pressure was selected for this patient (17 cm of water) - No significant central sleep apnea occurred during the diagnostic portion of the study (CAI = 0.0/hour). - The patient had mild oxygen desaturation during the diagnostic portion of the study (Min O2 = 84.00%).  With CPAP 17, minimum O2 saturation was 93%. - The patient snored with moderate snoring volume during the diagnostic portion of the study. - No cardiac abnormalities were noted during this study. - Clinically significant periodic limb movements did not occur during sleep.  DIAGNOSIS - Obstructive Sleep Apnea (G47.33)  RECOMMENDATIONS - Trial of CPAP therapy on 17 cm H2O or autopap 10-20. - Patient used an Extra Small size [employer] Full Face Evora Full mask and heated humidification. - Be careful with alcohol, sedatives and other CNS depressants that may worsen sleep  apnea and disrupt normal sleep architecture. - Sleep hygiene should be reviewed to assess factors that may improve sleep quality. - Weight management and regular exercise should be initiated or  continued.  [Electronically signed] 01/25/2023 12:13 PM  [name] MD, [place], American Board of Sleep Medicine NPI: [id number]                         [place], [place] [facility]  ELECTRONICALLY SIGNED ON:  01/25/2023, 12:07 PM [place] PH: [phone]   FX: [phone] [place]

## 2023-01-27 ENCOUNTER — Other Ambulatory Visit: Payer: Self-pay | Admitting: Family Medicine

## 2023-01-27 DIAGNOSIS — G4733 Obstructive sleep apnea (adult) (pediatric): Secondary | ICD-10-CM

## 2023-01-27 NOTE — Progress Notes (Signed)
DME order for CPAP given sleep study results showing OSA.

## 2023-02-04 ENCOUNTER — Telehealth: Payer: Self-pay

## 2023-02-04 NOTE — Telephone Encounter (Signed)
Community message sent to [employer] for CPAP machine. Will await response.   Talbot Grumbling, RN

## 2023-02-05 NOTE — Telephone Encounter (Signed)
Receipt confirmed by [employer].   Jonnette Nuon C Kynley Metzger, RN  

## 2023-02-26 ENCOUNTER — Ambulatory Visit: Payer: Medicaid Other | Admitting: Obstetrics and Gynecology

## 2023-03-10 ENCOUNTER — Encounter: Payer: Medicaid Other | Admitting: Bariatrics

## 2023-03-22 NOTE — Progress Notes (Signed)
    SUBJECTIVE:   CHIEF COMPLAINT / HPI:   Melinda Ruiz is a 42 y.o. female who presents to the [facility] clinic today to discuss the following concerns:   F/u OSA on CPAP Patient underwent split night sleep study on 1/23 which confirmed dx of OSA. She was started on CPAP on 2/23.  She is compliant with it, uses it nightly. Sometimes will unintentionally pull mask off of face in her sleep. She feels more well rested since using, feels that she is sleeping better. She would like a bigger mask because the one she is using now is a little tight on her nose.    PERTINENT  PMH / PSH: GERD, HTN, class III obesity, tobacco use disorder  OBJECTIVE:   BP 122/74   Pulse 90   Ht 5' (1.524 m)   Wt 291 lb 9.6 oz (132.3 kg)   LMP 03/11/2023   SpO2 99%   BMI 56.95 kg/m    General: NAD, pleasant, able to participate in exam Respiratory: normal effort Abdomen: Obese abdomen Psych: Normal affect and mood     03/23/2023    2:22 PM 01/15/2023   11:04 AM 12/31/2022    2:10 PM  Depression screen PHQ 2/9  Decreased Interest 1 3 3   Down, Depressed, Hopeless 0 3 3  PHQ - 2 Score 1 6 6   Altered sleeping 0 3 3  Tired, decreased energy 1 3 3   Change in appetite 1 3 1   Feeling bad or failure about yourself  0 3 3  Trouble concentrating 0 2 2  Moving slowly or fidgety/restless 0 2 2  Suicidal thoughts 0 0 0  PHQ-9 Score 3 22 20   Difficult doing work/chores Not difficult at all  Very difficult     ASSESSMENT/PLAN:   1. OSA on CPAP Patient was able to show me report that shows she has used her CPAP for greater than 4 hours 29 in the last 30 days.  She has had significant improvement with CPAP.  Her only complaint is that she would like a larger mask as the one that she has currently presses up against her nose too tightly.  Since starting CPAP therapy she feels well rested, having fewer headaches, mood is better.   Sharion Settler, [place]

## 2023-03-23 ENCOUNTER — Ambulatory Visit: Payer: Medicaid Other | Admitting: Family Medicine

## 2023-03-23 ENCOUNTER — Encounter: Payer: Self-pay | Admitting: Family Medicine

## 2023-03-23 ENCOUNTER — Ambulatory Visit (INDEPENDENT_AMBULATORY_CARE_PROVIDER_SITE_OTHER): Payer: Medicaid Other | Admitting: Family Medicine

## 2023-03-23 VITALS — BP 122/74 | HR 90 | Ht 60.0 in | Wt 291.6 lb

## 2023-03-23 DIAGNOSIS — G4733 Obstructive sleep apnea (adult) (pediatric): Secondary | ICD-10-CM | POA: Diagnosis present

## 2023-03-23 MED ORDER — SUMATRIPTAN SUCCINATE 100 MG PO TABS: 100.0000 mg | ORAL_TABLET | Freq: Once | ORAL | 6 refills | Status: DC | PRN

## 2023-03-23 MED ORDER — TOPIRAMATE 100 MG PO TABS: 100.0000 mg | ORAL_TABLET | Freq: Two times a day (BID) | ORAL | 11 refills | Status: DC

## 2023-03-23 MED ORDER — PANTOPRAZOLE SODIUM 40 MG PO TBEC: 40.0000 mg | DELAYED_RELEASE_TABLET | Freq: Every day | ORAL | 1 refills | Status: DC

## 2023-03-23 MED ORDER — SUCRALFATE 1 G PO TABS: 1.0000 g | ORAL_TABLET | Freq: Three times a day (TID) | ORAL | 1 refills | Status: AC

## 2023-03-23 NOTE — Patient Instructions (Addendum)
It was wonderful to see you today.  Please bring ALL of your medications with you to every visit.   Today we talked about:  I am so glad that you are using your CPAP! I can tell that it is making a difference.  I have refilled your medications.   I would recommend that you reach out to [employer] to see if you can get a bigger face mask.   Thank you for coming to your visit as scheduled. We have had a large "no-show" problem lately, and this significantly limits our ability to see and care for patients. As a friendly reminder- if you cannot make your appointment please call to cancel. We do have a no show policy for those who do not cancel within 24 hours. Our policy is that if you miss or fail to cancel an appointment within 24 hours, 3 times in a 71-month period, you may be dismissed from our clinic.   Thank you for choosing [facility].   Please call [phone] with any questions about today's appointment.  Please be sure to schedule follow up at the front  desk before you leave today.   Sharion Settler, DO PGY-3 Family Medicine

## 2023-06-16 ENCOUNTER — Ambulatory Visit (INDEPENDENT_AMBULATORY_CARE_PROVIDER_SITE_OTHER): Payer: Medicaid Other | Admitting: Obstetrics and Gynecology

## 2023-06-16 ENCOUNTER — Encounter: Payer: Self-pay | Admitting: Obstetrics and Gynecology

## 2023-06-16 ENCOUNTER — Other Ambulatory Visit: Payer: Self-pay

## 2023-06-16 VITALS — BP 130/87 | HR 118 | Ht 61.0 in | Wt 294.7 lb

## 2023-06-16 DIAGNOSIS — N920 Excessive and frequent menstruation with regular cycle: Secondary | ICD-10-CM

## 2023-06-16 DIAGNOSIS — F17200 Nicotine dependence, unspecified, uncomplicated: Secondary | ICD-10-CM | POA: Diagnosis not present

## 2023-06-16 NOTE — Progress Notes (Signed)
GYNECOLOGY VISIT  Patient name: Melinda Ruiz MRN [id number]  Date of birth: 12/08/1981 Chief Complaint:   Menorrhagia  History:  Melinda Ruiz is a 42 y.o. [id number] being seen today for discussion of hysterectomy.  Bleeding has been about the same - some menses are heavier than it is and others are not as bad. Wearing 2 pads when she is having heavy bleeding. Pain with periods as well.   Restarted cigarettes  Wants to discuss a hysterectomy.  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; if riding may be 30 mintues   Past Medical History:  Diagnosis Date   Acid reflux    Anemia, during pregnancy    Bipolar disorder (HCC)    Chiari I malformation (HCC)    Complication of anesthesia    "Oxygen level drops"   Depression    Hypertension    Kidney stones 2010   Migraine    Osteoarthritis    Prediabetes 09/26/2021   Schizophrenia (HCC)    Sleep apnea    neg 09/2019 sleep study   Trichomoniasis    Ulcer of abdomen wall (HCC)     Past Surgical History:  Procedure Laterality Date   BREATH TEK H PYLORI N/A 05/31/2015   Procedure: BREATH TEK H PYLORI;  Surgeon: Gaynelle Adu, MD;  Location: [facility];  Service: General;  Laterality: N/A;   CARPAL TUNNEL RELEASE  02/03/2012   Procedure: CARPAL TUNNEL RELEASE;  Surgeon: Cammy Copa, MD;  Location: [facility] OR;  Service: Orthopedics;   Laterality: Left;  left carpal tunnel release   CARPAL TUNNEL RELEASE  01/11/2013   Procedure: CARPAL TUNNEL RELEASE;  Surgeon: Cammy Copa, MD;  Location: [facility] OR;  Service: Orthopedics;  Laterality: Right;  Hand Set, Small Retractors, Lead Hand   CESAREAN SECTION  2006, 2011   CHOLECYSTECTOMY  06/2011   cystectomy     GANGLION CYST EXCISION  02/03/2012   Procedure: REMOVAL GANGLION OF WRIST;  Surgeon: Cammy Copa, MD;  Location: [facility] OR;  Service: Orthopedics;  Laterality: Left;  left dorsal ganglion cyst excision   LAPAROSCOPIC GASTRIC SLEEVE RESECTION N/A 10/02/2015   Procedure: LAPAROSCOPIC GASTRIC SLEEVE RESECTION;  Surgeon: Gaynelle Adu, MD;  Location: [facility];  Service: General;  Laterality: N/A;   Lymp Glands Removed--under arm bil     Lympectomy     MULTIPLE TOOTH EXTRACTIONS  2003   and wisdom teeth   Pilonidial Cyst Removed  1990's   TUBAL LIGATION  2011   UPPER GI ENDOSCOPY  10/02/2015   Procedure: UPPER GI ENDOSCOPY;  Surgeon: Gaynelle Adu, MD;  Location: [facility];  Service: General;;    The following portions of the patient's history were reviewed and updated as appropriate: allergies, current medications, past family history, past medical history, past social history, past surgical history and problem list.   Health Maintenance:   Last pap  Component Value Date/Time   DIAGPAP  12/03/2020 1408    - Negative for intraepithelial lesion or malignancy (NILM)   DIAGPAP  04/02/2017 0000    NEGATIVE FOR INTRAEPITHELIAL LESIONS OR MALIGNANCY.   HPVHIGH Negative 12/03/2020 1408   ADEQPAP  12/03/2020 1408    Satisfactory for evaluation; transformation zone component PRESENT.   ADEQPAP  04/02/2017 0000    Satisfactory for evaluation  endocervical/transformation zone component PRESENT.    High Risk HPV: Positive  Adequacy:  Satisfactory for evaluation, transformation zone component PRESENT  Diagnosis:  Atypical squamous cells of undetermined significance (ASC-US)  Last  mammogram: n/a  Review of Systems:  Pertinent items are noted in HPI. Comprehensive review of systems was otherwise negative.   Objective:  Physical Exam BP 130/87   Pulse (!) 118   Ht 5\' 1"  (1.549 m)   Wt 294 lb 11.2 oz (133.7 kg)   BMI 55.68 kg/m    Physical Exam Vitals and nursing note reviewed.  Constitutional:      Appearance: Normal appearance.  HENT:     Head: Normocephalic and atraumatic.  Pulmonary:     Effort: Pulmonary effort is normal.  Skin:    General: Skin is warm and dry.  Neurological:     General: No focal deficit present.     Mental Status: She is alert.  Psychiatric:        Mood and Affect: Mood normal.        Behavior: Behavior normal.        Thought Content: Thought content normal.        Judgment: Judgment normal.      Labs and Imaging Narrative & Impression  CLINICAL DATA:  Initial evaluation for menorrhagia.   EXAM: TRANSABDOMINAL AND TRANSVAGINAL ULTRASOUND OF PELVIS   TECHNIQUE: Both transabdominal and transvaginal ultrasound examinations of the pelvis were performed. Transabdominal technique was performed for global imaging of the pelvis including uterus, ovaries, adnexal regions, and pelvic cul-de-sac. It was necessary to proceed with endovaginal exam following the transabdominal exam to visualize the endometrium and ovaries.   COMPARISON:  Prior ultrasound from 02/28/2014.   FINDINGS: Uterus   Measurements: 10.6 x 5.1 x 6.3 cm = volume: 178.1 mL. Uterus is anteverted. No discrete fibroid or other myometrial abnormality.   Endometrium   Thickness: 10.6 mm. No focal abnormality visualized. Trace simple fluid noted within the endocervical canal.   Right ovary   Measurements: 3.0 x 2.2 x 2.2 cm = volume: 7.6 mL. Normal appearance/no adnexal mass.   Left ovary   Measurements: 4.5 x 2.7 x 3.3 cm = volume: 20.8 mL. Normal appearance/no adnexal mass.   Other findings   No abnormal free fluid.   IMPRESSION: 1. Normal  pelvic ultrasound. 2. Endometrial stripe measures up to 10.6 mm in thickness. If bleeding remains unresponsive to hormonal or medical therapy, sonohysterogram should be considered for focal lesion work-up. (Ref: Radiological Reasoning: Algorithmic Workup of Abnormal Vaginal Bleeding with Endova       Assessment & Plan:   1. Menorrhagia with regular cycle Discussed management options for abnormal uterine bleeding including NSAIDs (Naproxen), tranexamic acid (Lysteda), oral progesterone, Depo Provera, Levonogestrel IUD, endometrial ablation or hysterectomy as definitive surgical management.  Discussed risks and benefits of each method.   Patient desire definitive management with hysterectomy. Discussed possible risks of injury given prior abdominal surgery. Discussed postoperative expectations. Discussed possible early return to work with light duty to avoid having an extended period without pay while also prioritizing postop healing.  2. Tobacco use disorder Reviewed importance of quitting for both perioperative pulmonary health but postop healing as well.    Patient desires surgical management with RA-TLH, BS, cysto.  The risks of surgery were discussed in detail with the patient including but not limited to: bleeding which may require transfusion or reoperation; infection which may require prolonged hospitalization or re-hospitalization and antibiotic therapy; injury to bowel, bladder, ureters and major vessels or other surrounding organs which may lead to other procedures; formation of adhesions; need for additional procedures including laparotomy or subsequent procedures secondary to intraoperative injury or abnormal pathology; thromboembolic phenomenon; incisional problems and other postoperative or anesthesia complications.  Patient was told that the likelihood that her condition and symptoms will be treated effectively with this surgical management was high; the postoperative  expectations were also discussed in detail. The patient also understands the alternative treatment options which were discussed in full. All questions were answered.  She was told that she will be contacted by our surgical scheduler regarding the time and date of her surgery; routine preoperative instructions will be given to her by the preoperative nursing team.    Lorriane Shire, MD [facility] for [facility], [facility]

## 2023-07-08 ENCOUNTER — Ambulatory Visit
Admission: RE | Admit: 2023-07-08 | Discharge: 2023-07-08 | Disposition: A | Discharge status: Home / Self Care | Payer: Medicaid Other | Source: Ambulatory Visit | Attending: Internal Medicine | Admitting: Internal Medicine

## 2023-07-08 VITALS — BP 125/88 | HR 98 | Temp 98.4°F | Resp 18

## 2023-07-08 DIAGNOSIS — B349 Viral infection, unspecified: Secondary | ICD-10-CM | POA: Diagnosis not present

## 2023-07-08 DIAGNOSIS — M79641 Pain in right hand: Secondary | ICD-10-CM | POA: Insufficient documentation

## 2023-07-08 DIAGNOSIS — Z1152 Encounter for screening for COVID-19: Secondary | ICD-10-CM | POA: Insufficient documentation

## 2023-07-08 MED ORDER — DEXAMETHASONE SODIUM PHOSPHATE 10 MG/ML IJ SOLN
10.0000 mg | Freq: Once | INTRAMUSCULAR | Status: AC
  Administered 2023-07-08: 10 mg via INTRAVENOUS

## 2023-07-08 NOTE — ED Provider Notes (Signed)
[facility]    CSN: [id number] Arrival date & time: 07/08/23  1823      History   Chief Complaint Chief Complaint  Patient presents with   Finger Injury    Covid Symptoms - Entered by patient   Appointment    1900    HPI Melinda Ruiz is a 42 y.o. female.   Headaches started today, exposed to her uncle who tested postive for COVID-19- exposed last week Sore throat and nasal drainage as well No cough, vision changes, dizziness, or rash Reports chills without documented fever at home No recent Covid infection in the last 90 days Former smoker (stopped 3 weeks ago)  Right hand swelling for the last 2 weeks over the last 2 weeks Unsure if she hit her hand on something Pain to the dorsom of the right hand with finger movement Having more pain to the middle 2 fingers Drives a bus for a living She is right hand dominant She reaches and pulls more with the right hand when driving than the left      Past Medical History:  Diagnosis Date   Acid reflux    Anemia, during pregnancy    Bipolar disorder (HCC)    Chiari I malformation (HCC)    Complication of anesthesia    "Oxygen level drops"   Depression    Hypertension    Kidney stones 2010   Migraine    Osteoarthritis    Prediabetes 09/26/2021   Schizophrenia (HCC)    Sleep apnea    neg 09/2019 sleep study   Trichomoniasis    Ulcer of abdomen wall [facility])     Patient Active Problem List   Diagnosis Date Noted   Right foot pain 01/15/2023   Depressed mood 01/15/2023   Tobacco use disorder 12/31/2022   History of trichomoniasis 12/24/2022   Plantar fasciitis 12/05/2020   Tinea pedis 12/05/2020   Tachycardia 12/03/2020   Severe obesity (BMI >= 40) (HCC) 10/02/2020   Blurry vision, bilateral 10/02/2020   Carpal tunnel syndrome of left wrist 09/02/2019   Depression 01/17/2019   Chronic migraine 07/30/2017   Chiari malformation type I (HCC) 07/30/2017   Thyromegaly 04/09/2016   Oropharyngeal  dysphagia 04/09/2016   Multiple thyroid nodules 04/09/2016   Gastroesophageal reflux disease 04/09/2016   Low HDL (under 40) 10/02/2015   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, considering revision bypass due to severe reflux 10/02/2015   Hypertension, benign 06/08/2013   Menorrhagia with regular cycle 06/08/2013    Past Surgical History:  Procedure Laterality Date   BREATH TEK H PYLORI N/A 05/31/2015   Procedure: BREATH TEK Richardo Priest;  Surgeon: Gaynelle Adu, MD;  Location: [facility];  Service: General;  Laterality: N/A;   CARPAL TUNNEL RELEASE  02/03/2012   Procedure: CARPAL TUNNEL RELEASE;  Surgeon: Cammy Copa, MD;  Location: [facility] OR;  Service: Orthopedics;  Laterality: Left;  left carpal tunnel release   CARPAL TUNNEL RELEASE  01/11/2013   Procedure: CARPAL TUNNEL RELEASE;  Surgeon: Cammy Copa, MD;  Location: [facility];  Service: Orthopedics;  Laterality: Right;  Hand Set, Small Retractors, Lead Hand   CESAREAN SECTION  2006, 2011   CHOLECYSTECTOMY  06/2011   cystectomy     GANGLION CYST EXCISION  02/03/2012   Procedure: REMOVAL GANGLION OF WRIST;  Surgeon: Cammy Copa, MD;  Location: [facility] OR;  Service: Orthopedics;  Laterality: Left;  left dorsal ganglion cyst excision   LAPAROSCOPIC GASTRIC SLEEVE RESECTION N/A  10/02/2015   Procedure: LAPAROSCOPIC GASTRIC SLEEVE RESECTION;  Surgeon: Gaynelle Adu, MD;  Location: [facility];  Service: General;  Laterality: N/A;   Lymp Glands Removed--under arm bil     Lympectomy     MULTIPLE TOOTH EXTRACTIONS  2003   and wisdom teeth   Pilonidial Cyst Removed  1990's   TUBAL LIGATION  2011   UPPER GI ENDOSCOPY  10/02/2015   Procedure: UPPER GI ENDOSCOPY;  Surgeon: Gaynelle Adu, MD;  Location: [facility];  Service: General;;    OB History     Gravida  3   Para  2   Term  2   Preterm  0   AB  1   Living  2      SAB  1   IAB  0   Ectopic  0   Multiple  0   Live Births  2            Home Medications     Prior to Admission medications   Medication Sig Start Date End Date Taking? Authorizing Provider  acetaminophen (TYLENOL) 500 MG tablet Take 500 mg by mouth every 6 (six) hours as needed.    [provider]  diclofenac Sodium (VOLTAREN) 1 % GEL Apply 2 g topically 4 (four) times daily as needed (pain). 01/15/23   Sabino Dick, DO  EPINEPHrine 0.3 mg/0.3 mL IJ SOAJ injection Inject 0.3 mg into the muscle as needed for anaphylaxis (difficulty breathing, wheezing, tongue swelling, difficulty swallowing or facial swelling). 04/25/21   Lurline Idol, FNP  pantoprazole (PROTONIX) 40 MG tablet Take 1 tablet (40 mg total) by mouth daily. 03/23/23   Sabino Dick, DO  sucralfate (CARAFATE) 1 g tablet Take 1 tablet (1 g total) by mouth 4 (four) times daily -  with meals and at bedtime. 03/23/23   Sabino Dick, DO  SUMAtriptan (IMITREX) 100 MG tablet Take 1 tablet (100 mg total) by mouth once as needed for up to 1 dose for migraine. May repeat in 2 hours if headache persists or recurs. 03/23/23   Sabino Dick, DO  terbinafine (LAMISIL) 1 % cream Apply 1 application topically 2 (two) times daily. 11/26/21   Jerre Simon, MD  topiramate (TOPAMAX) 100 MG tablet Take 1 tablet (100 mg total) by mouth 2 (two) times daily. 03/23/23   Sabino Dick, DO  venlafaxine (EFFEXOR) 75 MG tablet Take 1 tablet (75 mg total) by mouth 2 (two) times daily. Take 1/2 tablet (37.5 mg) for 1 week. Then increase to 1 tablet (75 mg) daily. Patient not taking: Reported on 01/15/2023 12/31/22 01/30/23  Sabino Dick, DO    Family History Family History  Problem Relation Age of Onset   Kidney cancer Father    Diabetes Mellitus II Father    Hypertension Father    Arthritis Mother    Diabetes Mellitus II Mother    Hypertension Mother    Diabetes Mother    Anxiety disorder Mother    Anesthesia problems Neg Hx     Social History Social History   Tobacco Use   Smoking status: Every  Day    Packs/day: .25    Types: Cigarettes    Last attempt to quit: 10/01/2009    Years since quitting: 13.7   Smokeless tobacco: Never  Vaping Use   Vaping Use: Never used  Substance Use Topics   Alcohol use: Yes    Comment: Occa   Drug use: No     Allergies   Bactrim [sulfamethoxazole-trimethoprim], Ibuprofen,  Lactose intolerance (gi), Penicillins, and Oxycodone-acetaminophen   Review of Systems Review of Systems   Physical Exam Triage Vital Signs ED Triage Vitals  Enc Vitals Group     BP 07/08/23 1918 125/88     Pulse Rate 07/08/23 1918 98     Resp 07/08/23 1918 18     Temp 07/08/23 1924 98.4 F (36.9 C)     Temp Source 07/08/23 1918 Oral     SpO2 07/08/23 1918 98 %     Weight --      Height --      Head Circumference --      Peak Flow --      Pain Score 07/08/23 1924 8     Pain Loc --      Pain Edu? --      Excl. in GC? --    No data found.  Updated Vital Signs BP 125/88 (BP Location: Right Arm)   Pulse 98   Temp 98.4 F (36.9 C) (Oral)   Resp 18   LMP 06/12/2023 (Exact Date)   SpO2 98%   Visual Acuity Right Eye Distance:   Left Eye Distance:   Bilateral Distance:    Right Eye Near:   Left Eye Near:    Bilateral Near:     Physical Exam   UC Treatments / Results  Labs (all labs ordered are listed, but only abnormal results are displayed) Labs Reviewed - No data to display  EKG   Radiology No results found.  Procedures Procedures (including critical care time)  Medications Ordered in UC Medications - No data to display  Initial Impression / Assessment and Plan / UC Course  I have reviewed the triage vital signs and the nursing notes.  Pertinent labs & imaging results that were available during my care of the patient were reviewed by me and considered in my medical decision making (see chart for details).     *** Final Clinical Impressions(s) / UC Diagnoses   Final diagnoses:  None   Discharge Instructions   None     ED Prescriptions   None    PDMP not reviewed this encounter.

## 2023-07-08 NOTE — Discharge Instructions (Addendum)
Your right hand pain is likely due to overuse injury. I gave you a shot of steroid in the clinic to help with your pain and swelling since you are unable to take oral steroids and ibuprofen due to GERD. Rest, ice, and elevate the hand to reduce swelling and inflammation. Continue Tylenol as needed.  Your symptoms are most likely due to a viral illness, which will improve on its own with rest and fluids. - Use over the counter medicines to help with symptoms as discussed: tylenol as needed for sore throat - Two teaspoons of honey in warm water every 4-6 hours may help with throat pains - Humidifier in your room at night to help add water the air and soothe cough  If you develop any new or worsening symptoms or do not improve in the next 2 to 3 days, please return.  If your symptoms are severe, please go to the emergency room.  Follow-up with PCP as needed.

## 2023-07-08 NOTE — ED Triage Notes (Signed)
Pt reports pain and swelling in right hand x 2 weeks. Tylenol gives no relief.   Pt reports headache x 1 week. Pt took Topamax today and felt her "someone was squeezing my head". Pt think headache can be related to COVID exposure 1 week ago.

## 2023-07-09 ENCOUNTER — Encounter: Payer: Self-pay | Admitting: Obstetrics and Gynecology

## 2023-07-10 LAB — SARS CORONAVIRUS 2 (TAT 6-24 HRS): SARS Coronavirus 2: NEGATIVE

## 2023-07-11 ENCOUNTER — Encounter (HOSPITAL_COMMUNITY): Payer: Self-pay | Admitting: *Deleted

## 2023-07-11 ENCOUNTER — Emergency Department (HOSPITAL_COMMUNITY)
Admission: EM | Admit: 2023-07-11 | Discharge: 2023-07-11 | Disposition: A | Discharge status: Home / Self Care | Payer: Medicaid Other | Attending: Emergency Medicine | Admitting: Emergency Medicine

## 2023-07-11 ENCOUNTER — Other Ambulatory Visit: Payer: Self-pay

## 2023-07-11 ENCOUNTER — Emergency Department (HOSPITAL_COMMUNITY): Payer: Medicaid Other

## 2023-07-11 DIAGNOSIS — G935 Compression of brain: Secondary | ICD-10-CM | POA: Diagnosis not present

## 2023-07-11 DIAGNOSIS — R519 Headache, unspecified: Secondary | ICD-10-CM | POA: Insufficient documentation

## 2023-07-11 DIAGNOSIS — I10 Essential (primary) hypertension: Secondary | ICD-10-CM | POA: Diagnosis not present

## 2023-07-11 MED ORDER — SODIUM CHLORIDE 0.9 % IV SOLN
500.0000 mg | Freq: Once | INTRAVENOUS | Status: DC
  Filled 2023-07-11: qty 2

## 2023-07-11 MED ORDER — BUTALBITAL-APAP-CAFFEINE 50-325-40 MG PO TABS: 1.0000 | ORAL_TABLET | Freq: Four times a day (QID) | ORAL | 0 refills | Status: AC | PRN

## 2023-07-11 NOTE — Discharge Instructions (Addendum)
You have been evaluated for your symptoms.  Your headache is likely due to Chiari malformation.  You may take Fioricet as needed for headache.  The neurosurgery office will contact you next week for close outpatient management.  If you do not hear from them, please call the number below.

## 2023-07-11 NOTE — ED Provider Notes (Signed)
[place] [facility]GENC[facility] DEPARTMENT AT [facility] Provider Note   CSN: [id number] Arrival date & time: 07/11/23  1802     History  Chief Complaint  Patient presents with   Headache    Melinda Ruiz is a 42 y.o. female.  The history is provided by the patient and medical records. No language interpreter was used.  Headache    42 year old female significant history of chronic migraine, Chiari malformation type I, hypertension, presenting with complaints of headache.  Patient report for the past 3 weeks she has had progressive worsening headache.  She described headache as a pressure sensation primarily to the left side of her head from the back of her head radiates towards her forehead with pressure behind her eyes.  She also now noticing some blurry vision and having some trouble with her speech.  She mention headache that was similar to her headache related to her Chiari malformation in the past requiring lumbar puncture to remove fluid.  The last time she had it done was several years past.  She mention she recently had an evaluation at the urgent care center for her headache and had a COVID test that came back negative.  She does not think this is related to an infection.  She also felt this is different from her usual migraine headache.  She tries taking medication including Topamax, and sumatriptan which minimal relief.  No fever chills runny nose sneezing coughing shortness of breath focal numbness or focal weakness or rash.  Home Medications Prior to Admission medications   Medication Sig Start Date End Date Taking? Authorizing Provider  acetaminophen (TYLENOL) 500 MG tablet Take 500 mg by mouth every 6 (six) hours as needed.    [provider]  diclofenac Sodium (VOLTAREN) 1 % GEL Apply 2 g topically 4 (four) times daily as needed (pain). 01/15/23   Sabino Dick, DO  EPINEPHrine 0.3 mg/0.3 mL IJ SOAJ injection Inject 0.3 mg into the muscle as needed for  anaphylaxis (difficulty breathing, wheezing, tongue swelling, difficulty swallowing or facial swelling). 04/25/21   Lurline Idol, FNP  pantoprazole (PROTONIX) 40 MG tablet Take 1 tablet (40 mg total) by mouth daily. 03/23/23   Sabino Dick, DO  sucralfate (CARAFATE) 1 g tablet Take 1 tablet (1 g total) by mouth 4 (four) times daily -  with meals and at bedtime. 03/23/23   Sabino Dick, DO  SUMAtriptan (IMITREX) 100 MG tablet Take 1 tablet (100 mg total) by mouth once as needed for up to 1 dose for migraine. May repeat in 2 hours if headache persists or recurs. 03/23/23   Sabino Dick, DO  terbinafine (LAMISIL) 1 % cream Apply 1 application topically 2 (two) times daily. 11/26/21   Jerre Simon, MD  topiramate (TOPAMAX) 100 MG tablet Take 1 tablet (100 mg total) by mouth 2 (two) times daily. 03/23/23   Sabino Dick, DO  venlafaxine (EFFEXOR) 75 MG tablet Take 1 tablet (75 mg total) by mouth 2 (two) times daily. Take 1/2 tablet (37.5 mg) for 1 week. Then increase to 1 tablet (75 mg) daily. Patient not taking: Reported on 01/15/2023 12/31/22 01/30/23  Sabino Dick, DO      Allergies    Bactrim [sulfamethoxazole-trimethoprim], Ibuprofen, Lactose intolerance (gi), Penicillins, and Oxycodone-acetaminophen    Review of Systems   Review of Systems  Neurological:  Positive for headaches.  All other systems reviewed and are negative.   Physical Exam Updated Vital Signs BP (!) 136/104 (BP Location: Right Arm)  Pulse 96   Temp 98.7 F (37.1 C)   Resp 19   Ht 5\' 1"  (1.549 m)   Wt 133.7 kg   LMP 06/12/2023 (Exact Date)   SpO2 99%   BMI 55.69 kg/m  Physical Exam Vitals and nursing note reviewed.  Constitutional:      General: She is not in acute distress.    Appearance: She is well-developed. She is obese.  HENT:     Head: Normocephalic and atraumatic.     Mouth/Throat:     Mouth: Mucous membranes are moist.     Pharynx: Oropharynx is clear.  Eyes:      General: No visual field deficit.    Extraocular Movements: Extraocular movements intact.     Conjunctiva/sclera: Conjunctivae normal.     Pupils: Pupils are equal, round, and reactive to light.  Neck:     Meningeal: Brudzinski's sign and Kernig's sign absent.  Cardiovascular:     Rate and Rhythm: Normal rate and regular rhythm.     Heart sounds: Normal heart sounds.  Pulmonary:     Effort: Pulmonary effort is normal.  Abdominal:     Palpations: Abdomen is soft.  Musculoskeletal:     Cervical back: Normal range of motion and neck supple. No rigidity.  Lymphadenopathy:     Cervical: No cervical adenopathy.  Skin:    General: Skin is warm.     Findings: No rash.  Neurological:     Mental Status: She is alert and oriented to person, place, and time.     GCS: GCS eye subscore is 4. GCS verbal subscore is 5. GCS motor subscore is 6.     Cranial Nerves: No cranial nerve deficit, dysarthria or facial asymmetry.     Sensory: No sensory deficit.     Motor: No weakness.     Coordination: Romberg sign negative.     Deep Tendon Reflexes: Reflexes normal.  Psychiatric:        Mood and Affect: Mood normal.     ED Results / Procedures / Treatments   Labs (all labs ordered are listed, but only abnormal results are displayed) Labs Reviewed - No data to display  EKG EKG Interpretation Date/Time:  Saturday July 11 2023 18:13:08 EDT Ventricular Rate:  91 PR Interval:  156 QRS Duration:  70 QT Interval:  344 QTC Calculation: 423 R Axis:   37  Text Interpretation: Normal sinus rhythm Low voltage QRS Borderline ECG When compared with ECG of 17-Jan-2023 11:50, PREVIOUS ECG IS PRESENT No acute changes Confirmed by Derwood Kaplan [phone]) on 07/11/2023 7:37:08 PM  Radiology CT Head Wo Contrast  Result Date: 07/11/2023 CLINICAL DATA:  Headache, intracranial hypertension features, history of Chiari malformation EXAM: CT HEAD WITHOUT CONTRAST TECHNIQUE: Contiguous axial images were obtained  from the base of the skull through the vertex without intravenous contrast. RADIATION DOSE REDUCTION: This exam was performed according to the departmental dose-optimization program which includes automated exposure control, adjustment of the mA and/or kV according to patient size and/or use of iterative reconstruction technique. COMPARISON:  MRI head 10/07/2020 and report from CT head 06/20/2013 FINDINGS: Brain: No intracranial hemorrhage, mass effect, or evidence of acute infarct. No hydrocephalus. No extra-axial fluid collection. The cerebellar tonsils appear to herniate through the foramen magnum though evaluation is limited by streak artifact at the skull base. Vascular: No hyperdense vessel or unexpected calcification. Skull: No fracture or focal lesion. Sinuses/Orbits: No acute finding. Paranasal sinuses and mastoid air cells are well aerated. Other: None.  IMPRESSION: 1. No acute intracranial abnormality. 2. The cerebellar tonsils appear to herniate through the foramen magnum compatible with Chiari malformation though evaluation is limited by streak artifact at the skull base. Electronically Signed   By: Minerva Fester M.D.   On: 07/11/2023 20:11    Procedures Procedures    Medications Ordered in ED Medications - No data to display  ED Course/ Medical Decision Making/ A&P                             Medical Decision Making Amount and/or Complexity of Data Reviewed Labs: ordered. Radiology: ordered.   BP (!) 136/104 (BP Location: Right Arm)   Pulse 96   Temp 98.7 F (37.1 C)   Resp 19   Ht 5\' 1"  (1.549 m)   Wt 133.7 kg   LMP 06/12/2023 (Exact Date)   SpO2 99%   BMI 55.69 kg/m   2:32 PM  42 year old female significant history of chronic migraine, Chiari malformation type I, hypertension, presenting with complaints of headache.  Patient report for the past 3 weeks she has had progressive worsening headache.  She described headache as a pressure sensation primarily to the left  side of her head from the back of her head radiates towards her forehead with pressure behind her eyes.  She also now noticing some blurry vision and having some trouble with her speech.  She mention headache that was similar to her headache related to her Chiari malformation in the past requiring lumbar puncture to remove fluid.  The last time she had it done was several years past.  She mention she recently had an evaluation at the urgent care center for her headache and had a COVID test that came back negative.  She does not think this is related to an infection.  She also felt this is different from her usual migraine headache.  She tries taking medication including Topamax, and sumatriptan which minimal relief.  No fever chills runny nose sneezing coughing shortness of breath focal numbness or focal weakness or rash.  On exam this is a well-appearing obese female talking on the phone appears to be in no acute discomfort.  She does not have any focal neurodeficit on exam.  No nuchal rigidity concerning for meningitis and she does not have any light or sound sensitivity that I can reproduce.  She has equal strength throughout.  No concerning rash.  Vital signs reviewed and overall reassuring no fever no hypoxia no abnormal blood pressure.   -Labs considered including CBC, BMP, preg test but pt declined -The patient was maintained on a cardiac monitor.  I personally viewed and interpreted the cardiac monitored which showed an underlying rhythm of: nSR -Imaging independently viewed and interpreted by me and I agree with radiologist's interpretation.  Result remarkable for head CT showing the cerebellar tonsils appears to herniate through the foramen magnum compatible with Chiari malformation.    -This patient presents to the ED for concern of headache, this involves an extensive number of treatment options, and is a complaint that carries with it a high risk of complications and morbidity.  The  differential diagnosis includes chiari malformation, tension headache, migraine headache, clustered headaches, meningitis, viral illness, occipital neuralgia -Co morbidities that complicate the patient evaluation includes chiari malformation -Treatment offered including caffeine drip or migraine cocktail, pt declined -Reevaluation of the patient after these medicines showed that the patient stayed the same -PCP office notes or outside  notes reviewed -Discussion with specialist neurosurgery team who recommend close outpt f/u for intervention, which may include a shunt.  Office will call to schedule. -Escalation to admission/observation considered: patients feels much better, is comfortable with discharge, and will follow up with neurosurgery -Prescription medication considered, patient comfortable with fioricet -Social Determinant of Health considered which includes tobacco use, food insecurity  8:53 PM Patient will follow-up outpatient with [place] [facility] for further management of her Chiari malformation causing headache.  I did offer patient supportive care and symptomatic treatment which include migraine cocktail, oral IV caffeine drip but at this time patient prefers p.o. medication and to go home.  She understands she can return promptly if condition worsen.  She is stable for discharge.         Final Clinical Impression(s) / ED Diagnoses Final diagnoses:  Chiari malformation type I (HCC)  Bad headache    Rx / DC Orders ED Discharge Orders          Ordered    butalbital-acetaminophen-caffeine (FIORICET) 50-325-40 MG tablet  Every 6 hours PRN        07/11/23 2055              Fayrene Helper, PA-C 07/11/23 2055    Derwood Kaplan, MD 07/12/23 1310

## 2023-07-11 NOTE — ED Notes (Signed)
Pt d/c home per MD order. Discharge summary reviewed, pt verbalizes understanding. Ambulatory off unit. No s/s of acute distress noted at discharge.  

## 2023-07-11 NOTE — ED Triage Notes (Signed)
The pt has had a headache for 3 weeks the headache has been worse since Monday  no temp  lmp now

## 2023-07-11 NOTE — ED Notes (Signed)
Visual acuity : OD: 20/32 OS 20/16 OU: 20/25

## 2023-08-11 ENCOUNTER — Telehealth: Payer: Self-pay

## 2023-08-11 NOTE — Telephone Encounter (Signed)
Reached out to patient to schedule surgery with Dr. Briscoe Deutscher. Call was forwarded to voicemail. I left a message requesting patient to call back to schedule at [phone].

## 2023-08-12 ENCOUNTER — Telehealth: Payer: Self-pay

## 2023-08-12 NOTE — Telephone Encounter (Signed)
Reached out to patient in attempt to schedule her procedure with Dr. Briscoe Deutscher. Left a second voicemail asking patient to call me at [phone].

## 2023-08-14 DIAGNOSIS — M6530 Trigger finger, unspecified finger: Secondary | ICD-10-CM | POA: Insufficient documentation

## 2023-08-22 ENCOUNTER — Other Ambulatory Visit: Payer: Self-pay

## 2023-08-22 ENCOUNTER — Emergency Department (HOSPITAL_COMMUNITY)
Admission: EM | Admit: 2023-08-22 | Discharge: 2023-08-22 | Disposition: A | Discharge status: Home / Self Care | Payer: Medicare Other | Attending: Emergency Medicine | Admitting: Emergency Medicine

## 2023-08-22 ENCOUNTER — Emergency Department (HOSPITAL_COMMUNITY): Payer: Medicare Other

## 2023-08-22 DIAGNOSIS — S3991XA Unspecified injury of abdomen, initial encounter: Secondary | ICD-10-CM | POA: Insufficient documentation

## 2023-08-22 DIAGNOSIS — R519 Headache, unspecified: Secondary | ICD-10-CM | POA: Insufficient documentation

## 2023-08-22 DIAGNOSIS — Y92410 Unspecified street and highway as the place of occurrence of the external cause: Secondary | ICD-10-CM | POA: Insufficient documentation

## 2023-08-22 DIAGNOSIS — N83202 Unspecified ovarian cyst, left side: Secondary | ICD-10-CM | POA: Diagnosis not present

## 2023-08-22 LAB — CBC
HCT: 32.4 % — ABNORMAL LOW (ref 36.0–46.0)
Hemoglobin: 9.7 g/dL — ABNORMAL LOW (ref 12.0–15.0)
MCH: 23 pg — ABNORMAL LOW (ref 26.0–34.0)
MCHC: 29.9 g/dL — ABNORMAL LOW (ref 30.0–36.0)
MCV: 77 fL — ABNORMAL LOW (ref 80.0–100.0)
Platelets: 461 10*3/uL — ABNORMAL HIGH (ref 150–400)
RBC: 4.21 MIL/uL (ref 3.87–5.11)
RDW: 18.4 % — ABNORMAL HIGH (ref 11.5–15.5)
WBC: 10.5 10*3/uL (ref 4.0–10.5)
nRBC: 0 % (ref 0.0–0.2)

## 2023-08-22 LAB — HEPATIC FUNCTION PANEL
ALT: 13 U/L (ref 0–44)
AST: 14 U/L — ABNORMAL LOW (ref 15–41)
Albumin: 3.2 g/dL — ABNORMAL LOW (ref 3.5–5.0)
Alkaline Phosphatase: 59 U/L (ref 38–126)
Bilirubin, Direct: <0.1 mg/dL (ref 0.0–0.2)
Total Bilirubin: 0.4 mg/dL (ref 0.3–1.2)
Total Protein: 6.6 g/dL (ref 6.5–8.1)

## 2023-08-22 LAB — BASIC METABOLIC PANEL
Anion gap: 10 (ref 5–15)
BUN: 8 mg/dL (ref 6–20)
CO2: 23 mmol/L (ref 22–32)
Calcium: 9.3 mg/dL (ref 8.9–10.3)
Chloride: 107 mmol/L (ref 98–111)
Creatinine, Ser: 1.04 mg/dL — ABNORMAL HIGH (ref 0.44–1.00)
GFR, Estimated: >60 mL/min (ref >60)
Glucose, Bld: 72 mg/dL (ref 70–99)
Potassium: 3.6 mmol/L (ref 3.5–5.1)
Sodium: 140 mmol/L (ref 135–145)

## 2023-08-22 MED ORDER — ACETAMINOPHEN 500 MG PO TABS
1000.0000 mg | ORAL_TABLET | Freq: Once | ORAL | Status: AC
  Administered 2023-08-22: 1000 mg via ORAL
  Filled 2023-08-22: qty 2

## 2023-08-22 MED ORDER — IOHEXOL 350 MG/ML SOLN
75.0000 mL | Freq: Once | INTRAVENOUS | Status: AC | PRN
  Administered 2023-08-22: 75 mL via INTRAVENOUS
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
## 2023-08-22 NOTE — Discharge Instructions (Addendum)
Your CT scans did not show any significant trauma inside your abdomen, brain, or spine.  However if your abdominal pain does not improve or if at any point it worsens, you should return to the ER, especially in the next 24 hours.  Otherwise, take Tylenol to help with pain.  Your CT scan shows an ovarian cyst but it appears to be benign and does not need any specific follow-up.  It is on your left ovary.

## 2023-08-22 NOTE — ED Notes (Signed)
Lab notified of hepatic function add-on.
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
## 2023-08-22 NOTE — ED Notes (Signed)
Pt verbalized understanding of discharge instructions. Opportunity for questions provided.  

## 2023-08-22 NOTE — ED Notes (Signed)
ED Provider at bedside. 

## 2023-09-09 ENCOUNTER — Ambulatory Visit: Payer: Medicaid Other | Admitting: Sports Medicine

## 2023-09-11 ENCOUNTER — Telehealth: Payer: Self-pay

## 2023-09-11 NOTE — Telephone Encounter (Signed)
Patient called after viewing the Mychart message. Patient agreed to have surgery on 10/28/23 @[facility] w/ Dr. Briscoe Deutscher at 7:30 am. Patient is aware she must arrive at 5:30 am. Provided pre-op instructions over the phone. Patient confirmed understanding of details.

## 2023-09-15 ENCOUNTER — Telehealth: Payer: Self-pay | Admitting: General Practice

## 2023-09-15 NOTE — Telephone Encounter (Signed)
Called patient and discussed coming in to sign hysterectomy statement form. Patient verbalized understanding and states she will come this afternoon around 230.

## 2023-09-15 NOTE — Telephone Encounter (Signed)
-----   Message from Donne Hazel sent at 09/14/2023 10:31 AM EDT ----- Regarding: RE: ##Surgery 10/28/23## Good morning,  Please have the patient sign the Hysterectomy consent form as soon as possible. Patient is scheduled for surgery on 10/28/23. Form must be completed 30 days prior to surgery.  Thanks, Dejuana ----- Message ----- From: Olevia Bowens Sent: 09/14/2023   8:41 AM EDT To: Donne Hazel Subject: RE: ##Surgery 10/28/23##                       I don't see a hysterectomy statement on this patient. Did you reach out to the office? ----- Message ----- From: Donne Hazel Sent: 09/11/2023  11:04 AM EDT To: Olevia Bowens; Oleh Genin; # Subject: ##Surgery 10/28/23##                           Done. Posted for 10/28/23 @[facility] w/ Dr. Briscoe Deutscher & Dr. Berton Lan at 0730. [id number], [id number], [id number] ----- Message ----- From: Lorriane Shire, MD Sent: 06/16/2023   4:04 PM EDT To: Donne Hazel Subject: schedule surgery                               Hello,  I would like to schedule a robotic- assisted total laparoscopic hysterectomy, bilateral salpingectomy, cystoscopy with assist for AUB. Thank you,  Ajewole

## 2023-09-17 ENCOUNTER — Telehealth: Payer: Self-pay

## 2023-09-17 NOTE — Telephone Encounter (Signed)
Patient called to cancel her 10/28/23 procedure w/ Dr. Briscoe Deutscher. Patient states she does not have short term disability at this time, and can't afford to take time off from work.

## 2023-10-21 DIAGNOSIS — I820 Budd-Chiari syndrome: Secondary | ICD-10-CM | POA: Insufficient documentation

## 2023-10-21 DIAGNOSIS — M5416 Radiculopathy, lumbar region: Secondary | ICD-10-CM | POA: Insufficient documentation

## 2023-10-21 DIAGNOSIS — M5126 Other intervertebral disc displacement, lumbar region: Secondary | ICD-10-CM | POA: Insufficient documentation

## 2023-10-26 ENCOUNTER — Institutional Professional Consult (permissible substitution): Payer: Medicaid Other | Admitting: Neurology

## 2023-10-26 ENCOUNTER — Encounter: Payer: Self-pay | Admitting: Neurology

## 2023-10-28 ENCOUNTER — Ambulatory Visit (HOSPITAL_COMMUNITY): Admit: 2023-10-28 | Payer: No Typology Code available for payment source | Admitting: Obstetrics and Gynecology

## 2023-10-28 SURGERY — XI ROBOTIC ASSISTED TOTAL HYSTERECTOMY WITH BILATERAL SALPINGO OOPHORECTOMY
Anesthesia: Choice

## 2023-12-04 ENCOUNTER — Emergency Department (HOSPITAL_COMMUNITY)
Admission: EM | Admit: 2023-12-04 | Discharge: 2023-12-04 | Disposition: A | Discharge status: Home / Self Care | Payer: Medicaid Other | Attending: Emergency Medicine | Admitting: Emergency Medicine

## 2023-12-04 ENCOUNTER — Emergency Department (HOSPITAL_COMMUNITY): Payer: Medicaid Other

## 2023-12-04 ENCOUNTER — Encounter (HOSPITAL_COMMUNITY): Payer: Self-pay

## 2023-12-04 ENCOUNTER — Other Ambulatory Visit: Payer: Self-pay

## 2023-12-04 DIAGNOSIS — R0602 Shortness of breath: Secondary | ICD-10-CM | POA: Diagnosis not present

## 2023-12-04 DIAGNOSIS — R079 Chest pain, unspecified: Secondary | ICD-10-CM | POA: Diagnosis present

## 2023-12-04 DIAGNOSIS — R091 Pleurisy: Secondary | ICD-10-CM | POA: Diagnosis not present

## 2023-12-04 DIAGNOSIS — R110 Nausea: Secondary | ICD-10-CM | POA: Diagnosis not present

## 2023-12-04 DIAGNOSIS — I10 Essential (primary) hypertension: Secondary | ICD-10-CM | POA: Insufficient documentation

## 2023-12-04 DIAGNOSIS — E119 Type 2 diabetes mellitus without complications: Secondary | ICD-10-CM | POA: Diagnosis not present

## 2023-12-04 LAB — BASIC METABOLIC PANEL
Anion gap: 9 (ref 5–15)
BUN: 9 mg/dL (ref 6–20)
CO2: 20 mmol/L — ABNORMAL LOW (ref 22–32)
Calcium: 9.5 mg/dL (ref 8.9–10.3)
Chloride: 110 mmol/L (ref 98–111)
Creatinine, Ser: 0.83 mg/dL (ref 0.44–1.00)
GFR, Estimated: >60 mL/min (ref >60)
Glucose, Bld: 75 mg/dL (ref 70–99)
Potassium: 3.8 mmol/L (ref 3.5–5.1)
Sodium: 139 mmol/L (ref 135–145)

## 2023-12-04 LAB — CBC
HCT: 32.7 % — ABNORMAL LOW (ref 36.0–46.0)
Hemoglobin: 9.8 g/dL — ABNORMAL LOW (ref 12.0–15.0)
MCH: 23.1 pg — ABNORMAL LOW (ref 26.0–34.0)
MCHC: 30 g/dL (ref 30.0–36.0)
MCV: 76.9 fL — ABNORMAL LOW (ref 80.0–100.0)
Platelets: 419 10*3/uL — ABNORMAL HIGH (ref 150–400)
RBC: 4.25 MIL/uL (ref 3.87–5.11)
RDW: 17.8 % — ABNORMAL HIGH (ref 11.5–15.5)
WBC: 10.8 10*3/uL — ABNORMAL HIGH (ref 4.0–10.5)
nRBC: 0 % (ref 0.0–0.2)

## 2023-12-04 LAB — HCG, SERUM, QUALITATIVE: Preg, Serum: NEGATIVE

## 2023-12-04 LAB — TROPONIN I (HIGH SENSITIVITY)
Troponin I (High Sensitivity): <2 ng/L (ref <18)
Troponin I (High Sensitivity): <2 ng/L (ref <18)

## 2023-12-04 LAB — D-DIMER, QUANTITATIVE: D-Dimer, Quant: <0.27 ug{FEU}/mL (ref 0.00–0.50)

## 2023-12-04 MED ORDER — ALBUTEROL SULFATE HFA 108 (90 BASE) MCG/ACT IN AERS
2.0000 | INHALATION_SPRAY | Freq: Once | RESPIRATORY_TRACT | Status: AC
  Administered 2023-12-04: 2 via RESPIRATORY_TRACT
  Filled 2023-12-04: qty 6.7

## 2023-12-04 MED ORDER — ALBUTEROL SULFATE HFA 108 (90 BASE) MCG/ACT IN AERS: 1.0000 | INHALATION_SPRAY | Freq: Four times a day (QID) | RESPIRATORY_TRACT | 0 refills | Status: AC | PRN

## 2023-12-04 NOTE — ED Provider Notes (Signed)
[place] EMERGENCY DEPARTMENT AT [facility] Provider Note   CSN: [id number] Arrival date & time: 12/04/23  1114     History {Add pertinent medical, surgical, social history, OB history to HPI:1} Chief Complaint  Patient presents with   Chest Pain    Melinda Ruiz is a 42 y.o. female.   Chest Pain      Home Medications Prior to Admission medications   Medication Sig Start Date End Date Taking? Authorizing Provider  acetaminophen (TYLENOL) 500 MG tablet Take 500 mg by mouth every 6 (six) hours as needed.    [provider]  butalbital-acetaminophen-caffeine (FIORICET) [phone] MG tablet Take 1-2 tablets by mouth every 6 (six) hours as needed for headache. 07/11/23 07/10/24  Fayrene Helper, PA-C  diclofenac Sodium (VOLTAREN) 1 % GEL Apply 2 g topically 4 (four) times daily as needed (pain). 01/15/23   Sabino Dick, DO  EPINEPHrine 0.3 mg/0.3 mL IJ SOAJ injection Inject 0.3 mg into the muscle as needed for anaphylaxis (difficulty breathing, wheezing, tongue swelling, difficulty swallowing or facial swelling). 04/25/21   Lurline Idol, FNP  pantoprazole (PROTONIX) 40 MG tablet Take 1 tablet (40 mg total) by mouth daily. 03/23/23   Sabino Dick, DO  sucralfate (CARAFATE) 1 g tablet Take 1 tablet (1 g total) by mouth 4 (four) times daily -  with meals and at bedtime. 03/23/23   Sabino Dick, DO  SUMAtriptan (IMITREX) 100 MG tablet Take 1 tablet (100 mg total) by mouth once as needed for up to 1 dose for migraine. May repeat in 2 hours if headache persists or recurs. 03/23/23   Sabino Dick, DO  terbinafine (LAMISIL) 1 % cream Apply 1 application topically 2 (two) times daily. 11/26/21   Jerre Simon, MD  topiramate (TOPAMAX) 100 MG tablet Take 1 tablet (100 mg total) by mouth 2 (two) times daily. 03/23/23   Sabino Dick, DO  venlafaxine (EFFEXOR) 75 MG tablet Take 1 tablet (75 mg total) by mouth 2 (two) times daily. Take 1/2 tablet  (37.5 mg) for 1 week. Then increase to 1 tablet (75 mg) daily. Patient not taking: Reported on 01/15/2023 12/31/22 01/30/23  Sabino Dick, DO      Allergies    Bactrim [sulfamethoxazole-trimethoprim], Ibuprofen, Lactose intolerance (gi), Penicillins, and Oxycodone-acetaminophen    Review of Systems   Review of Systems  Cardiovascular:  Positive for chest pain.    Physical Exam Updated Vital Signs BP 125/71   Pulse 75   Temp 98.5 F (36.9 C)   Resp 16   Ht 5' (1.524 m)   Wt 130.2 kg   LMP 11/12/2023 (Exact Date)   SpO2 100%   BMI 56.05 kg/m  Physical Exam  ED Results / Procedures / Treatments   Labs (all labs ordered are listed, but only abnormal results are displayed) Labs Reviewed  BASIC METABOLIC PANEL - Abnormal; Notable for the following components:      Result Value   CO2 20 (*)    All other components within normal limits  CBC - Abnormal; Notable for the following components:   WBC 10.8 (*)    Hemoglobin 9.8 (*)    HCT 32.7 (*)    MCV 76.9 (*)    MCH 23.1 (*)    RDW 17.8 (*)    Platelets 419 (*)    All other components within normal limits  HCG, SERUM, QUALITATIVE  TROPONIN I (HIGH SENSITIVITY)  TROPONIN I (HIGH SENSITIVITY)    EKG None  Radiology DG Chest 2  View  Result Date: 12/04/2023 CLINICAL DATA:  Chest pain. EXAM: CHEST - 2 VIEW COMPARISON:  01/17/2023. FINDINGS: Bilateral lung fields are clear. Bilateral costophrenic angles are clear. Normal cardio-mediastinal silhouette. No acute osseous abnormalities. The soft tissues are within normal limits. IMPRESSION: No active cardiopulmonary disease. Electronically Signed   By: Jules Schick M.D.   On: 12/04/2023 13:36    Procedures Procedures  {Document cardiac monitor, telemetry assessment procedure when appropriate:1}  Medications Ordered in ED Medications - No data to display  ED Course/ Medical Decision Making/ A&P Clinical Course as of 12/04/23 1639  Fri Dec 04, 2023  1637  Hemoglobin(!): 9.8 At baseline [JR]  1638 DG Chest 2 View No pneumothorax, cardiomegaly, or focal opacities. [JR]  [id number] ED EKG Sinus rhythm.  Rate of 87.  Normal intervals.  No axis deviation.  Biphasic T wave noted in lead III, V3.  Compared to prior ECG, biphasic T wave in V3 is new.  Overall, nonischemic ECG. [JR]    Clinical Course User Index [JR] Rolla Flatten, MD   {   Click here for ABCD2, HEART and other calculatorsREFRESH Note before signing :1}                              Medical Decision Making Amount and/or Complexity of Data Reviewed Labs: ordered. Decision-making details documented in ED Course. Radiology: ordered. Decision-making details documented in ED Course. ECG/medicine tests:  Decision-making details documented in ED Course.   ***  {Document critical care time when appropriate:1} {Document review of labs and clinical decision tools ie heart score, Chads2Vasc2 etc:1}  {Document your independent review of radiology images, and any outside records:1} {Document your discussion with family members, caretakers, and with consultants:1} {Document social determinants of health affecting pt's care:1} {Document your decision making why or why not admission, treatments were needed:1} Final Clinical Impression(s) / ED Diagnoses Final diagnoses:  None    Rx / DC Orders ED Discharge Orders     None
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
## 2023-12-04 NOTE — ED Triage Notes (Signed)
Pt c/o chest pain, nausea, and shortness of breath. Pt states she has had a cough. Has taken nyquil and mucinex w/o relief.

## 2023-12-04 NOTE — Discharge Instructions (Addendum)
You had blood test to evaluate your heart to see if you had a heart attack.  These blood tests were normal.  We also performed a blood test to screen for blood clots in your lungs.  This test was negative.  Your electrolytes were normal today.  Your blood counts showed low hemoglobin, which is consistent with your known anemia.  Continue to follow-up on this with your primary care doctor.  I would encourage you to continue her smoking cessation.  You may take over-the-counter Tylenol 1000 mg every 6 hours as needed for your pain.  Be sure to take this with food.  Please return to the emergency department if you have severely worsening chest pain, worsening shortness of breath, new swelling in your legs.

## 2024-06-09 ENCOUNTER — Ambulatory Visit: Admitting: Obstetrics and Gynecology

## 2024-06-30 ENCOUNTER — Ambulatory Visit (HOSPITAL_BASED_OUTPATIENT_CLINIC_OR_DEPARTMENT_OTHER): Admitting: Family Medicine

## 2024-09-22 ENCOUNTER — Encounter (HOSPITAL_BASED_OUTPATIENT_CLINIC_OR_DEPARTMENT_OTHER): Payer: Self-pay | Admitting: Family Medicine

## 2024-09-22 ENCOUNTER — Ambulatory Visit (INDEPENDENT_AMBULATORY_CARE_PROVIDER_SITE_OTHER): Admitting: Behavioral Health

## 2024-09-22 ENCOUNTER — Ambulatory Visit (INDEPENDENT_AMBULATORY_CARE_PROVIDER_SITE_OTHER): Admitting: Family Medicine

## 2024-09-22 VITALS — BP 140/94 | HR 98 | Ht 60.0 in | Wt 298.0 lb

## 2024-09-22 DIAGNOSIS — K5909 Other constipation: Secondary | ICD-10-CM | POA: Insufficient documentation

## 2024-09-22 DIAGNOSIS — D500 Iron deficiency anemia secondary to blood loss (chronic): Secondary | ICD-10-CM | POA: Diagnosis not present

## 2024-09-22 DIAGNOSIS — Z7689 Persons encountering health services in other specified circumstances: Secondary | ICD-10-CM

## 2024-09-22 DIAGNOSIS — Z0189 Encounter for other specified special examinations: Secondary | ICD-10-CM

## 2024-09-22 DIAGNOSIS — Z1231 Encounter for screening mammogram for malignant neoplasm of breast: Secondary | ICD-10-CM

## 2024-09-22 DIAGNOSIS — R7303 Prediabetes: Secondary | ICD-10-CM

## 2024-09-22 MED ORDER — SUMATRIPTAN SUCCINATE 100 MG PO TABS: 100.0000 mg | ORAL_TABLET | Freq: Once | ORAL | 3 refills | Status: AC | PRN

## 2024-09-22 MED ORDER — PANTOPRAZOLE SODIUM 40 MG PO TBEC: 40.0000 mg | DELAYED_RELEASE_TABLET | Freq: Every day | ORAL | 1 refills | Status: AC

## 2024-09-22 MED ORDER — EPINEPHRINE 0.3 MG/0.3ML IJ SOAJ: 0.3000 mg | INTRAMUSCULAR | 0 refills | Status: AC | PRN

## 2024-09-22 MED ORDER — TERBINAFINE HCL 1 % EX CREA: 1.0000 | TOPICAL_CREAM | Freq: Two times a day (BID) | CUTANEOUS | 2 refills | Status: AC

## 2024-09-22 MED ORDER — TOPIRAMATE 100 MG PO TABS: 100.0000 mg | ORAL_TABLET | Freq: Two times a day (BID) | ORAL | 2 refills | Status: AC

## 2024-09-22 NOTE — Patient Instructions (Signed)
 [employer]- app for caloric deficit

## 2024-09-22 NOTE — Progress Notes (Signed)
 New Patient Office Visit  Subjective:   Melinda Ruiz 05-27-1981 09/22/2024  Chief Complaint  Patient presents with   Establish Care    Discussed the use of AI scribe software for clinical note transcription with the patient, who gave verbal consent to proceed.  History of Present Illness Melinda Ruiz is a 43 year old female who presents with to establish care and discuss concerns.  CONSTIPATION:  She experiences chronic constipation, typically having a bowel movement once every two weeks, with a more substantial movement once a month. She attempts to maintain hydration by drinking water  with Kool-Aid packs and consumes vegetables, though her fiber intake is suboptimal. She uses Miralax and fiber supplements like Ollie's gummies, which recently facilitated a significant bowel movement.  WEIGHT CONCERN:  Patient has hx of prediabetes.She is concerned about weight gain following her gastric sleeve surgery in 2016, attributing it to poor dietary habits and a sedentary job as a Science writer. She often feels tired and lacks energy, affecting her ability to exercise. She plans to join a gym with her fianc.  Lab Results  Component Value Date   HGBA1C 5.5 01/15/2023     ANEMIA:  She experiences very heavy periods and has a history of anemia, frequently feeling cold. She was previously advised to have a hysterectomy but lost insurance coverage. She has an upcoming OB GYN appointment to address these issues.    Lab Results  Component Value Date   WBC 10.8 (H) 12/04/2023   HGB 9.8 (L) 12/04/2023   HCT 32.7 (L) 12/04/2023   MCV 76.9 (L) 12/04/2023   PLT 419 (H) 12/04/2023    Lab Results  Component Value Date   IRON 48 12/05/2019   TIBC 328 12/05/2019   FERRITIN 52 10/02/2020     The following portions of the patient's history were reviewed and updated as appropriate: past medical history, past surgical history, family history, social history, allergies,  medications, and problem list.   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   Displacement of lumbar intervertebral disc without myelopathy 10/21/2023   Lumbar radiculopathy 10/21/2023   Acquired trigger finger 08/14/2023   Right foot pain 01/15/2023   Depressed mood 01/15/2023   Tobacco use disorder 12/31/2022   History of trichomoniasis 12/24/2022   Plantar fasciitis 12/05/2020   Tinea pedis 12/05/2020   Tachycardia 12/03/2020   Severe obesity (BMI >= 40) (HCC) 10/02/2020   Blurry vision, bilateral 10/02/2020   Carpal tunnel syndrome of left wrist 09/02/2019   Depression 01/17/2019   Chronic migraine 07/30/2017   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, considering revision bypass due to severe reflux 10/02/2015   Hypertension, benign 06/08/2013   Menorrhagia with regular cycle 06/08/2013   Past Medical History:  Diagnosis Date   Acid reflux    Anemia, during pregnancy    Bipolar disorder (HCC)    Chiari I malformation (HCC)    Complication of anesthesia    Oxygen level drops   Depression    Hypertension    Kidney stones 2010   Migraine    Osteoarthritis    Prediabetes 09/26/2021   Schizophrenia (HCC)    Sleep apnea    neg 09/2019 sleep study  Trichomoniasis    Ulcer of abdomen wall    Past Surgical History:  Procedure Laterality Date   BREATH TEK H PYLORI N/A 05/31/2015   Procedure: BREATH TEK VEAR LORA;  Surgeon: Camellia Blush, MD;  Location: [facility];  Service: General;  Laterality: N/A;   CARPAL TUNNEL RELEASE  02/03/2012   Procedure: CARPAL TUNNEL RELEASE;  Surgeon: Cordella Glendia Hutchinson, MD;  Location: [facility] OR;  Service: Orthopedics;  Laterality:  Left;  left carpal tunnel release   CARPAL TUNNEL RELEASE  01/11/2013   Procedure: CARPAL TUNNEL RELEASE;  Surgeon: Cordella Glendia Hutchinson, MD;  Location: [name] OR;  Service: Orthopedics;  Laterality: Right;  Hand Set, Small Retractors, Lead Hand   CESAREAN SECTION  2006, 2011   CHOLECYSTECTOMY  06/2011   cystectomy     GANGLION CYST EXCISION  02/03/2012   Procedure: REMOVAL GANGLION OF WRIST;  Surgeon: Cordella Glendia Hutchinson, MD;  Location: [facility] OR;  Service: Orthopedics;  Laterality: Left;  left dorsal ganglion cyst excision   LAPAROSCOPIC GASTRIC SLEEVE RESECTION N/A 10/02/2015   Procedure: LAPAROSCOPIC GASTRIC SLEEVE RESECTION;  Surgeon: Camellia Blush, MD;  Location: [facility];  Service: General;  Laterality: N/A;   Lymp Glands Removed--under arm bil     Lympectomy     MULTIPLE TOOTH EXTRACTIONS  2003   and wisdom teeth   Pilonidial Cyst Removed  1990's   TUBAL LIGATION  2011   UPPER GI ENDOSCOPY  10/02/2015   Procedure: UPPER GI ENDOSCOPY;  Surgeon: Camellia Blush, MD;  Location: [facility];  Service: General;;   Family History  Problem Relation Age of Onset   Kidney cancer Father    Diabetes Mellitus II Father    Hypertension Father    Arthritis Mother    Diabetes Mellitus II Mother    Hypertension Mother    Diabetes Mother    Anxiety disorder Mother    Anesthesia problems Neg Hx    Social History   Socioeconomic History   Marital status: Single    Spouse name: Not on file   Number of children: 2   Years of education: 12   Highest education level: High school graduate  Occupational History   Occupation: Ambulance person  Tobacco Use   Smoking status: Every Day    Current packs/day: 0.00    Types: Cigarettes    Last attempt to quit: 10/01/2009    Years since quitting: 14.9   Smokeless tobacco: Never  Vaping Use   Vaping status: Never Used  Substance and Sexual Activity   Alcohol use: Yes    Comment: Occa   Drug use: No   Sexual activity: Yes  Other Topics Concern   Not on file   Social History Narrative   Lives at home with children.   Right-handed.   No daily caffeine  use.   Social Drivers of Corporate investment banker Strain: Not on file  Food Insecurity: Food Insecurity Present (04/09/2022)   Hunger Vital Sign    Worried About Running Out of Food in the Last Year: Often true    Ran Out of Food in the Last Year: Sometimes true  Transportation Needs: No Transportation Needs (04/09/2022)   PRAPARE - Administrator, Civil Service (Medical): No    Lack of Transportation (Non-Medical): No  Physical Activity: Not on file  Stress: Not on file  Social Connections: Unknown (05/01/2022)   Received from [facility]   Social Network    Social Network: Not on file  Intimate Partner Violence: Unknown (04/02/2022)   Received from [facility]   HITS    Physically Hurt: Not on file    Insult or Talk Down To: Not on file    Threaten Physical Harm: Not on file    Scream or Curse: Not on file   Outpatient Medications Prior to Visit  Medication Sig Dispense Refill   acetaminophen  (TYLENOL ) 500 MG tablet Take 500 mg by mouth every 6 (six) hours as needed.     albuterol  (VENTOLIN  HFA) 108 (90 Base) MCG/ACT inhaler Inhale 1-2 puffs into the lungs every 6 (six) hours as needed for up to 7 days for wheezing or shortness of breath. 1 each 0   sucralfate  (CARAFATE ) 1 g tablet Take 1 tablet (1 g total) by mouth 4 (four) times daily -  with meals and at bedtime. 120 tablet 1   pantoprazole  (PROTONIX ) 40 MG tablet Take 1 tablet (40 mg total) by mouth daily. 90 tablet 1   SUMAtriptan  (IMITREX ) 100 MG tablet Take 1 tablet (100 mg total) by mouth once as needed for up to 1 dose for migraine. May repeat in 2 hours if headache persists or recurs. 12 tablet 6   terbinafine  (LAMISIL ) 1 % cream Apply 1 application topically 2 (two) times daily. 30 g 0   topiramate  (TOPAMAX ) 100 MG tablet Take 1 tablet (100 mg total) by mouth 2 (two) times daily. 60 tablet 11   diclofenac   Sodium (VOLTAREN ) 1 % GEL Apply 2 g topically 4 (four) times daily as needed (pain). (Patient not taking: Reported on 09/22/2024) 150 g 1   EPINEPHrine  0.3 mg/0.3 mL IJ SOAJ injection Inject 0.3 mg into the muscle as needed for anaphylaxis (difficulty breathing, wheezing, tongue swelling, difficulty swallowing or facial swelling). 1 each 0   venlafaxine  (EFFEXOR ) 75 MG tablet Take 1 tablet (75 mg total) by mouth 2 (two) times daily. Take 1/2 tablet (37.5 mg) for 1 week. Then increase to 1 tablet (75 mg) daily. (Patient not taking: Reported on 09/22/2024) 60 tablet 0   Facility-Administered Medications Prior to Visit  Medication Dose Route Frequency Provider Last Rate Last Admin   acetaminophen  (TYLENOL ) tablet 500 mg  500 mg Oral Once Duncan, Paula, MD       Allergies  Allergen Reactions   Bactrim  [Sulfamethoxazole -Trimethoprim ] Itching   Ibuprofen  Itching   Lactose Intolerance (Gi)    Penicillins Hives    .Has patient had a PCN reaction causing immediate rash, facial/tongue/throat swelling, SOB or lightheadedness with hypotension: No Has patient had a PCN reaction causing severe rash involving mucus membranes or skin necrosis: No Has patient had a PCN reaction that required hospitalization No Has patient had a PCN reaction occurring within the last 10 years: No If all of the above answers are NO, then may proceed with Cephalosporin use.   Oxycodone -Acetaminophen  Itching    ROS: A complete ROS was performed with pertinent positives/negatives noted in the HPI. The remainder of the ROS are negative.   Objective:   Today's Vitals   09/22/24 0230 09/22/24 1401  BP: 130/86 (!) 140/94  Pulse:  98  SpO2:  100%  Weight:  298 lb (135.2 kg)  Height:  5' (1.524 m)  PainSc:  0-No pain    GENERAL: Well-appearing, in NAD. Obese female.  SKIN: Pink, warm and dry.  Head: Normocephalic. NECK: Trachea midline. Full ROM w/o pain or tenderness.  RESPIRATORY: Chest wall symmetrical. Respirations  even and non-labored. Breath sounds clear to auscultation bilaterally.  CARDIAC:  S1, S2 present, regular rate and rhythm without murmur or gallops. Peripheral pulses 2+ bilaterally.  MSK: Muscle tone and strength appropriate for age.  NEUROLOGIC: No motor or sensory deficits. Steady, even gait. C2-C12 intact.  PSYCH/MENTAL STATUS: Alert, oriented x 3. Cooperative, appropriate mood and affect.    Health Maintenance Due  Topic Date Due   Medicare Annual Wellness (AWV)  Never done   HPV VACCINES (1 - 3-dose SCDM series) Never done   Pneumococcal Vaccine (2 of 2 - PCV) 12/03/2021   Mammogram  Never done    No results found for any visits on 09/22/24.     Assessment & Plan:  1. Encounter to establish care with new doctor (Primary) Discussed role of PCP with patient and reviewed health maintenance.  Health history, problems, and medications reviewed and refills provided.  2. Patient requested diagnostic testing Patient stated she received a message regarding imaging connect program and would like to participate.  Referral placed. - Ambulatory referral to Genetics  3. Other constipation Recommend patient continue with increased fiber, increased water  intake, and probiotic as this has improved her bowel movement frequency and consistency with starting an over-the-counter supplement.  If worsening, reach out to PCP.  4. Iron deficiency anemia due to chronic blood loss Discussed chronic blood loss likely contributing to iron deficiency anemia with menorrhagia.  Will check CBC, iron, and B12 with lab work today. - CBC with Differential/Platelet - Iron, TIBC and Ferritin Panel - Vitamin B12  5. Prediabetes History of prediabetes and possibly contributing to weight gain post gastric sleeve as well as diet and lack of exercise.  We discussed in depth ways to improve her diet as well as exercise including caloric deficit, strength training, increase clear fluids.  Will obtain A1c and CMP with  lab work today. - Comprehensive metabolic panel with GFR - Hemoglobin A1c  6. Morbid obesity (HCC) Recommend referral to medical weight management given history of gastric sleeve in the past for treatment of obesity.  Will also obtain A1c and treat pending results. - Amb Ref to Medical Weight Management - Hemoglobin A1c  7. Breast cancer screening by mammogram - MM 3D SCREENING MAMMOGRAM BILATERAL BREAST; Future   Patient to reach out to office if new, worrisome, or unresolved symptoms arise or if no improvement in patient's condition. Patient verbalized understanding and is agreeable to treatment plan. All questions answered to patient's satisfaction.    Return in about 3 months (around 12/22/2024) for ANNUAL PHYSICAL.    Thersia Schuyler Stark, [place]

## 2024-09-23 ENCOUNTER — Telehealth (HOSPITAL_BASED_OUTPATIENT_CLINIC_OR_DEPARTMENT_OTHER): Payer: Self-pay | Admitting: *Deleted

## 2024-09-23 LAB — IRON,TIBC AND FERRITIN PANEL
Ferritin: 11 ng/mL — ABNORMAL LOW (ref 15–150)
Iron Saturation: 4 % — CL (ref 15–55)
Iron: 17 ug/dL — ABNORMAL LOW (ref 27–159)
Total Iron Binding Capacity: 461 ug/dL — ABNORMAL HIGH (ref 250–450)
UIBC: 444 ug/dL — ABNORMAL HIGH (ref 131–425)

## 2024-09-23 LAB — COMPREHENSIVE METABOLIC PANEL WITH GFR
ALT: 11 IU/L (ref 0–32)
AST: 12 IU/L (ref 0–40)
Albumin: 4 g/dL (ref 3.9–4.9)
Alkaline Phosphatase: 89 IU/L (ref 41–116)
BUN/Creatinine Ratio: 18 (ref 9–23)
BUN: 14 mg/dL (ref 6–24)
Bilirubin Total: <0.2 mg/dL (ref 0.0–1.2)
CO2: 20 mmol/L (ref 20–29)
Calcium: 10.1 mg/dL (ref 8.7–10.2)
Chloride: 105 mmol/L (ref 96–106)
Creatinine, Ser: 0.8 mg/dL (ref 0.57–1.00)
Globulin, Total: 3.2 g/dL (ref 1.5–4.5)
Glucose: 72 mg/dL (ref 70–99)
Potassium: 4.6 mmol/L (ref 3.5–5.2)
Sodium: 139 mmol/L (ref 134–144)
Total Protein: 7.2 g/dL (ref 6.0–8.5)
eGFR: 95 mL/min/1.73 (ref >59)

## 2024-09-23 LAB — CBC WITH DIFFERENTIAL/PLATELET
Basophils Absolute: 0.1 x10E3/uL (ref 0.0–0.2)
Basos: 1 %
EOS (ABSOLUTE): 0.2 x10E3/uL (ref 0.0–0.4)
Eos: 2 %
Hematocrit: 31.8 % — ABNORMAL LOW (ref 34.0–46.6)
Hemoglobin: 9.3 g/dL — ABNORMAL LOW (ref 11.1–15.9)
Immature Grans (Abs): 0 x10E3/uL (ref 0.0–0.1)
Immature Granulocytes: 0 %
Lymphocytes Absolute: 3.5 x10E3/uL — ABNORMAL HIGH (ref 0.7–3.1)
Lymphs: 32 %
MCH: 21.7 pg — ABNORMAL LOW (ref 26.6–33.0)
MCHC: 29.2 g/dL — ABNORMAL LOW (ref 31.5–35.7)
MCV: 74 fL — ABNORMAL LOW (ref 79–97)
Monocytes Absolute: 0.9 x10E3/uL (ref 0.1–0.9)
Monocytes: 8 %
Neutrophils Absolute: 6.3 x10E3/uL (ref 1.4–7.0)
Neutrophils: 57 %
Platelets: 457 x10E3/uL — ABNORMAL HIGH (ref 150–450)
RBC: 4.29 x10E6/uL (ref 3.77–5.28)
RDW: 17.9 % — ABNORMAL HIGH (ref 11.7–15.4)
WBC: 11 x10E3/uL — ABNORMAL HIGH (ref 3.4–10.8)

## 2024-09-23 LAB — HEMOGLOBIN A1C
Est. average glucose Bld gHb Est-mCnc: 120 mg/dL
Hgb A1c MFr Bld: 5.8 % — ABNORMAL HIGH (ref 4.8–5.6)

## 2024-09-23 LAB — VITAMIN B12: Vitamin B-12: 397 pg/mL (ref 232–1245)

## 2024-09-23 NOTE — Telephone Encounter (Signed)
 Copied from CRM #[id number]. Topic: Clinical - Lab/Test Results >> Sep 23, 2024 11:18 AM Melinda Ruiz wrote: Reason for CRM: patient looking for her labs to be read.. concerned on what she sees

## 2024-09-23 NOTE — Telephone Encounter (Signed)
Please advise on patient labs

## 2024-09-26 ENCOUNTER — Ambulatory Visit (HOSPITAL_BASED_OUTPATIENT_CLINIC_OR_DEPARTMENT_OTHER): Payer: Self-pay | Admitting: Family Medicine

## 2024-09-26 MED ORDER — IRON (FERROUS SULFATE) 325 (65 FE) MG PO TABS: 325.0000 mg | ORAL_TABLET | ORAL | 3 refills | Status: AC

## 2024-09-26 NOTE — Telephone Encounter (Signed)
 Please see messages sent by pt about recent lab results and advise.

## 2024-09-26 NOTE — Progress Notes (Signed)
 Hi Melinda Ruiz,  Your blood count and iron panel is showing iron deficiency anemia. Your A1C is in the prediabetes range at 5.8 and can be improved with diet, exercise and medication such as Metformin. Your B12 is normal. Your electrolytes, kidney and liver function is stable. Are you currently taking an iron supplement? Please let me know as we will need to start one to improve your anemia.

## 2024-10-06 ENCOUNTER — Encounter (INDEPENDENT_AMBULATORY_CARE_PROVIDER_SITE_OTHER): Payer: Self-pay

## 2024-11-17 ENCOUNTER — Ambulatory Visit (INDEPENDENT_AMBULATORY_CARE_PROVIDER_SITE_OTHER): Payer: Self-pay | Admitting: Professional Counselor

## 2024-11-17 DIAGNOSIS — Z0389 Encounter for observation for other suspected diseases and conditions ruled out: Secondary | ICD-10-CM

## 2024-11-17 NOTE — Progress Notes (Signed)
 Patient did not show for scheduled appointment. No message.  Almarie Sprang, [facility]

## 2024-11-18 ENCOUNTER — Other Ambulatory Visit (HOSPITAL_COMMUNITY)
Admission: RE | Admit: 2024-11-18 | Discharge: 2024-11-18 | Disposition: A | Discharge status: Home / Self Care | Source: Ambulatory Visit | Attending: Obstetrics and Gynecology | Admitting: Obstetrics and Gynecology

## 2024-11-18 ENCOUNTER — Ambulatory Visit (INDEPENDENT_AMBULATORY_CARE_PROVIDER_SITE_OTHER): Admitting: Obstetrics and Gynecology

## 2024-11-18 ENCOUNTER — Telehealth: Payer: Self-pay

## 2024-11-18 ENCOUNTER — Other Ambulatory Visit: Payer: Self-pay

## 2024-11-18 VITALS — BP 126/90 | HR 93 | Wt 298.0 lb

## 2024-11-18 DIAGNOSIS — N760 Acute vaginitis: Secondary | ICD-10-CM

## 2024-11-18 DIAGNOSIS — N920 Excessive and frequent menstruation with regular cycle: Secondary | ICD-10-CM

## 2024-11-18 NOTE — Telephone Encounter (Signed)
 Dr. Jeralyn, patient will be scheduled as soon as possible.  Auth Submission: NO AUTH NEEDED Site of care: Site of care: [facility] Payer: UHC/UMR Medication & CPT/J Code(s) submitted: Venofer (Iron  Sucrose) J1756 Diagnosis Code:  Route of submission (phone, fax, portal):  Phone # Fax # Auth type: Buy/Bill PB Units/visits requested: 300mg  x 3 doses Reference number:  Approval from: 11/18/24 to 12/28/24

## 2024-11-18 NOTE — Progress Notes (Unsigned)
 GYNECOLOGY VISIT  Patient name: Melinda Ruiz MRN [id number]  Date of birth: 28-May-1981 Chief Complaint:   Surgical consult  History:  Discussed the use of AI scribe software for clinical note transcription with the patient, who gave verbal consent to proceed.  History of Present Illness Melinda Ruiz is a 43 year old female who presents with ongoing abnormal uterine bleeding and plans for a hysterectomy.  She has been experiencing abnormal uterine bleeding for four to five days, with her last menstrual period occurring about a month ago. She uses Tylenol  for pain management and is planning to reschedule her hysterectomy.  She has a history of using birth control pills but has not tried Megace or Aygestin . She is willing to try Aygestin  to manage her bleeding.  She has recently started taking iron  pills due to feeling cold more often. She has not had iron  infusions before and expresses concern about needles. She is open to fewer sessions over a longer period for the infusions.  She quit using tobacco products.  She discusses her concerns about the upcoming hysterectomy, including the recovery period and restrictions post-surgery. She recalls a twelve-week recovery period with specific restrictions on activities such as heavy lifting and sexual intercourse. She expresses concern about her veins being difficult to access for IVs during surgery.  She mentions experiencing an odor after her periods and has been using boric acid suppositories to manage it. The odor returns if she stops using them.     The following portions of the patient's history were reviewed and updated as appropriate: allergies, current medications, past family history, past medical history, past social history, past surgical history and problem list.   Health Maintenance:   Last pap     Component Value Date/Time   DIAGPAP  12/03/2020 1408    - Negative for intraepithelial lesion or malignancy (NILM)    DIAGPAP  04/02/2017 0000    NEGATIVE FOR INTRAEPITHELIAL LESIONS OR MALIGNANCY.   HPVHIGH Negative 12/03/2020 1408   ADEQPAP  12/03/2020 1408    Satisfactory for evaluation; transformation zone component PRESENT.   ADEQPAP  04/02/2017 0000    Satisfactory for evaluation  endocervical/transformation zone component PRESENT.    Health Maintenance  Topic Date Due   Medicare Annual Wellness Visit  Never done   HPV Vaccine (1 - 3-dose SCDM series) Never done   Pneumococcal Vaccine (2 of 2 - PCV) 12/03/2021   Breast Cancer Screening  Never done   COVID-19 Vaccine (3 - 2025-26 season) 08/29/2024   Flu Shot  03/28/2025*   Pap with HPV screening  12/03/2025   DTaP/Tdap/Td vaccine (2 - Td or Tdap) 12/03/2030   Hepatitis C Screening  Completed   HIV Screening  Completed   Meningitis B Vaccine  Aged Out   Hepatitis B Vaccine  Discontinued  *Topic was postponed. The date shown is not the original due date.      Review of Systems:  Pertinent items are noted in HPI. Comprehensive review of systems was otherwise negative.   Objective:  Physical Exam BP (!) 126/90   Pulse 93   Wt 298 lb (135.2 kg)   LMP 10/22/2024 (Within Days)   BMI 58.20 kg/m    Physical Exam   Labs and Imaging No results found.     Assessment & Plan:   Assessment & Plan Planned hysterectomy for abnormal uterine bleeding Scheduled for hysterectomy due to abnormal uterine bleeding. Discussed risks including bleeding, infection, and injury to abdominal organs.  Explained same-day surgery with small incisions and removal of uterus, tubes, and cervix. Ovaries will remain to preserve estrogen benefits. Discussed recovery expectations: no vaginal activity for 12 weeks, no heavy lifting for 6 weeks, and potential return to work in 4-8 weeks depending on job demands. Informed consent obtained, including discussion of blood transfusion and resuscitation preferences. - Scheduling request for hysterectomy placed - Ordered  updated ultrasound. - Discussed recovery expectations and post-operative care.  Excessive and frequent menstruation with regular cycle Experiencing excessive and frequent menstruation with regular cycles. Discussed use of Aygestin  to manage bleeding, as it is less likely to cause weight gain compared to Megace. She is open to trying Aygestin  to stop bleeding before surgery. - Prescribed Aygestin  for bleeding management.  Iron  deficiency anemia Diagnosed with iron  deficiency anemia. Currently taking oral iron  supplements but experiencing cold intolerance. Discussed option of iron  infusions for faster improvement of iron  levels, especially in preparation for surgery. She prefers iron  infusions for quicker results. Infusion center will coordinate scheduling and insurance verification. - Ordered iron  infusions.  Acute vaginitis Experiencing symptoms of acute vaginitis, possibly related to old blood or other factors. Discussed use of boric acid suppositories as a potential treatment. Awaiting results of swab to determine if antibiotics are needed. - Vaginitis swab collected - Will consider boric acid suppositories. - Will evaluate swab results for potential antibiotic treatment.    Carter Quarry, MD [facility] for [facility], [facility]

## 2024-11-21 ENCOUNTER — Ambulatory Visit: Payer: Self-pay | Admitting: Obstetrics and Gynecology

## 2024-11-21 LAB — CERVICOVAGINAL ANCILLARY ONLY
Bacterial Vaginitis (gardnerella): NEGATIVE
Candida Glabrata: NEGATIVE
Candida Vaginitis: NEGATIVE
Chlamydia: NEGATIVE
Comment: NEGATIVE
Comment: NEGATIVE
Comment: NEGATIVE
Comment: NEGATIVE
Comment: NEGATIVE
Comment: NORMAL
Neisseria Gonorrhea: NEGATIVE
Trichomonas: NEGATIVE

## 2024-11-21 MED ORDER — NORETHINDRONE ACETATE 5 MG PO TABS: 5.0000 mg | ORAL_TABLET | Freq: Every day | ORAL | 3 refills | Status: AC

## 2024-11-29 ENCOUNTER — Ambulatory Visit (HOSPITAL_COMMUNITY)

## 2024-12-01 ENCOUNTER — Ambulatory Visit

## 2024-12-01 VITALS — BP 126/85 | HR 88 | Temp 99.0°F | Resp 20 | Ht 60.0 in | Wt 302.4 lb

## 2024-12-01 DIAGNOSIS — D500 Iron deficiency anemia secondary to blood loss (chronic): Secondary | ICD-10-CM

## 2024-12-01 MED ORDER — IRON SUCROSE 300 MG IVPB - SIMPLE MED
300.0000 mg | Status: DC
  Administered 2024-12-01: 300 mg via INTRAVENOUS
  Filled 2024-12-01: qty 265

## 2024-12-01 MED ORDER — EPINEPHRINE 0.3 MG/0.3ML IJ SOAJ: 0.3000 mg | Freq: Once | INTRAMUSCULAR | Status: DC | PRN

## 2024-12-01 MED ORDER — FAMOTIDINE IN NACL 20-0.9 MG/50ML-% IV SOLN: 20.0000 mg | Freq: Once | INTRAVENOUS | Status: DC | PRN

## 2024-12-01 MED ORDER — SODIUM CHLORIDE 0.9 % IV SOLN: Freq: Once | INTRAVENOUS | Status: AC | PRN

## 2024-12-01 MED ORDER — ALBUTEROL SULFATE HFA 108 (90 BASE) MCG/ACT IN AERS: 2.0000 | INHALATION_SPRAY | Freq: Once | RESPIRATORY_TRACT | Status: DC | PRN

## 2024-12-01 MED ORDER — DIPHENHYDRAMINE HCL 50 MG/ML IJ SOLN
50.0000 mg | Freq: Once | INTRAMUSCULAR | Status: AC | PRN
  Administered 2024-12-01: 50 mg via INTRAVENOUS

## 2024-12-01 MED ORDER — METHYLPREDNISOLONE SODIUM SUCC 125 MG IJ SOLR
125.0000 mg | Freq: Once | INTRAMUSCULAR | Status: AC | PRN
  Administered 2024-12-01: 125 mg via INTRAVENOUS

## 2024-12-01 NOTE — Progress Notes (Signed)
 Diagnosis: Acute Anemia  Provider:  Lonna Coder MD  Procedure: IV Infusion  IV Type: Peripheral, IV Location: R Forearm  Venofer  (Iron  Sucrose), Dose: 300 mg  Infusion Start Time: 0913  Infusion Stop Time: 0946  Post Infusion IV Care: Observation period completed and Peripheral IV Discontinued  Discharge: Condition: Good, Destination: Home . AVS Provided  Performed by:  Donny Childes, RN     At [phone], patient complained of symptoms including chest tightness, light-headedness, and itching on neck. Emergency protocols initiated and emergency medications administered including Benadryl  50 mg IVP at 0956am, Solumedrol 125 mg IVP at 0957am, and NS 1000 mL bolus at 0955 . Vital signs stable. Blood glucose 115. Ordering provider Dr. Jeralyn  notified via  telephone call [phone], and no response. Additional orders were not given. Family/signficant other notified by patient. Medical director Lonna Coder, MD notified via secure message at [phone] . All symptoms resolved at time of discharge. Pt called for [facility] to transport home.

## 2024-12-05 ENCOUNTER — Inpatient Hospital Stay: Attending: Genetic Counselor | Admitting: Genetic Counselor

## 2024-12-05 ENCOUNTER — Inpatient Hospital Stay

## 2024-12-06 ENCOUNTER — Encounter: Payer: Self-pay | Admitting: Obstetrics and Gynecology

## 2024-12-08 ENCOUNTER — Ambulatory Visit (HOSPITAL_COMMUNITY): Admission: RE | Admit: 2024-12-08 | Discharge: 2024-12-08 | Attending: Obstetrics and Gynecology

## 2024-12-08 ENCOUNTER — Ambulatory Visit

## 2024-12-08 DIAGNOSIS — N920 Excessive and frequent menstruation with regular cycle: Secondary | ICD-10-CM | POA: Insufficient documentation

## 2024-12-09 ENCOUNTER — Encounter: Payer: Self-pay | Admitting: Obstetrics and Gynecology

## 2024-12-09 ENCOUNTER — Telehealth: Payer: Self-pay

## 2024-12-09 NOTE — Telephone Encounter (Signed)
 I called patient to discuss surgery with Dr. Jeralyn. Patient agreed to have surgery scheduled on 01/26/25 at 7:30 am at [facility]. Pre-op instructions were provided by phone.

## 2024-12-09 NOTE — Telephone Encounter (Signed)
 Called patient to let her know I had to move her surgery date. Dr. Ritchie next robot day is 02/20/25. Patient states 02/20/25 at 7:30 am works better due to her job responsibilities.

## 2024-12-15 ENCOUNTER — Ambulatory Visit

## 2025-01-02 DIAGNOSIS — Z0289 Encounter for other administrative examinations: Secondary | ICD-10-CM

## 2025-01-04 NOTE — Progress Notes (Signed)
 FLMA completed and faxed 01/04/2025 and patient notified via Mychart.   B'Aisha, RMA

## 2025-01-27 ENCOUNTER — Other Ambulatory Visit (HOSPITAL_COMMUNITY)
Admission: RE | Admit: 2025-01-27 | Discharge: 2025-01-27 | Disposition: A | Source: Ambulatory Visit | Attending: Family Medicine | Admitting: Family Medicine

## 2025-01-27 ENCOUNTER — Ambulatory Visit (HOSPITAL_BASED_OUTPATIENT_CLINIC_OR_DEPARTMENT_OTHER): Admitting: Family Medicine

## 2025-01-27 ENCOUNTER — Encounter (HOSPITAL_BASED_OUTPATIENT_CLINIC_OR_DEPARTMENT_OTHER): Payer: Self-pay | Admitting: Family Medicine

## 2025-01-27 VITALS — BP 139/84 | HR 102 | Ht 60.0 in | Wt 302.0 lb

## 2025-01-27 DIAGNOSIS — I10 Essential (primary) hypertension: Secondary | ICD-10-CM | POA: Diagnosis not present

## 2025-01-27 DIAGNOSIS — N898 Other specified noninflammatory disorders of vagina: Secondary | ICD-10-CM

## 2025-01-27 MED ORDER — FLUCONAZOLE 150 MG PO TABS: 150.0000 mg | ORAL_TABLET | Freq: Once | ORAL | 0 refills | Status: AC

## 2025-01-27 NOTE — Progress Notes (Signed)
 "     Acute Care Office Visit  Subjective:   Melinda Ruiz 05-02-1981 01/27/2025  Chief Complaint  Patient presents with   Vaginitis    Pt states she feels like she might have a yeast infection. States she did begin taking monostat which has helped some but still having a lot of itching.    HPI: Patient states she has been having symptoms of yeast vaginitis for approx. 1 week with itching. She states she had a hx of acute bronchitis at the beginning of January and did not receive an antibiotic. She states she has been given medication by her OBGYN for menorrhagia with upcoming hysterectomy and states this has stopped her period and wonders if this may have been contributing to vaginal pH changes. She did try Monistat  without significant improvement.   Patient desiring to try PREP Exercise program with [facility] as well for lifestyle changes and weight loss for improvement of chronic conditions of HTN and Prediabetes.    The following portions of the patient's history were reviewed and updated as appropriate: past medical history, past surgical history, family history, social history, allergies, medications, and problem list.   Patient Active Problem List   Diagnosis Date Noted   Iron  deficiency anemia due to chronic blood loss 09/22/2024   Other constipation 09/22/2024   Budd-Chiari syndrome (HCC) 10/21/2023   Displacement of lumbar intervertebral disc without myelopathy 10/21/2023   Lumbar radiculopathy 10/21/2023   Acquired trigger finger 08/14/2023   Right foot pain 01/15/2023   Depressed mood 01/15/2023   Tobacco use disorder 12/31/2022   History of trichomoniasis 12/24/2022   Prediabetes 09/26/2021   Plantar fasciitis 12/05/2020   Tinea pedis 12/05/2020   Tachycardia 12/03/2020   Morbid obesity (HCC) 10/02/2020   Blurry vision, bilateral 10/02/2020   Carpal tunnel syndrome of left wrist 09/02/2019   Depression 01/17/2019   Chronic migraine 07/30/2017   Chiari  malformation type I (HCC) 07/30/2017   Thyromegaly 04/09/2016   Oropharyngeal dysphagia 04/09/2016   Multiple thyroid  nodules 04/09/2016   Gastroesophageal reflux disease 04/09/2016   Tonsillolith 04/09/2016   Low HDL (under 40) 10/02/2015   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, considering revision bypass due to severe reflux 10/02/2015   Hypertension, benign 06/08/2013   Menorrhagia with regular cycle 06/08/2013   Past Medical History:  Diagnosis Date   Acid reflux    Anemia, during pregnancy    Bipolar disorder (HCC)    Chiari I malformation (HCC)    Complication of anesthesia    Oxygen level drops   Depression    Hypertension    Kidney stones 2010   Migraine    Osteoarthritis    Prediabetes 09/26/2021   Schizophrenia (HCC)    Sleep apnea    neg 09/2019 sleep study   Trichomoniasis    Ulcer of abdomen wall    Past Surgical History:  Procedure Laterality Date   BREATH TEK H PYLORI N/A 05/31/2015   Procedure: BREATH TEK H PYLORI;  Surgeon: Camellia Tanda, MD;  Location: [facility];  Service: General;  Laterality: N/A;   CARPAL TUNNEL RELEASE  02/03/2012   Procedure: CARPAL TUNNEL RELEASE;  Surgeon: Cordella Glendia Hutchinson, MD;  Location: [facility] OR;  Service: Orthopedics;  Laterality: Left;  left carpal tunnel release   CARPAL TUNNEL RELEASE  01/11/2013   Procedure: CARPAL TUNNEL RELEASE;  Surgeon: Cordella Glendia Hutchinson, MD;  Location: [facility] OR;  Service: Orthopedics;  Laterality: Right;  Hand Set, Small Retractors, Lead Hand  CESAREAN SECTION  2006, 2011   CHOLECYSTECTOMY  06/2011   cystectomy     GANGLION CYST EXCISION  02/03/2012   Procedure: REMOVAL GANGLION OF WRIST;  Surgeon: Cordella Glendia Hutchinson, MD;  Location: [facility] OR;  Service: Orthopedics;  Laterality: Left;  left dorsal ganglion cyst excision   LAPAROSCOPIC GASTRIC SLEEVE RESECTION N/A 10/02/2015   Procedure: LAPAROSCOPIC GASTRIC SLEEVE RESECTION;  Surgeon: Camellia Blush, MD;  Location: [facility] ORS;  Service: General;   Laterality: N/A;   Lymp Glands Removed--under arm bil     Lympectomy     MULTIPLE TOOTH EXTRACTIONS  2003   and wisdom teeth   Pilonidial Cyst Removed  1990's   TUBAL LIGATION  2011   UPPER GI ENDOSCOPY  10/02/2015   Procedure: UPPER GI ENDOSCOPY;  Surgeon: Camellia Blush, MD;  Location: [facility] OR[facility];  Service: General;;   Family History  Problem Relation Age of Onset   Kidney cancer Father    Diabetes Mellitus II Father    Hypertension Father    Arthritis Mother    Diabetes Mellitus II Mother    Hypertension Mother    Diabetes Mother    Anxiety disorder Mother    Anesthesia problems Neg Hx    Outpatient Medications Prior to Visit  Medication Sig Dispense Refill   acetaminophen  (TYLENOL ) 500 MG tablet Take 500 mg by mouth every 6 (six) hours as needed.     albuterol  (VENTOLIN  HFA) 108 (90 Base) MCG/ACT inhaler Inhale 1-2 puffs into the lungs every 6 (six) hours as needed for up to 7 days for wheezing or shortness of breath. 1 each 0   EPINEPHrine  0.3 mg/0.3 mL IJ SOAJ injection Inject 0.3 mg into the muscle as needed for anaphylaxis (difficulty breathing, wheezing, tongue swelling, difficulty swallowing or facial swelling). 1 each 0   Iron , Ferrous Sulfate , 325 (65 Fe) MG TABS Take 325 mg by mouth every other day. 30 tablet 3   norethindrone  (AYGESTIN ) 5 MG tablet Take 1 tablet (5 mg total) by mouth daily. 30 tablet 3   pantoprazole  (PROTONIX ) 40 MG tablet Take 1 tablet (40 mg total) by mouth daily. 90 tablet 1   sucralfate  (CARAFATE ) 1 g tablet Take 1 tablet (1 g total) by mouth 4 (four) times daily -  with meals and at bedtime. 120 tablet 1   SUMAtriptan  (IMITREX ) 100 MG tablet Take 1 tablet (100 mg total) by mouth once as needed for up to 1 dose for migraine. May repeat in 2 hours if headache persists or recurs. 12 tablet 3   terbinafine  (LAMISIL ) 1 % cream Apply 1 Application topically 2 (two) times daily. 30 g 2   topiramate  (TOPAMAX ) 100 MG tablet Take 1 tablet (100 mg total) by mouth 2  (two) times daily. 180 tablet 2   Facility-Administered Medications Prior to Visit  Medication Dose Route Frequency Provider Last Rate Last Admin   acetaminophen  (TYLENOL ) tablet 500 mg  500 mg Oral Once Cleatus Moccasin, MD       Allergies[1]   ROS: A complete ROS was performed with pertinent positives/negatives noted in the HPI. The remainder of the ROS are negative.    Objective:   Today's Vitals   01/27/25 0856  BP: 139/84  Pulse: (!) 102  SpO2: 99%  Weight: (!) 302 lb (137 kg)  Height: 5' (1.524 m)    GENERAL: Well-appearing, in NAD. Obese.  SKIN: Pink, warm and dry.  Head: Normocephalic. NECK: Trachea midline. Full ROM w/o pain or tenderness.  RESPIRATORY: Chest  wall symmetrical. Respirations even and non-labored. MSK: Muscle tone and strength appropriate for age.  GU: External genitalia without erythema, lesions, or masses. No lymphadenopathy. Vaginal mucosa pink and moist with white thick exudate, no lesion or ulcerations. Cervix pink without discharge. Cervical os closed. Uterus and adnexae palpable, not enlarged, and w/o tenderness.  Chaperoned by Damien Many, CMA.  NEUROLOGIC: No motor or sensory deficits. Steady, even gait. C2-C12 intact.  PSYCH/MENTAL STATUS: Alert, oriented x 3. Cooperative, appropriate mood and affect.    Assessment & Plan:  1. Vaginal itching (Primary) Likely yeast vaginitis. Will treat with Diflucan  and sent swab for confirmation. Pt reqeusted GC testing with swab as well and denies current STI exposure.  - Cervicovaginal ancillary only  2. Morbid obesity (HCC) 3. Hypertension, benign Discussed benefits of program with patient and requested referral. Order placed.  - Amb Referral To Provider Referral Exercise Program (P.R.E.P)   Meds ordered this encounter  Medications   fluconazole  (DIFLUCAN ) 150 MG tablet    Sig: Take 1 tablet (150 mg total) by mouth once for 1 dose. May repeat after 3 days if needed.    Dispense:  2 tablet    Refill:   0    Supervising Provider:   DE CUBA, RAYMOND J [[id number]]   Lab Orders  No laboratory test(s) ordered today    Return in about 6 months (around 07/27/2025) for ANNUAL PHYSICAL.    Patient to reach out to office if new, worrisome, or unresolved symptoms arise or if no improvement in patient's condition. Patient verbalized understanding and is agreeable to treatment plan. All questions answered to patient's satisfaction.    Melinda Sweigert Olivia Prosper Paff, FNP      [1]  Allergies Allergen Reactions   Bactrim  [Sulfamethoxazole -Trimethoprim ] Itching   Ibuprofen  Itching   Lactose Intolerance (Gi)    Penicillins Hives    .Has patient had a PCN reaction causing immediate rash, facial/tongue/throat swelling, SOB or lightheadedness with hypotension: No Has patient had a PCN reaction causing severe rash involving mucus membranes or skin necrosis: No Has patient had a PCN reaction that required hospitalization No Has patient had a PCN reaction occurring within the last 10 years: No If all of the above answers are NO, then may proceed with Cephalosporin use.   Oxycodone -Acetaminophen  Itching   Venofer  [Iron  Sucrose] Itching, Palpitations and Other (See Comments)    Chest tightness   "

## 2025-01-30 ENCOUNTER — Encounter: Payer: Self-pay | Admitting: Obstetrics and Gynecology

## 2025-01-30 ENCOUNTER — Telehealth: Payer: Self-pay

## 2025-01-30 DIAGNOSIS — N920 Excessive and frequent menstruation with regular cycle: Secondary | ICD-10-CM

## 2025-01-30 DIAGNOSIS — D500 Iron deficiency anemia secondary to blood loss (chronic): Secondary | ICD-10-CM

## 2025-01-30 NOTE — Telephone Encounter (Signed)
 Spoke to [place] on the phone. She would like to join the [facility] Prep Class. Text her a time for the initial assessment for 2/5 @ 9:30 am waiting for responds.

## 2025-02-01 ENCOUNTER — Ambulatory Visit (HOSPITAL_BASED_OUTPATIENT_CLINIC_OR_DEPARTMENT_OTHER): Payer: Self-pay | Admitting: Family Medicine

## 2025-02-01 LAB — CERVICOVAGINAL ANCILLARY ONLY
Bacterial Vaginitis (gardnerella): POSITIVE — AB
Candida Glabrata: NEGATIVE
Candida Vaginitis: NEGATIVE
Chlamydia: NEGATIVE
Comment: NEGATIVE
Comment: NEGATIVE
Comment: NEGATIVE
Comment: NEGATIVE
Comment: NEGATIVE
Comment: NORMAL
Neisseria Gonorrhea: NEGATIVE
Trichomonas: NEGATIVE

## 2025-02-01 MED ORDER — METRONIDAZOLE 500 MG PO TABS: 500.0000 mg | ORAL_TABLET | Freq: Two times a day (BID) | ORAL | 0 refills | Status: AC

## 2025-02-01 NOTE — Progress Notes (Signed)
 You have an overgrowth of bacteria in your vagina called Bacterial Vaginosis. This is not a sexually transmitted infection.  Sexual partners do not need to be treated, however abstaining from sex or using condoms may prevent recurrence of the overgrowth.  Some women have a recurrence of the overgrowth even when fully treated.  Call the office if your symptoms begin again.  Do not douche.  This is associated with decreased cure rates and more bacterial overgrowths. Take the full course of the antibiotic prescribed to you even if you begin to feel better.  Do not drink alcohol with the antibiotic flagyl  (metronidazole ) as this drug will cause nausea and severe vomiting if you drink while taking antibiotic.

## 2025-02-20 ENCOUNTER — Ambulatory Visit (HOSPITAL_COMMUNITY): Admit: 2025-02-20 | Admitting: Obstetrics and Gynecology

## 2025-02-20 SURGERY — HYSTERECTOMY, TOTAL, ROBOT-ASSISTED
Anesthesia: Choice
